# Patient Record
Sex: Female | Born: 1955 | Race: Black or African American | Hispanic: No | Marital: Single | Smoking: Former smoker
Health system: Southern US, Community
[De-identification: ages and names within clinical notes are randomized; demographics above are authoritative.]

## PROBLEM LIST (undated history)

## (undated) DIAGNOSIS — E119 Type 2 diabetes mellitus without complications: Secondary | ICD-10-CM

## (undated) DIAGNOSIS — C50919 Malignant neoplasm of unspecified site of unspecified female breast: Secondary | ICD-10-CM

## (undated) DIAGNOSIS — I639 Cerebral infarction, unspecified: Secondary | ICD-10-CM

## (undated) DIAGNOSIS — I10 Essential (primary) hypertension: Secondary | ICD-10-CM

## (undated) DIAGNOSIS — E785 Hyperlipidemia, unspecified: Secondary | ICD-10-CM

## (undated) HISTORY — PX: BREAST LUMPECTOMY: SHX2

## (undated) HISTORY — DX: Cerebral infarction, unspecified: I63.9

## (undated) HISTORY — DX: Hyperlipidemia, unspecified: E78.5

---

## 2009-07-07 ENCOUNTER — Emergency Department (HOSPITAL_BASED_OUTPATIENT_CLINIC_OR_DEPARTMENT_OTHER): Admission: EM | Admit: 2009-07-07 | Discharge: 2009-07-07 | Payer: Self-pay | Admitting: Emergency Medicine

## 2011-03-06 LAB — POCT CARDIAC MARKERS
CKMB, poc: 1.8 ng/mL (ref 1.0–8.0)
Troponin i, poc: <0.05 ng/mL (ref 0.00–0.09)

## 2011-03-06 LAB — BASIC METABOLIC PANEL
BUN: 10 mg/dL (ref 6–23)
CO2: 29 mEq/L (ref 19–32)
Chloride: 104 mEq/L (ref 96–112)
Glucose, Bld: 159 mg/dL — ABNORMAL HIGH (ref 70–99)
Potassium: 3.5 mEq/L (ref 3.5–5.1)

## 2014-09-10 ENCOUNTER — Emergency Department (HOSPITAL_BASED_OUTPATIENT_CLINIC_OR_DEPARTMENT_OTHER): Payer: Medicare Other

## 2014-09-10 ENCOUNTER — Emergency Department (HOSPITAL_BASED_OUTPATIENT_CLINIC_OR_DEPARTMENT_OTHER)
Admission: EM | Admit: 2014-09-10 | Discharge: 2014-09-10 | Disposition: A | Discharge status: Home / Self Care | Payer: Medicare Other | Attending: Emergency Medicine | Admitting: Emergency Medicine

## 2014-09-10 ENCOUNTER — Encounter (HOSPITAL_BASED_OUTPATIENT_CLINIC_OR_DEPARTMENT_OTHER): Payer: Self-pay | Admitting: Emergency Medicine

## 2014-09-10 DIAGNOSIS — E119 Type 2 diabetes mellitus without complications: Secondary | ICD-10-CM | POA: Diagnosis not present

## 2014-09-10 DIAGNOSIS — G454 Transient global amnesia: Secondary | ICD-10-CM | POA: Diagnosis not present

## 2014-09-10 DIAGNOSIS — Z853 Personal history of malignant neoplasm of breast: Secondary | ICD-10-CM | POA: Insufficient documentation

## 2014-09-10 DIAGNOSIS — G4700 Insomnia, unspecified: Secondary | ICD-10-CM | POA: Insufficient documentation

## 2014-09-10 DIAGNOSIS — R42 Dizziness and giddiness: Secondary | ICD-10-CM | POA: Diagnosis present

## 2014-09-10 DIAGNOSIS — Z87891 Personal history of nicotine dependence: Secondary | ICD-10-CM | POA: Diagnosis not present

## 2014-09-10 HISTORY — DX: Malignant neoplasm of unspecified site of unspecified female breast: C50.919

## 2014-09-10 HISTORY — DX: Type 2 diabetes mellitus without complications: E11.9

## 2014-09-10 LAB — BASIC METABOLIC PANEL
Anion gap: 19 — ABNORMAL HIGH (ref 5–15)
BUN: 14 mg/dL (ref 6–23)
CALCIUM: 10.7 mg/dL — AB (ref 8.4–10.5)
CO2: 24 meq/L (ref 19–32)
CREATININE: 0.9 mg/dL (ref 0.50–1.10)
Chloride: 98 mEq/L (ref 96–112)
GFR calc Af Amer: 80 mL/min — ABNORMAL LOW (ref >90)
GFR calc non Af Amer: 69 mL/min — ABNORMAL LOW (ref >90)
GLUCOSE: 121 mg/dL — AB (ref 70–99)
Potassium: 3.3 mEq/L — ABNORMAL LOW (ref 3.7–5.3)
Sodium: 141 mEq/L (ref 137–147)

## 2014-09-10 LAB — CBC WITH DIFFERENTIAL/PLATELET
BASOS ABS: 0 10*3/uL (ref 0.0–0.1)
Basophils Relative: 0 % (ref 0–1)
EOS PCT: 0 % (ref 0–5)
Eosinophils Absolute: 0 10*3/uL (ref 0.0–0.7)
HEMATOCRIT: 36.1 % (ref 36.0–46.0)
Hemoglobin: 12.5 g/dL (ref 12.0–15.0)
LYMPHS PCT: 24 % (ref 12–46)
Lymphs Abs: 2.8 10*3/uL (ref 0.7–4.0)
MCH: 29.1 pg (ref 26.0–34.0)
MCHC: 34.6 g/dL (ref 30.0–36.0)
MCV: 84.1 fL (ref 78.0–100.0)
MONO ABS: 0.7 10*3/uL (ref 0.1–1.0)
Monocytes Relative: 6 % (ref 3–12)
Neutro Abs: 8 10*3/uL — ABNORMAL HIGH (ref 1.7–7.7)
Neutrophils Relative %: 70 % (ref 43–77)
Platelets: 274 10*3/uL (ref 150–400)
RBC: 4.29 MIL/uL (ref 3.87–5.11)
RDW: 13.9 % (ref 11.5–15.5)
WBC: 11.6 10*3/uL — AB (ref 4.0–10.5)

## 2014-09-10 LAB — URINALYSIS, ROUTINE W REFLEX MICROSCOPIC
BILIRUBIN URINE: NEGATIVE
Glucose, UA: NEGATIVE mg/dL
HGB URINE DIPSTICK: NEGATIVE
Ketones, ur: 15 mg/dL — AB
Leukocytes, UA: NEGATIVE
Nitrite: NEGATIVE
Protein, ur: 30 mg/dL — AB
SPECIFIC GRAVITY, URINE: 1.013 (ref 1.005–1.030)
UROBILINOGEN UA: 0.2 mg/dL (ref 0.0–1.0)
pH: 6.5 (ref 5.0–8.0)

## 2014-09-10 LAB — CBG MONITORING, ED: Glucose-Capillary: 134 mg/dL — ABNORMAL HIGH (ref 70–99)

## 2014-09-10 LAB — URINE MICROSCOPIC-ADD ON

## 2014-09-10 LAB — TROPONIN I: Troponin I: <0.3 ng/mL (ref <0.30)

## 2014-09-10 IMAGING — CT CT HEAD W/O CM
1 series · 16 of 30 positions shown, 20 images · non-contrast
Comparison: None.

CLINICAL DATA: Weakness

EXAM:
CT HEAD WITHOUT CONTRAST
TECHNIQUE: Contiguous axial images were obtained from the base of the skull
through the vertex without intravenous contrast.

[Series 3: head 4.8 h37s · axial · 0.45mm/px · z∈[-141,+11]mm · 16 of 36 slices shown, 20 images]
[im 2/36  brain]
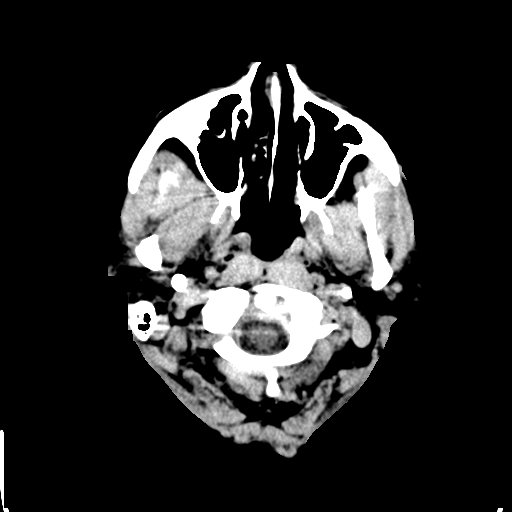
[im 2/36  bone]
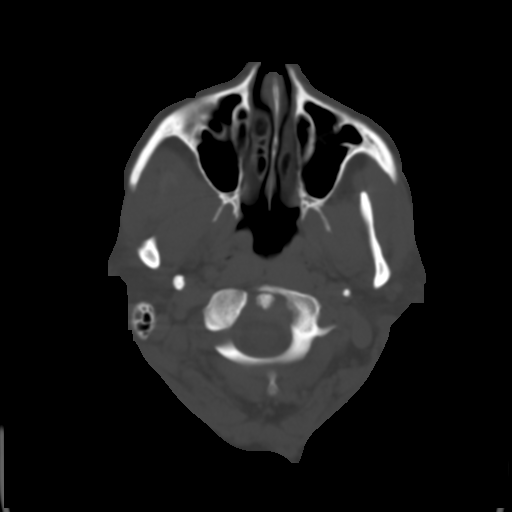
[im 4/36  brain]
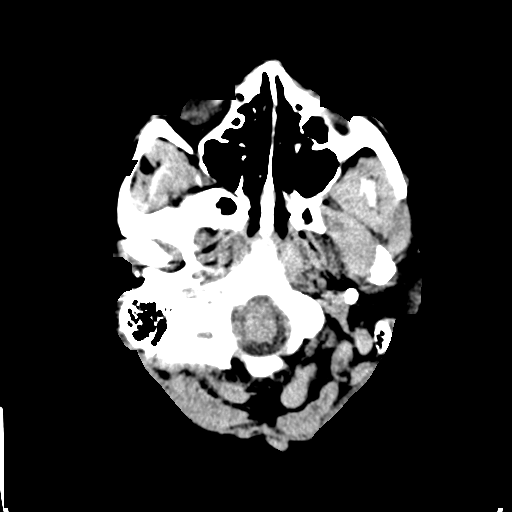
[im 7/36  brain]
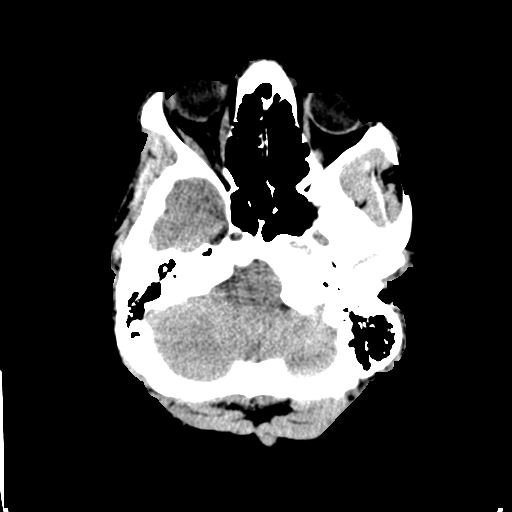
[im 9/36  brain]
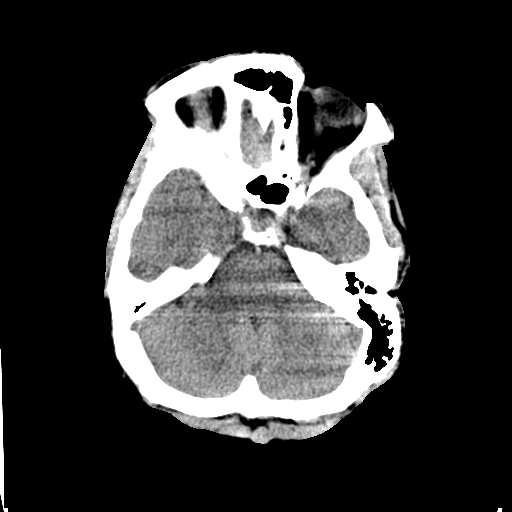
[im 10/36  brain]
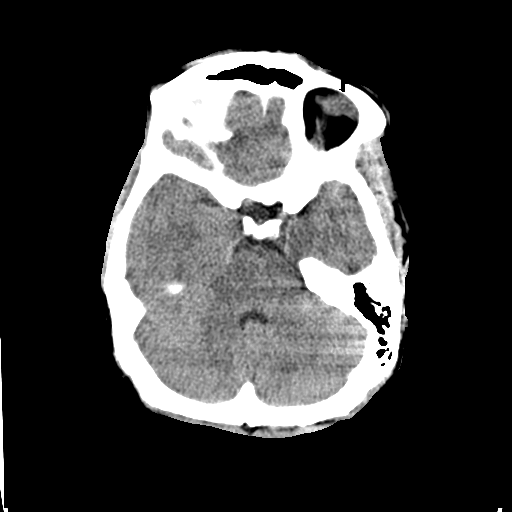
[im 10/36  bone]
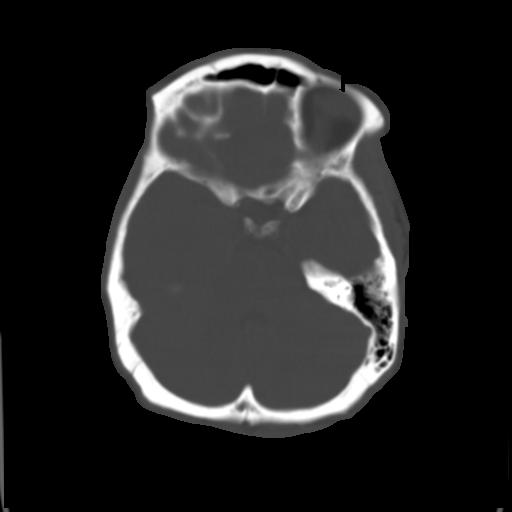
[im 13/36  brain]
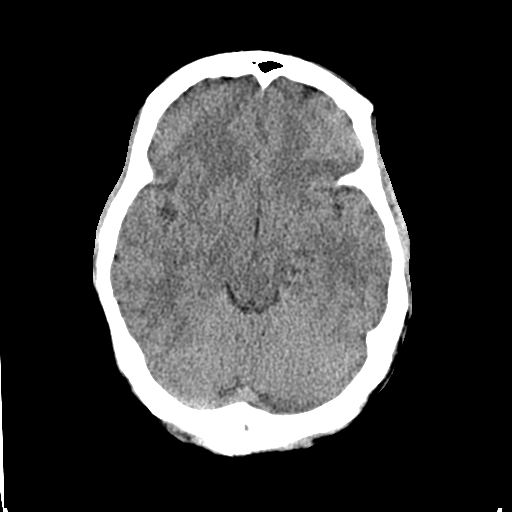
[im 15/36  brain]
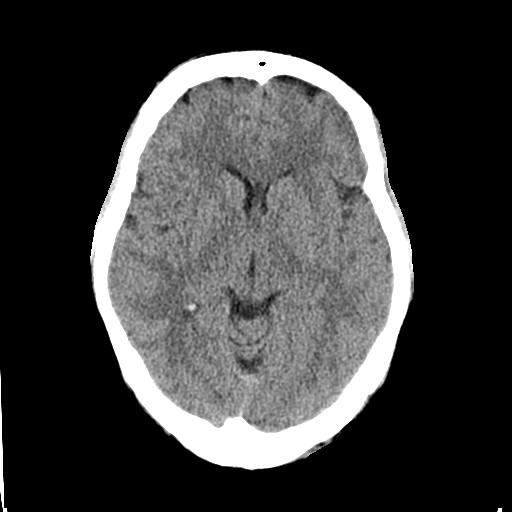
[im 17/36  brain]
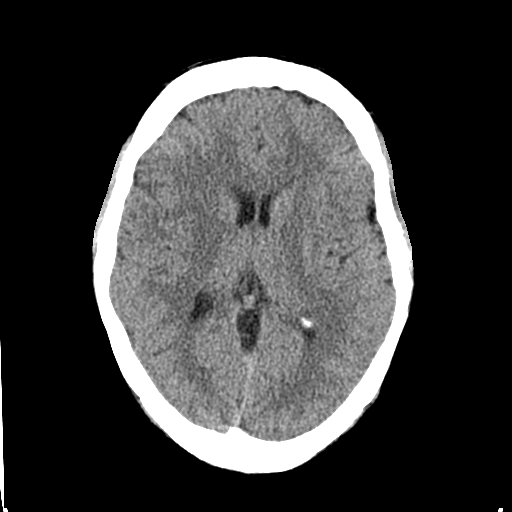
[im 19/36  brain]
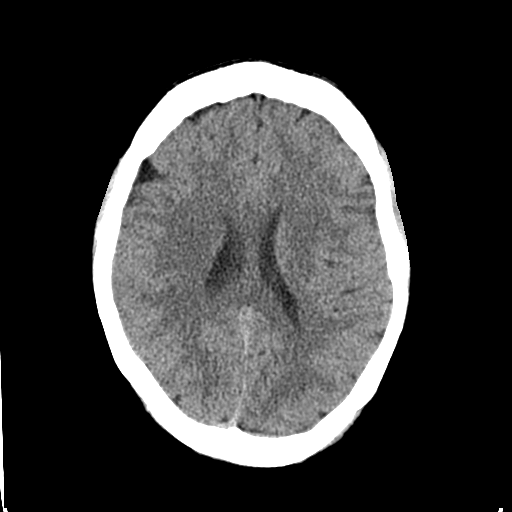
[im 19/36  bone]
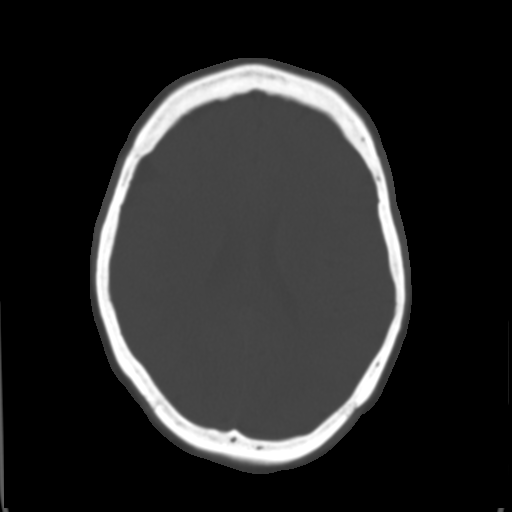
[im 21/36  brain]
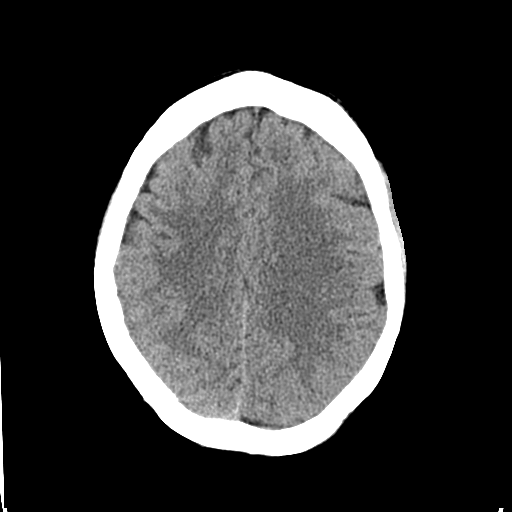
[im 23/36  brain]
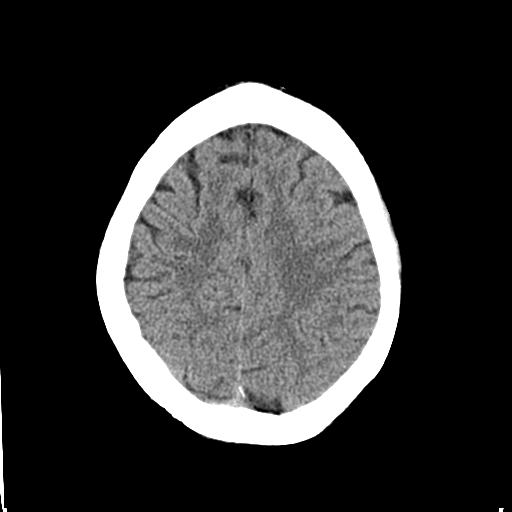
[im 26/36  brain]
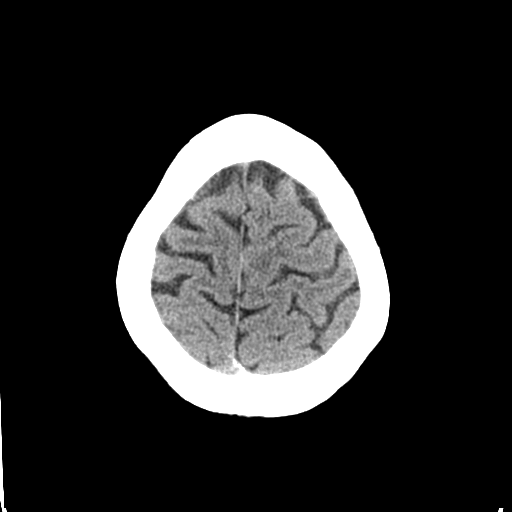
[im 27/36  brain]
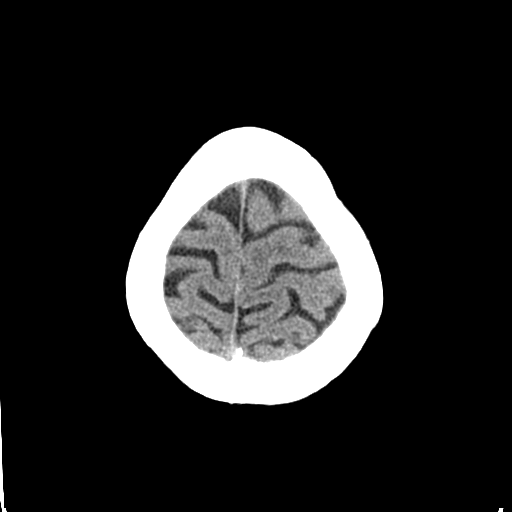
[im 27/36  bone]
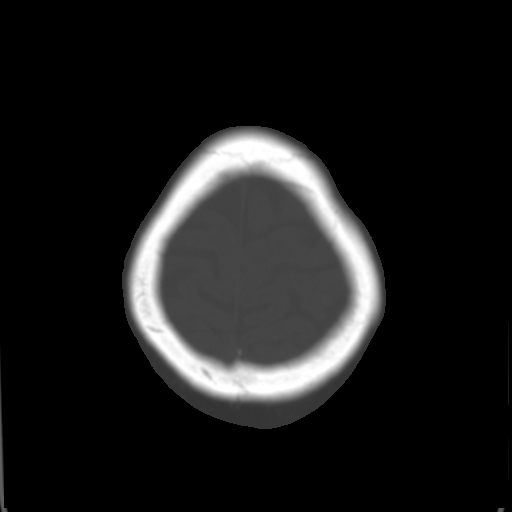
[im 29/36  brain]
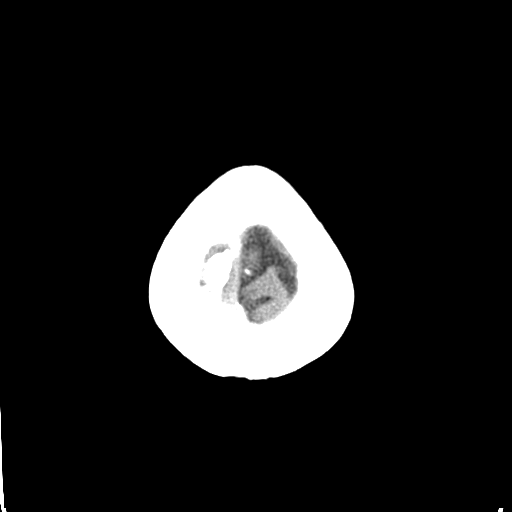
[im 32/36  brain]
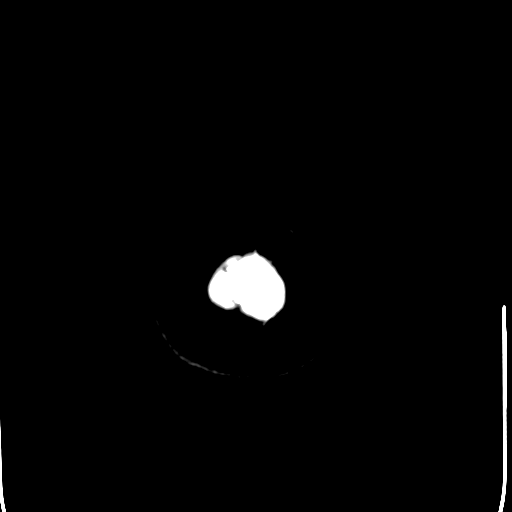
[im 34/36  brain]
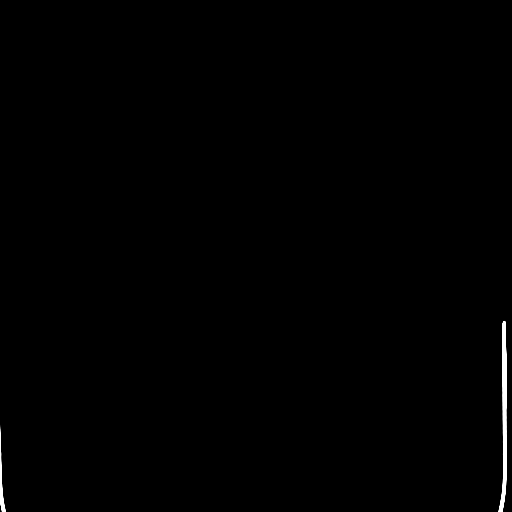

[16 of 30 positions shown; findings below may reference images not displayed]

FINDINGS: No acute cortical infarct, hemorrhage, or mass lesion is present.
The ventricles are of normal size. No significant extra-axial fluid
collection is evident. The paranasal sinuses and mastoid air cells
are clear. The osseous skull is intact.
IMPRESSION: Negative CT of the head.

## 2014-09-10 MED ORDER — ESZOPICLONE 1 MG PO TABS: 1.0000 mg | ORAL_TABLET | Freq: Every evening | ORAL | Status: DC | PRN

## 2014-09-10 NOTE — ED Notes (Addendum)
C/o "feeling woozy" x 4 days-steady gait into triage-denies pain-pt with steady gait into triage and answering all ?s

## 2014-09-10 NOTE — ED Notes (Signed)
Patient transported to CT 

## 2014-09-10 NOTE — Discharge Instructions (Signed)
°Insomnia °Insomnia is frequent trouble falling and/or staying asleep. Insomnia can be a long term problem or a short term problem. Both are common. Insomnia can be a short term problem when the wakefulness is related to a certain stress or worry. Long term insomnia is often related to ongoing stress during waking hours and/or poor sleeping habits. Overtime, sleep deprivation itself can make the problem worse. Every little thing feels more severe because you are overtired and your ability to cope is decreased. °CAUSES  °· Stress, anxiety, and depression. °· Poor sleeping habits. °· Distractions such as TV in the bedroom. °· Naps close to bedtime. °· Engaging in emotionally charged conversations before bed. °· Technical reading before sleep. °· Alcohol and other sedatives. They may make the problem worse. They can hurt normal sleep patterns and normal dream activity. °· Stimulants such as caffeine for several hours prior to bedtime. °· Pain syndromes and shortness of breath can cause insomnia. °· Exercise late at night. °· Changing time zones may cause sleeping problems (jet lag). °It is sometimes helpful to have someone observe your sleeping patterns. They should look for periods of not breathing during the night (sleep apnea). They should also look to see how long those periods last. If you live alone or observers are uncertain, you can also be observed at a sleep clinic where your sleep patterns will be professionally monitored. Sleep apnea requires a checkup and treatment. Give your caregivers your medical history. Give your caregivers observations your family has made about your sleep.  °SYMPTOMS  °· Not feeling rested in the morning. °· Anxiety and restlessness at bedtime. °· Difficulty falling and staying asleep. °TREATMENT  °· Your caregiver may prescribe treatment for an underlying medical disorders. Your caregiver can give advice or help if you are using alcohol or other drugs for self-medication.  Treatment of underlying problems will usually eliminate insomnia problems. °· Medications can be prescribed for short time use. They are generally not recommended for lengthy use. °· Over-the-counter sleep medicines are not recommended for lengthy use. They can be habit forming. °· You can promote easier sleeping by making lifestyle changes such as: °¨ Using relaxation techniques that help with breathing and reduce muscle tension. °¨ Exercising earlier in the day. °¨ Changing your diet and the time of your last meal. No night time snacks. °¨ Establish a regular time to go to bed. °· Counseling can help with stressful problems and worry. °· Soothing music and white noise may be helpful if there are background noises you cannot remove. °· Stop tedious detailed work at least one hour before bedtime. °HOME CARE INSTRUCTIONS  °· Keep a diary. Inform your caregiver about your progress. This includes any medication side effects. See your caregiver regularly. Take note of: °¨ Times when you are asleep. °¨ Times when you are awake during the night. °¨ The quality of your sleep. °¨ How you feel the next day. °This information will help your caregiver care for you. °· Get out of bed if you are still awake after 15 minutes. Read or do some quiet activity. Keep the lights down. Wait until you feel sleepy and go back to bed. °· Keep regular sleeping and waking hours. Avoid naps. °· Exercise regularly. °· Avoid distractions at bedtime. Distractions include watching television or engaging in any intense or detailed activity like attempting to balance the household checkbook. °· Develop a bedtime ritual. Keep a familiar routine of bathing, brushing your teeth, climbing into bed at the same   time each night, listening to soothing music. Routines increase the success of falling to sleep faster.  Use relaxation techniques. This can be using breathing and muscle tension release routines. It can also include visualizing peaceful scenes. You can  also help control troubling or intruding thoughts by keeping your mind occupied with boring or repetitive thoughts like the old concept of counting sheep. You can make it more creative like imagining planting one beautiful flower after another in your backyard garden.  During your day, work to eliminate stress. When this is not possible use some of the previous suggestions to help reduce the anxiety that accompanies stressful situations. MAKE SURE YOU:   Understand these instructions.  Will watch your condition.  Will get help right away if you are not doing well or get worse. Document Released: 11/11/2000 Document Revised: 02/06/2012 Document Reviewed: 12/12/2007 [facility] Patient Information 2015 [place], [place]. This information is not intended to replace advice given to you by your health care provider. Make sure you discuss any questions you have with your health care provider.  Transient Global Amnesia Your exam shows you may have a rare problem that causes temporary amnesia, an inability to remember what has happened in the past several hours or day. Transient global amnesia (TGA) means you cannot remember recent events, even though you may look and act normally. There are no physical problems in TGA; your vision, strength, coordination, and sensations are all normal. TGA occurs most often in older patients, and in patients with high blood pressure. The exact cause of TGA is not known, although it is thought to be due to vascular disease in your brain. There is usually a complete return to normal memory capacity after an episode is over. About 20-30% of patients with TGA will have more than one episode, and some studies show a slight increased risk for stroke. Although no special treatment is needed, taking up to one adult aspirin daily reduces the risk of having a stroke. You should consider taking aspirin daily if you are not allergic to it. Medical evaluation may require specialized scans to  check for stroke or other brain problems, an EEG (brain wave test), or blood tests. Avoid alcohol or any sedating medicines until you are completely recovered. Call your doctor right away if your memory is not fully recovered after 24 hours, or if you have any other serious problems including:  Severe headache, nausea, vomiting, fever, or other symptoms of an infection.  Weakness, numbness, difficulty with movement, or incoordination.  Blurred or double vision, unusual sleepiness, seizures, or fainting. Document Released: 12/22/2004 Document Revised: 02/06/2012 Document Reviewed: 11/14/2005 [facility] Patient Information 2015 [place], [place]. This information is not intended to replace advice given to you by your health care provider. Make sure you discuss any questions you have with your health care provider.

## 2014-09-10 NOTE — ED Notes (Signed)
MD at bedside, discussing POC with patient and family

## 2014-09-11 NOTE — ED Provider Notes (Signed)
CSN: [id number]     Arrival date & time 09/10/14  1417 History   First MD Initiated Contact with Patient 09/10/14 1502     Chief Complaint  Patient presents with  . Dizziness      HPI  Presents with stating that "she is acting funny". Since her stent trouble with memory since yesterday and today. Cannot remember yesterday morning to call her son to go to the bank with her. With the back of her mother but did not recall this several hours later. Last night could not remember much of what she had done done during the day. Symptoms continued today with difficulty with short-term memory. No Facial droop or difficulty with speech or swallowing. No disuse of extremities. She is ambulatory.  Family states she had an episode about 6 months ago where she had 4 or 5 minutes and insomnia and was admitted and treated at [facility] regional similar symptoms and resolved. She had a normal MRI at that time.  Past Medical History  Diagnosis Date  . Diabetes mellitus without complication   . Breast cancer    Past Surgical History  Procedure Laterality Date  . Breast lumpectomy     No family history on file. History  Substance Use Topics  . Smoking status: Former Research scientist (life sciences)  . Smokeless tobacco: Not on file  . Alcohol Use: No   OB History   Grav Para Term Preterm Abortions TAB SAB Ect Mult Living                 Review of Systems  Constitutional: Negative for fever, chills, diaphoresis, appetite change and fatigue.  HENT: Negative for mouth sores, sore throat and trouble swallowing.   Eyes: Negative for visual disturbance.  Respiratory: Negative for cough, chest tightness, shortness of breath and wheezing.   Cardiovascular: Negative for chest pain.  Gastrointestinal: Negative for nausea, vomiting, abdominal pain, diarrhea and abdominal distention.  Endocrine: Negative for polydipsia, polyphagia and polyuria.  Genitourinary: Negative for dysuria, frequency and hematuria.  Musculoskeletal:  Negative for gait problem.  Skin: Negative for color change, pallor and rash.  Neurological: Negative for dizziness, syncope, light-headedness and headaches.       Difficult memory  Hematological: Does not bruise/bleed easily.  Psychiatric/Behavioral: Negative for behavioral problems and confusion.      Allergies  Other  Home Medications   Prior to Admission medications   Medication Sig Start Date End Date Taking? Authorizing Provider  eszopiclone (LUNESTA) 1 MG TABS tablet Take 1 tablet (1 mg total) by mouth at bedtime as needed for sleep. Take immediately before bedtime 09/10/14   Tanna Furry, MD  GLIPIZIDE PO Take by mouth.   Yes Historical Provider, MD  HYDRALAZINE-HCTZ PO Take by mouth.   Yes Historical Provider, MD  LOSARTAN POTASSIUM PO Take by mouth.   Yes Historical Provider, MD  UNKNOWN TO PATIENT Cancer med   Yes Historical Provider, MD   BP 136/72  Pulse 86  Temp(Src) 98 F (36.7 C) (Oral)  Resp 20  Ht 5\' 2"  (1.575 m)  Wt 215 lb (97.523 kg)  BMI 39.31 kg/m2  SpO2 100% Physical Exam  Constitutional: She is oriented to person, place, and time. She appears well-developed and well-nourished. No distress.  HENT:  Head: Normocephalic.  Eyes: Conjunctivae are normal. Pupils are equal, round, and reactive to light. No scleral icterus.  Neck: Normal range of motion. Neck supple. No thyromegaly present.  Cardiovascular: Normal rate and regular rhythm.  Exam reveals no gallop  and no friction rub.   No murmur heard. Pulmonary/Chest: Effort normal and breath sounds normal. No respiratory distress. She has no wheezes. She has no rales.  Abdominal: Soft. Bowel sounds are normal. She exhibits no distension. There is no tenderness. There is no rebound.  Musculoskeletal: Normal range of motion.  Neurological: She is alert and oriented to person, place, and time.  Cranial nerves intact. Normal strength and sensation fortunately normal gait. She has almost no immediate recall her  short-term memory. Initially could not tell me where she was at, the date, the president, or the year. She recall her address, her name, and her relatives.  Skin: Skin is warm and dry. No rash noted.  Psychiatric: She has a normal mood and affect. Her behavior is normal.    ED Course  Procedures (including critical care time) Labs Review Labs Reviewed  BASIC METABOLIC PANEL - Abnormal; Notable for the following:    Potassium 3.3 (*)    Glucose, Bld 121 (*)    Calcium 10.7 (*)    GFR calc non Af Amer 69 (*)    GFR calc Af Amer 80 (*)    Anion gap 19 (*)    All other components within normal limits  CBC WITH DIFFERENTIAL - Abnormal; Notable for the following:    WBC 11.6 (*)    Neutro Abs 8.0 (*)    All other components within normal limits  URINALYSIS, ROUTINE W REFLEX MICROSCOPIC - Abnormal; Notable for the following:    Ketones, ur 15 (*)    Protein, ur 30 (*)    All other components within normal limits  CBG MONITORING, ED - Abnormal; Notable for the following:    Glucose-Capillary 134 (*)    All other components within normal limits  TROPONIN I  URINE MICROSCOPIC-ADD ON    Imaging Review Ct Head Wo Contrast  09/10/2014   CLINICAL DATA:  Weakness  EXAM: CT HEAD WITHOUT CONTRAST  TECHNIQUE: Contiguous axial images were obtained from the base of the skull through the vertex without intravenous contrast.  COMPARISON:  None.  FINDINGS: No acute cortical infarct, hemorrhage, or mass lesion is present. The ventricles are of normal size. No significant extra-axial fluid collection is evident. The paranasal sinuses and mastoid air cells are clear. The osseous skull is intact.  IMPRESSION: Negative CT of the head.   Electronically Signed   By: Lawrence Santiago M.D.   On: 09/10/2014 16:14     EKG Interpretation None      MDM   Final diagnoses:  Transient global amnesia  Insomnia      Patient was regaining her memory almost completely on recheck.  On recheck is able to tell  me her name the date the president the year and her location. She  slept intermittently here. I reviewed her records from her visit at [facility] regional which showed a very similar presentation in April of this year. I think her diagnosis at this time is transient global amnesia and insomnia. I did discuss the case with our neurologist on call. With my description and a previous episode it was agreed that no additional interventions are imaging was necessary tonight with her symptoms resolved. Given her a prescription for Lunesta use at night. Given her a referral to neurology as well.    Tanna Furry, MD 09/11/14 0010

## 2014-11-21 ENCOUNTER — Encounter (HOSPITAL_BASED_OUTPATIENT_CLINIC_OR_DEPARTMENT_OTHER): Payer: Self-pay | Admitting: *Deleted

## 2014-11-21 ENCOUNTER — Emergency Department (HOSPITAL_BASED_OUTPATIENT_CLINIC_OR_DEPARTMENT_OTHER)
Admission: EM | Admit: 2014-11-21 | Discharge: 2014-11-21 | Disposition: A | Discharge status: Home / Self Care | Payer: Medicare Other | Attending: Emergency Medicine | Admitting: Emergency Medicine

## 2014-11-21 ENCOUNTER — Emergency Department (HOSPITAL_BASED_OUTPATIENT_CLINIC_OR_DEPARTMENT_OTHER): Payer: Medicare Other

## 2014-11-21 DIAGNOSIS — I10 Essential (primary) hypertension: Secondary | ICD-10-CM | POA: Diagnosis not present

## 2014-11-21 DIAGNOSIS — E119 Type 2 diabetes mellitus without complications: Secondary | ICD-10-CM | POA: Insufficient documentation

## 2014-11-21 DIAGNOSIS — Z853 Personal history of malignant neoplasm of breast: Secondary | ICD-10-CM | POA: Insufficient documentation

## 2014-11-21 DIAGNOSIS — Z87891 Personal history of nicotine dependence: Secondary | ICD-10-CM | POA: Insufficient documentation

## 2014-11-21 DIAGNOSIS — F419 Anxiety disorder, unspecified: Secondary | ICD-10-CM | POA: Diagnosis not present

## 2014-11-21 DIAGNOSIS — R4182 Altered mental status, unspecified: Secondary | ICD-10-CM | POA: Diagnosis present

## 2014-11-21 DIAGNOSIS — R410 Disorientation, unspecified: Secondary | ICD-10-CM

## 2014-11-21 HISTORY — DX: Essential (primary) hypertension: I10

## 2014-11-21 LAB — URINALYSIS, ROUTINE W REFLEX MICROSCOPIC
BILIRUBIN URINE: NEGATIVE
Glucose, UA: NEGATIVE mg/dL
Hgb urine dipstick: NEGATIVE
KETONES UR: 15 mg/dL — AB
Leukocytes, UA: NEGATIVE
NITRITE: NEGATIVE
PH: 5.5 (ref 5.0–8.0)
Protein, ur: 100 mg/dL — AB
Specific Gravity, Urine: 1.028 (ref 1.005–1.030)
UROBILINOGEN UA: 0.2 mg/dL (ref 0.0–1.0)

## 2014-11-21 LAB — URINE MICROSCOPIC-ADD ON

## 2014-11-21 LAB — COMPREHENSIVE METABOLIC PANEL
ALK PHOS: 60 U/L (ref 39–117)
ALT: 24 U/L (ref 0–35)
ANION GAP: 8 (ref 5–15)
AST: 29 U/L (ref 0–37)
Albumin: 4.1 g/dL (ref 3.5–5.2)
BILIRUBIN TOTAL: 0.9 mg/dL (ref 0.3–1.2)
BUN: 24 mg/dL — AB (ref 6–23)
CHLORIDE: 112 meq/L (ref 96–112)
CO2: 22 mmol/L (ref 19–32)
Calcium: 9.5 mg/dL (ref 8.4–10.5)
Creatinine, Ser: 1.1 mg/dL (ref 0.50–1.10)
GFR calc non Af Amer: 54 mL/min — ABNORMAL LOW (ref >90)
GFR, EST AFRICAN AMERICAN: 63 mL/min — AB (ref >90)
GLUCOSE: 174 mg/dL — AB (ref 70–99)
POTASSIUM: 3 mmol/L — AB (ref 3.5–5.1)
Sodium: 142 mmol/L (ref 135–145)
Total Protein: 7.4 g/dL (ref 6.0–8.3)

## 2014-11-21 LAB — CBC WITH DIFFERENTIAL/PLATELET
Basophils Absolute: 0 10*3/uL (ref 0.0–0.1)
Basophils Relative: 0 % (ref 0–1)
Eosinophils Absolute: 0 10*3/uL (ref 0.0–0.7)
Eosinophils Relative: 0 % (ref 0–5)
HCT: 34.8 % — ABNORMAL LOW (ref 36.0–46.0)
HEMOGLOBIN: 11.9 g/dL — AB (ref 12.0–15.0)
LYMPHS ABS: 2.9 10*3/uL (ref 0.7–4.0)
Lymphocytes Relative: 25 % (ref 12–46)
MCH: 29.3 pg (ref 26.0–34.0)
MCHC: 34.2 g/dL (ref 30.0–36.0)
MCV: 85.7 fL (ref 78.0–100.0)
MONOS PCT: 8 % (ref 3–12)
Monocytes Absolute: 0.9 10*3/uL (ref 0.1–1.0)
NEUTROS ABS: 7.7 10*3/uL (ref 1.7–7.7)
NEUTROS PCT: 67 % (ref 43–77)
Platelets: 257 10*3/uL (ref 150–400)
RBC: 4.06 MIL/uL (ref 3.87–5.11)
RDW: 14.3 % (ref 11.5–15.5)
WBC: 11.5 10*3/uL — ABNORMAL HIGH (ref 4.0–10.5)

## 2014-11-21 LAB — SALICYLATE LEVEL: Salicylate Lvl: <4 mg/dL (ref 2.8–20.0)

## 2014-11-21 LAB — RAPID URINE DRUG SCREEN, HOSP PERFORMED
Amphetamines: NOT DETECTED
BARBITURATES: NOT DETECTED
BENZODIAZEPINES: NOT DETECTED
Cocaine: NOT DETECTED
Opiates: NOT DETECTED
TETRAHYDROCANNABINOL: NOT DETECTED

## 2014-11-21 LAB — CBG MONITORING, ED: GLUCOSE-CAPILLARY: 147 mg/dL — AB (ref 70–99)

## 2014-11-21 LAB — ETHANOL

## 2014-11-21 LAB — ACETAMINOPHEN LEVEL: Acetaminophen (Tylenol), Serum: <10 ug/mL — ABNORMAL LOW (ref 10–30)

## 2014-11-21 IMAGING — CT CT HEAD W/O CM
1 of 2 series · 16 of 30 positions shown, 20 images · non-contrast
Comparison: [DATE]

CLINICAL DATA: Awoke today with confusion, has not been sleeping
well, history hypertension, diabetes mellitus, breast cancer

EXAM:
CT HEAD WITHOUT CONTRAST
TECHNIQUE: Contiguous axial images were obtained from the base of the skull
through the vertex without intravenous contrast.

[Series 2: head 4.8 h37s · axial · 0.46mm/px · z∈[-242,-102]mm · 16 of 32 slices shown, 20 images]
[im 2/32  brain]
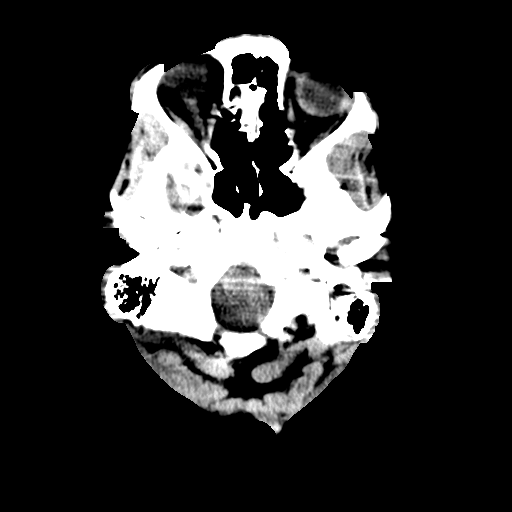
[im 2/32  bone]
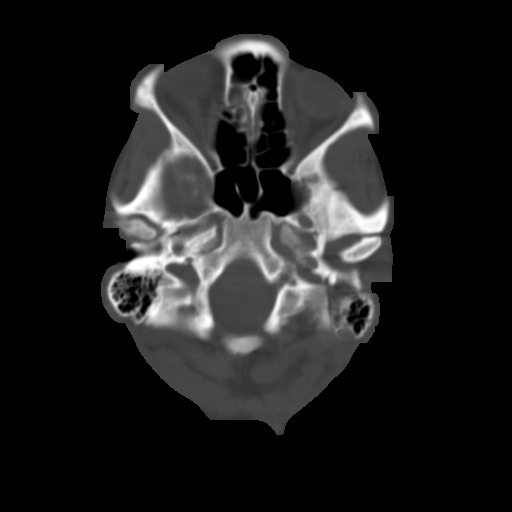
[im 3/32  brain]
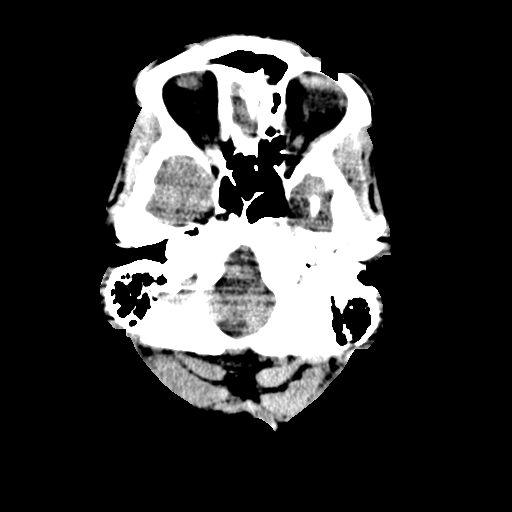
[im 6/32  brain]
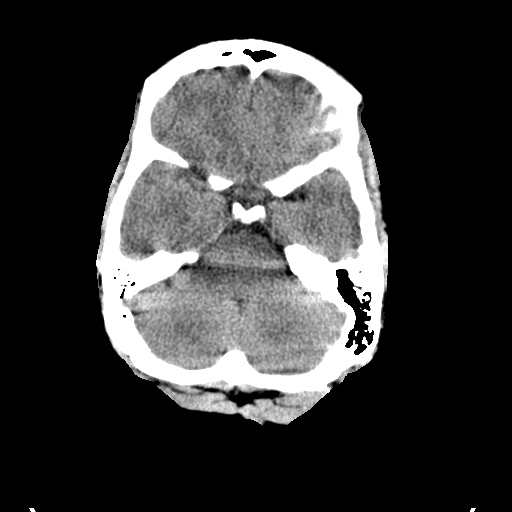
[im 8/32  brain]
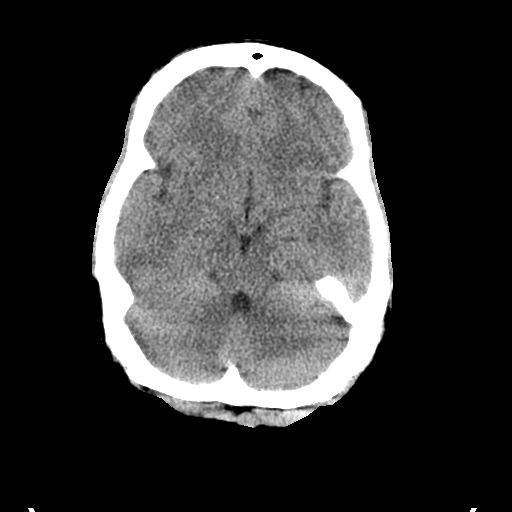
[im 9/32  brain]
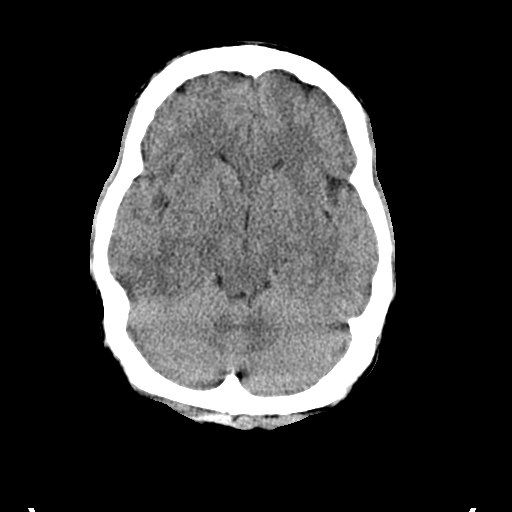
[im 9/32  bone]
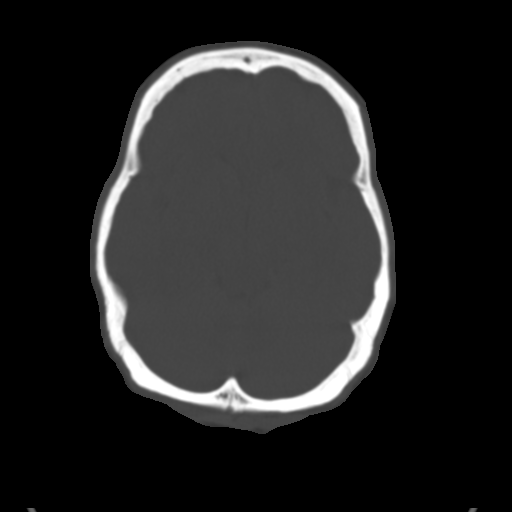
[im 11/32  brain]
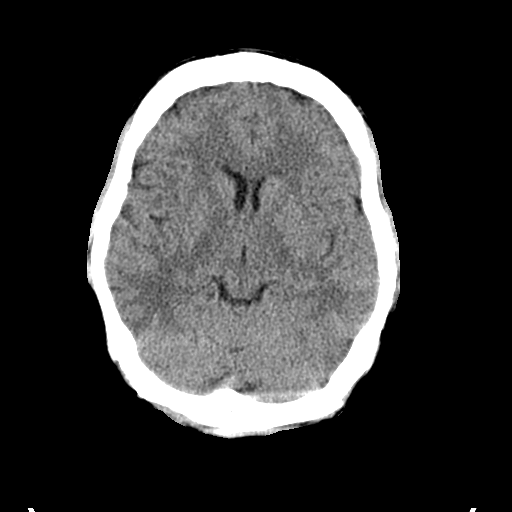
[im 14/32  brain]
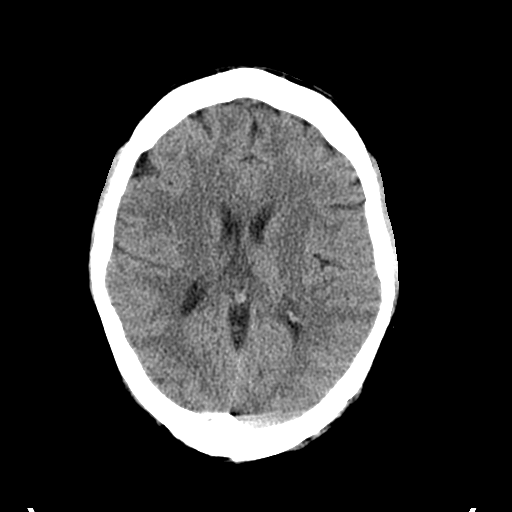
[im 15/32  brain]
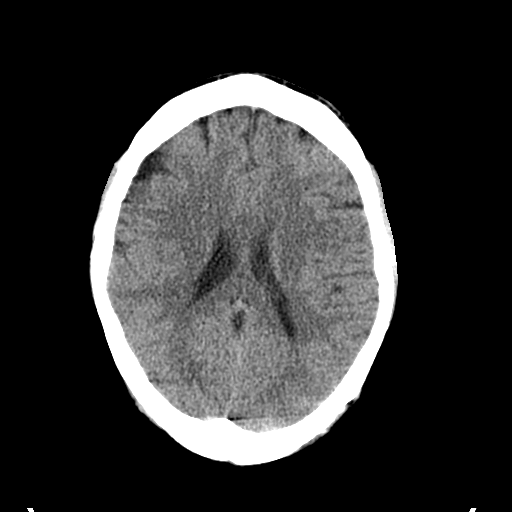
[im 17/32  brain]
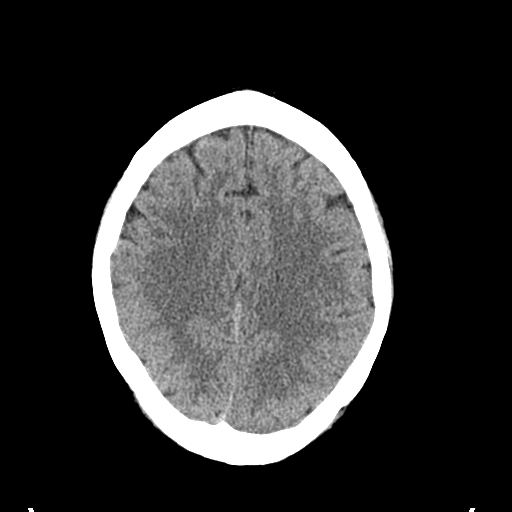
[im 17/32  bone]
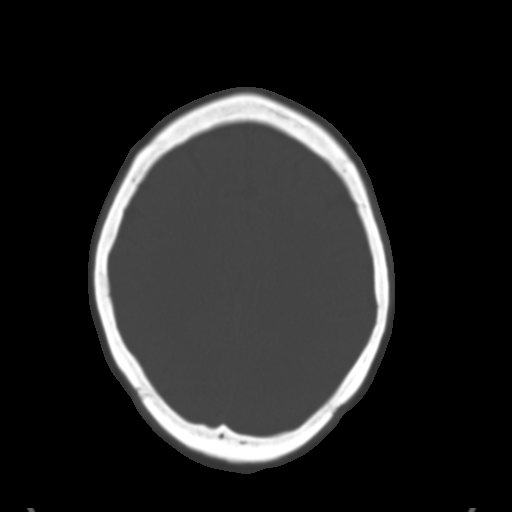
[im 18/32  brain]
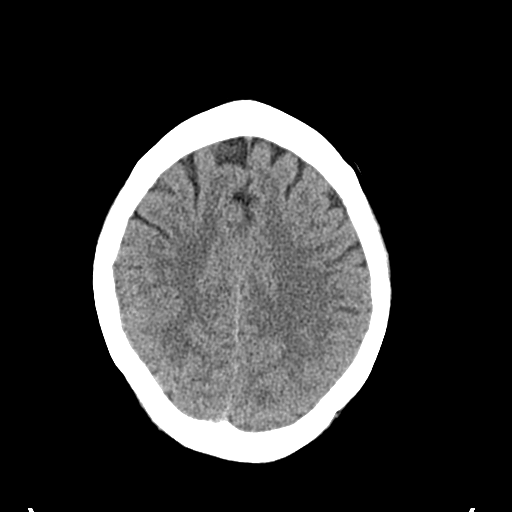
[im 21/32  brain]
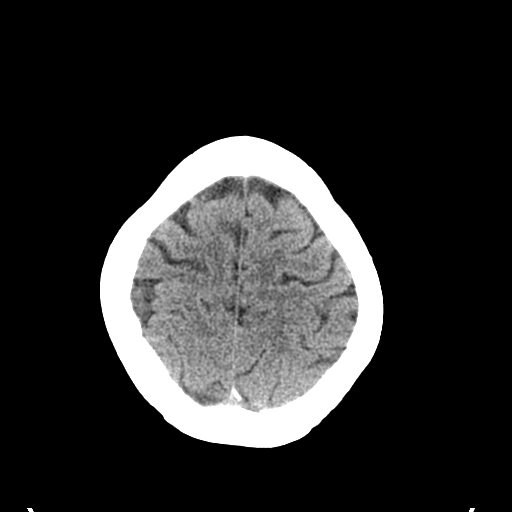
[im 23/32  brain]
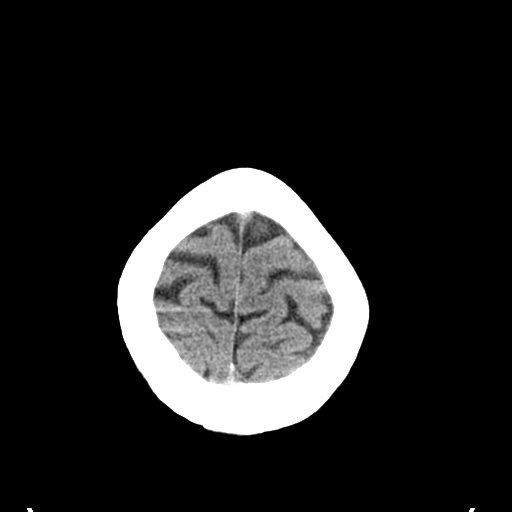
[im 24/32  brain]
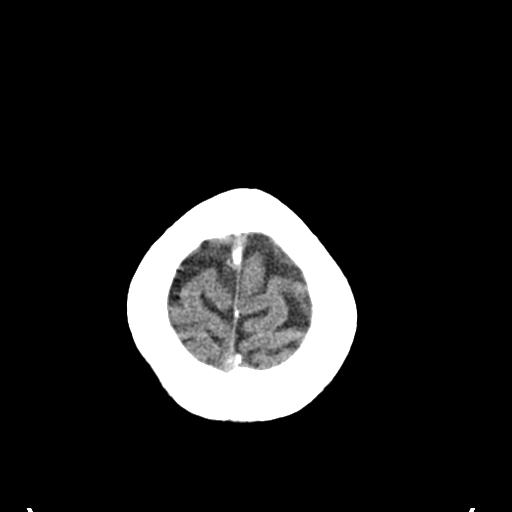
[im 24/32  bone]
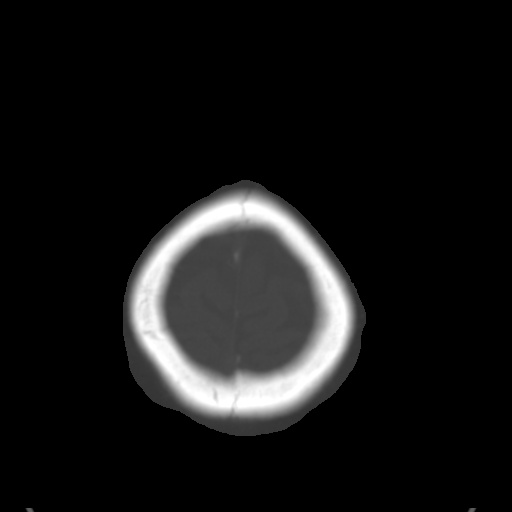
[im 26/32  brain]
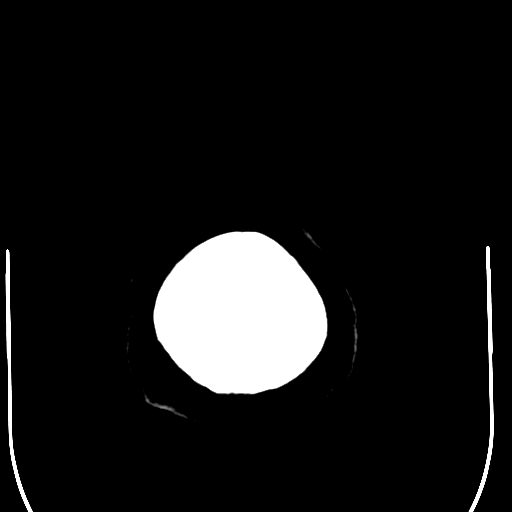
[im 29/32  brain]
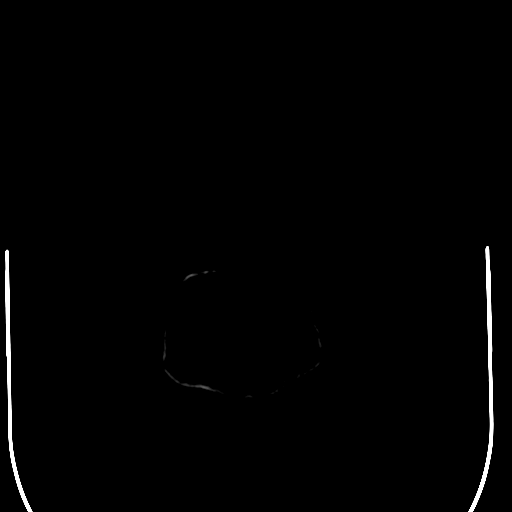
[im 30/32  brain]
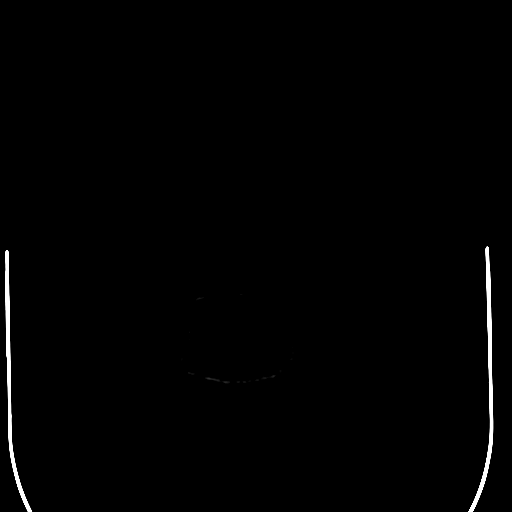

[16 of 30 positions shown; findings below may reference images not displayed]

FINDINGS: Motion artifacts despite repeating imaging.

Normal ventricular morphology.

No midline shift or mass effect.

Within limitations of motion, no gross intracranial hemorrhage, mass
lesion or evidence acute infarction.

No extra-axial fluid collections.

Bones and sinuses unremarkable.
IMPRESSION: No definite acute intracranial abnormalities identified on exam
mildly limited by patient motion.

## 2014-11-21 MED ORDER — POTASSIUM CHLORIDE CRYS ER 20 MEQ PO TBCR
40.0000 meq | EXTENDED_RELEASE_TABLET | Freq: Once | ORAL | Status: AC
  Administered 2014-11-21: 40 meq via ORAL
  Filled 2014-11-21: qty 2

## 2014-11-21 MED ORDER — LORAZEPAM 2 MG/ML IJ SOLN
1.0000 mg | Freq: Once | INTRAMUSCULAR | Status: AC
  Administered 2014-11-21: 1 mg via INTRAVENOUS
  Filled 2014-11-21: qty 1

## 2014-11-21 NOTE — ED Notes (Addendum)
Pt walked to BR with staff and pt comes back she is hyperventilating. Pt instructed to slow breathing down when talked to she does, but goes right back to hyperventilating. Will make MD aware.

## 2014-11-21 NOTE — ED Provider Notes (Signed)
CSN: [id number]     Arrival date & time 11/21/14  1208 History   First MD Initiated Contact with Patient 11/21/14 1239     Chief Complaint  Patient presents with  . Altered Mental Status     (Consider location/radiation/quality/duration/timing/severity/associated sxs/prior Treatment) The history is provided by the patient and a relative.  Monica Bauer is a 58 y.o. female hx of DM, HTN here with confusion and lack of sleep. She has been staying with her mother for the last several days. She has been not sleeping very well. As per her son, she woke up this morning more confused. She denies any fall or head injury. Denies any thoughts of harming herself or hallucinations or racing thoughts. She had similar episode several months ago and was diagnosed with transient global amnesia. She has a neurologist that she follows up with. She also had a normal CT head at that time an MRI earlier this year for similar symptoms. As per the son she has these episodes when she is not sleeping well.   Past Medical History  Diagnosis Date  . Diabetes mellitus without complication   . Breast cancer   . Hypertension    Past Surgical History  Procedure Laterality Date  . Breast lumpectomy     No family history on file. History  Substance Use Topics  . Smoking status: Former Research scientist (life sciences)  . Smokeless tobacco: Not on file  . Alcohol Use: No   OB History    No data available     Review of Systems  Neurological:       Confusion   All other systems reviewed and are negative.     Allergies  Other  Home Medications   Prior to Admission medications   Medication Sig Start Date End Date Taking? Authorizing Provider  GLIPIZIDE PO Take by mouth.   Yes Historical Provider, MD  HYDRALAZINE-HCTZ PO Take by mouth.   Yes Historical Provider, MD  LOSARTAN POTASSIUM PO Take by mouth.   Yes Historical Provider, MD  eszopiclone (LUNESTA) 1 MG TABS tablet Take 1 tablet (1 mg total) by mouth at bedtime as  needed for sleep. Take immediately before bedtime 09/10/14   Tanna Furry, MD  UNKNOWN TO PATIENT Cancer med    Historical Provider, MD   BP 153/95 mmHg  Pulse 117  Temp(Src) 98.9 F (37.2 C) (Oral)  Resp 40  Ht 5\' 2"  (1.575 m)  SpO2 100% Physical Exam  Constitutional: She is oriented to person, place, and time. She appears well-developed and well-nourished.  HENT:  Head: Normocephalic.  Mouth/Throat: Oropharynx is clear and moist.  Eyes: Conjunctivae and EOM are normal. Pupils are equal, round, and reactive to light.  Neck: Normal range of motion. Neck supple.  Cardiovascular: Normal rate, regular rhythm and normal heart sounds.   Pulmonary/Chest: Effort normal and breath sounds normal. No respiratory distress. She has no wheezes. She has no rales.  Abdominal: Soft. Bowel sounds are normal. She exhibits no distension. There is no tenderness. There is no rebound and no guarding.  Musculoskeletal: Normal range of motion. She exhibits no edema or tenderness.  Neurological: She is alert and oriented to person, place, and time. No cranial nerve deficit. Coordination normal.  Alert and oriented. Nl strength throughout, CN 2-12 intact. No pronator drift. Nl finger to nose   Skin: Skin is warm and dry.  Psychiatric: She has a normal mood and affect. Her behavior is normal. Judgment and thought content normal.  Nursing note and vitals  reviewed.   ED Course  Procedures (including critical care time) Labs Review Labs Reviewed  CBC WITH DIFFERENTIAL - Abnormal; Notable for the following:    WBC 11.5 (*)    Hemoglobin 11.9 (*)    HCT 34.8 (*)    All other components within normal limits  COMPREHENSIVE METABOLIC PANEL - Abnormal; Notable for the following:    Potassium 3.0 (*)    Glucose, Bld 174 (*)    BUN 24 (*)    GFR calc non Af Amer 54 (*)    GFR calc Af Amer 63 (*)    All other components within normal limits  CBG MONITORING, ED - Abnormal; Notable for the following:     Glucose-Capillary 147 (*)    All other components within normal limits  URINALYSIS, ROUTINE W REFLEX MICROSCOPIC  URINE RAPID DRUG SCREEN (HOSP PERFORMED)  ETHANOL  SALICYLATE LEVEL  ACETAMINOPHEN LEVEL    Imaging Review No results found.   EKG Interpretation None      MDM   Final diagnoses:  Confusion    Monica Bauer is a 58 y.o. female here with confusion that resolved. Likely related to sleep deprivation. No signs of psychiatric diseases and patient back to baseline with nonfocal neuro exam. Likely transient global amnesia from sleep deprivation. Will check labs and UA and will re prescribe lunesta.   3 pm I was called into the room because patient became disoriented again. She was hyperventilating and was yelling at family. A&O x 0 currently. Will do CT head, add UDS. Will monitor mental status. If still altered may need admission for AMS.   3:33 PM Still altered. Signed out to Dr. Canary Brim to f/u CT, labs. If altered will need admission.     Wandra Arthurs, MD 11/21/14 [phone]

## 2014-11-21 NOTE — ED Notes (Signed)
Pt yelling in room. When nurse enters room pt states she is okay and that is all she will say. Son at bedside.

## 2014-11-21 NOTE — ED Notes (Signed)
Pt still unable to void at this time 

## 2014-11-21 NOTE — ED Notes (Signed)
Pt given food per OK from MD Linker.

## 2014-11-21 NOTE — ED Provider Notes (Signed)
5:29 PM pt rechecked, head CT negative, urinalysis reassuring.  On recheck patient is calm and cooperative, she is awake and alert, oriented x 3,  Family member in room states she is back to her normal baseline.  Per plan communicated by Dr. Darl Householder, as patient is back to her baseline she is clear for discharge to home.  She was advised to followup with her primary care doctors.    Threasa Beards, MD 11/21/14 [phone]

## 2014-11-21 NOTE — ED Notes (Addendum)
Pt was yelling at family member, HR increased to 152.  Dr. Darl Householder notified.  MD to bedside.  Pt states her bed is vile, repetitively.  Pt also stating that she is Charlies Silvers [phone] repetitively.  Pt breathing rapidly and is unable to state where she is or what day it is.  Pt walked to the bathroom, unable to answer questions when asked.  Answering yes to everything.  Orders received.

## 2014-11-21 NOTE — ED Notes (Signed)
Patient states she woke up this morning and felt a little confused.  States her family bought her to the ed to be examined.  The son of the patient states that the patient has not been home for the last two days and has not used her cpap for sleep, and told him she is not sleeping good.  The son states he brought her here for more sleeping pills because this was the same problem she had two or three months ago and was given sleeping pills.

## 2014-11-21 NOTE — ED Notes (Signed)
Son stated pt woke up disoriented today. Denies weakness in extremeties. Oriented x 2 self and place. Denies pain.  Pt spent night with her mother and her mother states she woke up at 0600 disoriented and has had recent hx of same 3-4 months ago.

## 2014-11-21 NOTE — ED Notes (Signed)
Pt alert and oriented at present and cooperative.

## 2014-11-21 NOTE — Discharge Instructions (Signed)
Return to the ED with any concerns including headache, thoughts or feelings of suicide or homicide, changes of mental status, fever/chills, changes in vision or speech, decreased level of alertness/lethargy, or any other alarming symptoms

## 2017-11-13 ENCOUNTER — Other Ambulatory Visit (HOSPITAL_BASED_OUTPATIENT_CLINIC_OR_DEPARTMENT_OTHER): Payer: Self-pay | Admitting: Hematology and Oncology

## 2017-11-13 DIAGNOSIS — Z1231 Encounter for screening mammogram for malignant neoplasm of breast: Secondary | ICD-10-CM

## 2019-10-17 ENCOUNTER — Inpatient Hospital Stay (HOSPITAL_BASED_OUTPATIENT_CLINIC_OR_DEPARTMENT_OTHER)
Admission: EM | Admit: 2019-10-17 | Discharge: 2019-10-28 | DRG: 092 | Disposition: A | Discharge status: Home / Self Care | Payer: Medicare Other | Attending: Internal Medicine | Admitting: Internal Medicine

## 2019-10-17 ENCOUNTER — Other Ambulatory Visit: Payer: Self-pay

## 2019-10-17 ENCOUNTER — Emergency Department (HOSPITAL_COMMUNITY): Payer: Medicare Other

## 2019-10-17 ENCOUNTER — Emergency Department (HOSPITAL_BASED_OUTPATIENT_CLINIC_OR_DEPARTMENT_OTHER): Payer: Medicare Other

## 2019-10-17 ENCOUNTER — Encounter (HOSPITAL_BASED_OUTPATIENT_CLINIC_OR_DEPARTMENT_OTHER): Payer: Self-pay | Admitting: *Deleted

## 2019-10-17 DIAGNOSIS — R32 Unspecified urinary incontinence: Secondary | ICD-10-CM | POA: Diagnosis not present

## 2019-10-17 DIAGNOSIS — T426X5A Adverse effect of other antiepileptic and sedative-hypnotic drugs, initial encounter: Secondary | ICD-10-CM | POA: Diagnosis not present

## 2019-10-17 DIAGNOSIS — T375X5A Adverse effect of antiviral drugs, initial encounter: Secondary | ICD-10-CM | POA: Diagnosis present

## 2019-10-17 DIAGNOSIS — E669 Obesity, unspecified: Secondary | ICD-10-CM | POA: Diagnosis not present

## 2019-10-17 DIAGNOSIS — N179 Acute kidney failure, unspecified: Secondary | ICD-10-CM | POA: Diagnosis not present

## 2019-10-17 DIAGNOSIS — Z170 Estrogen receptor positive status [ER+]: Secondary | ICD-10-CM

## 2019-10-17 DIAGNOSIS — R569 Unspecified convulsions: Secondary | ICD-10-CM | POA: Diagnosis present

## 2019-10-17 DIAGNOSIS — Z538 Procedure and treatment not carried out for other reasons: Secondary | ICD-10-CM | POA: Diagnosis present

## 2019-10-17 DIAGNOSIS — R748 Abnormal levels of other serum enzymes: Secondary | ICD-10-CM | POA: Diagnosis present

## 2019-10-17 DIAGNOSIS — G9340 Encephalopathy, unspecified: Secondary | ICD-10-CM

## 2019-10-17 DIAGNOSIS — Z79811 Long term (current) use of aromatase inhibitors: Secondary | ICD-10-CM

## 2019-10-17 DIAGNOSIS — N289 Disorder of kidney and ureter, unspecified: Secondary | ICD-10-CM | POA: Diagnosis not present

## 2019-10-17 DIAGNOSIS — E860 Dehydration: Secondary | ICD-10-CM | POA: Diagnosis present

## 2019-10-17 DIAGNOSIS — Z853 Personal history of malignant neoplasm of breast: Secondary | ICD-10-CM | POA: Diagnosis not present

## 2019-10-17 DIAGNOSIS — I10 Essential (primary) hypertension: Secondary | ICD-10-CM | POA: Diagnosis not present

## 2019-10-17 DIAGNOSIS — R809 Proteinuria, unspecified: Secondary | ICD-10-CM | POA: Diagnosis not present

## 2019-10-17 DIAGNOSIS — R410 Disorientation, unspecified: Secondary | ICD-10-CM

## 2019-10-17 DIAGNOSIS — R404 Transient alteration of awareness: Secondary | ICD-10-CM

## 2019-10-17 DIAGNOSIS — R7989 Other specified abnormal findings of blood chemistry: Secondary | ICD-10-CM

## 2019-10-17 DIAGNOSIS — Z20828 Contact with and (suspected) exposure to other viral communicable diseases: Secondary | ICD-10-CM | POA: Diagnosis present

## 2019-10-17 DIAGNOSIS — R4182 Altered mental status, unspecified: Secondary | ICD-10-CM

## 2019-10-17 DIAGNOSIS — R824 Acetonuria: Secondary | ICD-10-CM | POA: Diagnosis not present

## 2019-10-17 DIAGNOSIS — G92 Toxic encephalopathy: Principal | ICD-10-CM | POA: Diagnosis present

## 2019-10-17 DIAGNOSIS — G4733 Obstructive sleep apnea (adult) (pediatric): Secondary | ICD-10-CM | POA: Diagnosis present

## 2019-10-17 DIAGNOSIS — E119 Type 2 diabetes mellitus without complications: Secondary | ICD-10-CM | POA: Diagnosis present

## 2019-10-17 DIAGNOSIS — Z87891 Personal history of nicotine dependence: Secondary | ICD-10-CM

## 2019-10-17 DIAGNOSIS — C50919 Malignant neoplasm of unspecified site of unspecified female breast: Secondary | ICD-10-CM | POA: Diagnosis present

## 2019-10-17 DIAGNOSIS — Z6832 Body mass index (BMI) 32.0-32.9, adult: Secondary | ICD-10-CM

## 2019-10-17 DIAGNOSIS — E876 Hypokalemia: Secondary | ICD-10-CM | POA: Diagnosis not present

## 2019-10-17 LAB — URINALYSIS, ROUTINE W REFLEX MICROSCOPIC
Bacteria, UA: NONE SEEN
Bilirubin Urine: NEGATIVE
Glucose, UA: NEGATIVE mg/dL
Hgb urine dipstick: NEGATIVE
Ketones, ur: 80 mg/dL — AB
Leukocytes,Ua: NEGATIVE
Nitrite: NEGATIVE
Protein, ur: 100 mg/dL — AB
Specific Gravity, Urine: 1.028 (ref 1.005–1.030)
pH: 5 (ref 5.0–8.0)

## 2019-10-17 LAB — COMPREHENSIVE METABOLIC PANEL
ALT: 31 U/L (ref 0–44)
AST: 40 U/L (ref 15–41)
Albumin: 4.5 g/dL (ref 3.5–5.0)
Alkaline Phosphatase: 56 U/L (ref 38–126)
Anion gap: 11 (ref 5–15)
BUN: 25 mg/dL — ABNORMAL HIGH (ref 8–23)
CO2: 21 mmol/L — ABNORMAL LOW (ref 22–32)
Calcium: 9.8 mg/dL (ref 8.9–10.3)
Chloride: 107 mmol/L (ref 98–111)
Creatinine, Ser: 1.14 mg/dL — ABNORMAL HIGH (ref 0.44–1.00)
GFR calc Af Amer: 59 mL/min — ABNORMAL LOW (ref >60)
GFR calc non Af Amer: 51 mL/min — ABNORMAL LOW (ref >60)
Glucose, Bld: 136 mg/dL — ABNORMAL HIGH (ref 70–99)
Potassium: 3.7 mmol/L (ref 3.5–5.1)
Sodium: 139 mmol/L (ref 135–145)
Total Bilirubin: 1.2 mg/dL (ref 0.3–1.2)
Total Protein: 8.7 g/dL — ABNORMAL HIGH (ref 6.5–8.1)

## 2019-10-17 LAB — CBC WITH DIFFERENTIAL/PLATELET
Abs Immature Granulocytes: 0.06 10*3/uL (ref 0.00–0.07)
Basophils Absolute: 0.1 10*3/uL (ref 0.0–0.1)
Basophils Relative: 0 %
Eosinophils Absolute: 0 10*3/uL (ref 0.0–0.5)
Eosinophils Relative: 0 %
HCT: 38.6 % (ref 36.0–46.0)
Hemoglobin: 12.7 g/dL (ref 12.0–15.0)
Immature Granulocytes: 0 %
Lymphocytes Relative: 23 %
Lymphs Abs: 4 10*3/uL (ref 0.7–4.0)
MCH: 28.2 pg (ref 26.0–34.0)
MCHC: 32.9 g/dL (ref 30.0–36.0)
MCV: 85.8 fL (ref 80.0–100.0)
Monocytes Absolute: 1 10*3/uL (ref 0.1–1.0)
Monocytes Relative: 6 %
Neutro Abs: 12.5 10*3/uL — ABNORMAL HIGH (ref 1.7–7.7)
Neutrophils Relative %: 71 %
Platelets: 299 10*3/uL (ref 150–400)
RBC: 4.5 MIL/uL (ref 3.87–5.11)
RDW: 14.5 % (ref 11.5–15.5)
WBC: 17.6 10*3/uL — ABNORMAL HIGH (ref 4.0–10.5)
nRBC: 0 % (ref 0.0–0.2)

## 2019-10-17 LAB — CK: Total CK: 808 U/L — ABNORMAL HIGH (ref 38–234)

## 2019-10-17 IMAGING — CT CT HEAD W/O CM
3 of 4 series · 15 of 47 positions shown, 18 images · non-contrast
Comparison: [DATE]

CLINICAL DATA: Mental status changes.

EXAM:
CT HEAD WITHOUT CONTRAST
TECHNIQUE: Contiguous axial images were obtained from the base of the skull
through the vertex without intravenous contrast.

[Series 2: head wo · axial · 0.42mm/px · z∈[-334,-218]mm · 9 of 29 slices shown, 12 images]
[im 3/29  brain]
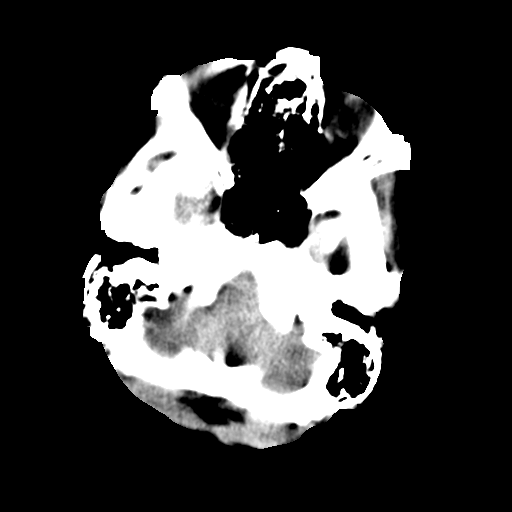
[im 3/29  bone]
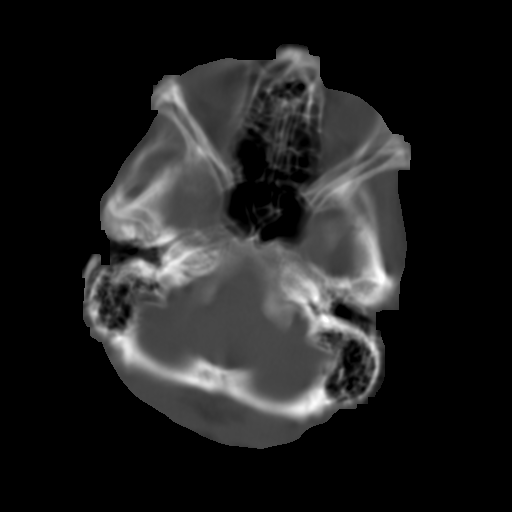
[im 6/29  brain]
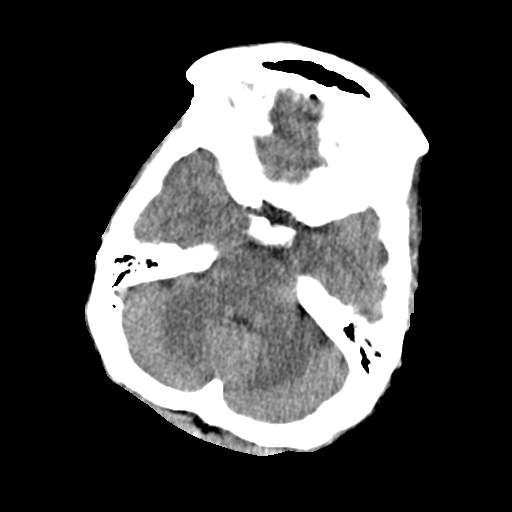
[im 9/29  brain]
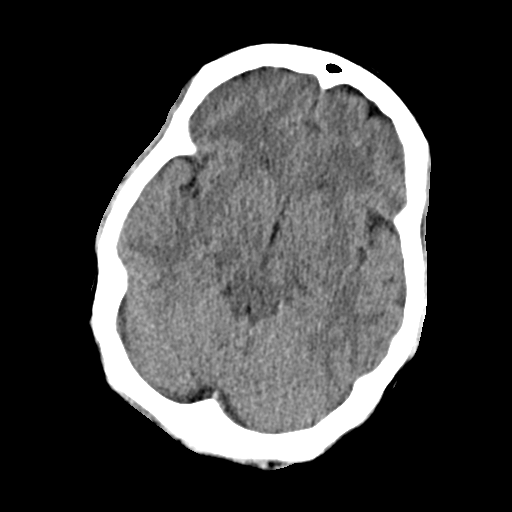
[im 12/29  brain]
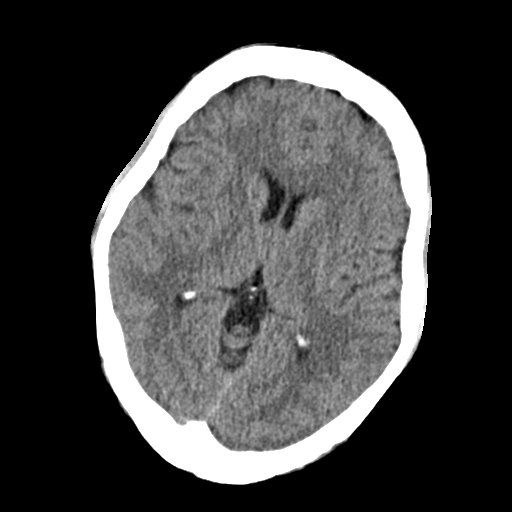
[im 15/29  brain]
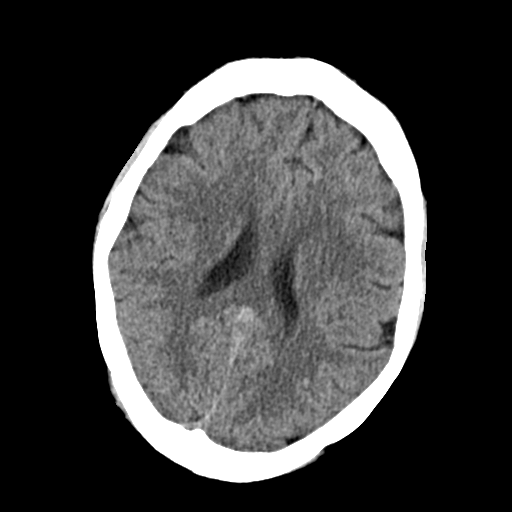
[im 15/29  bone]
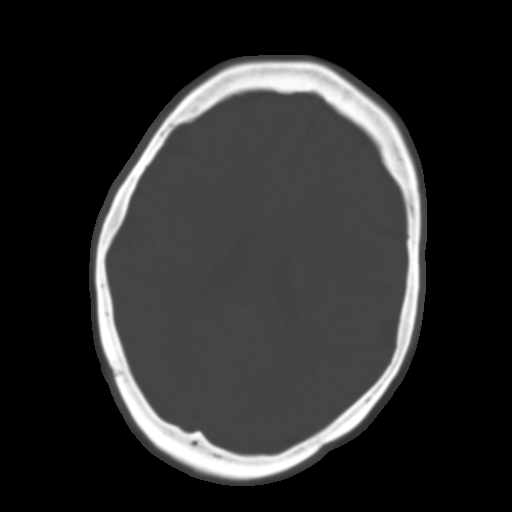
[im 17/29  brain]
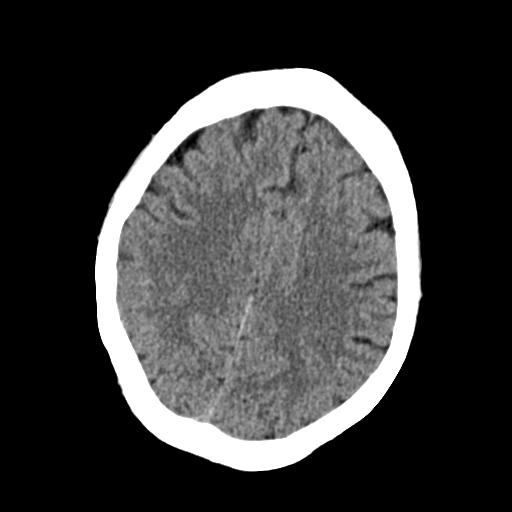
[im 20/29  brain]
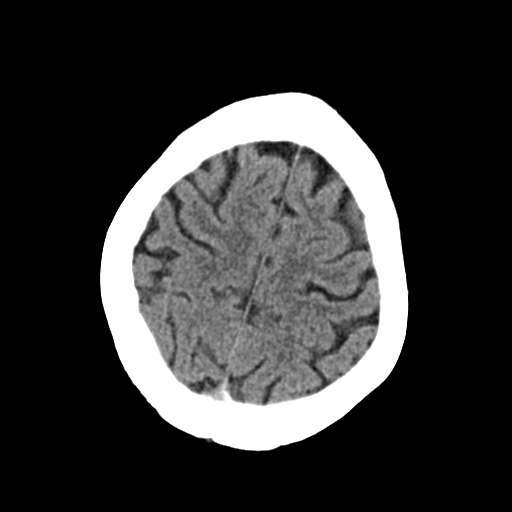
[im 23/29  brain]
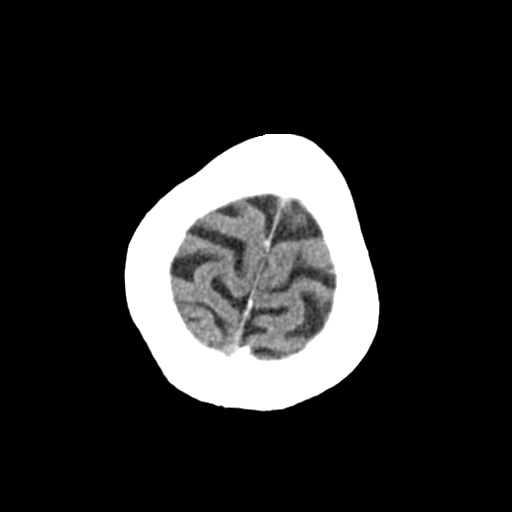
[im 26/29  brain]
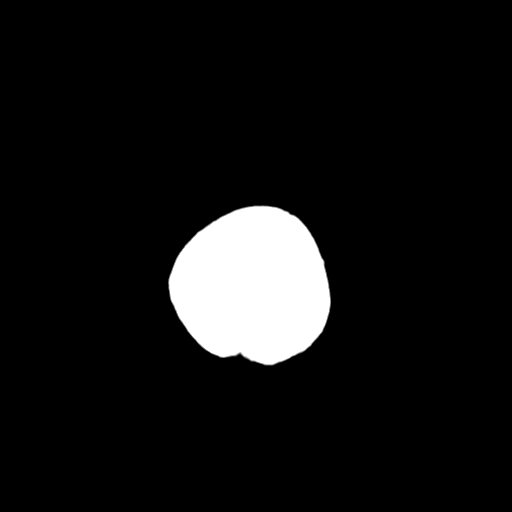
[im 26/29  bone]
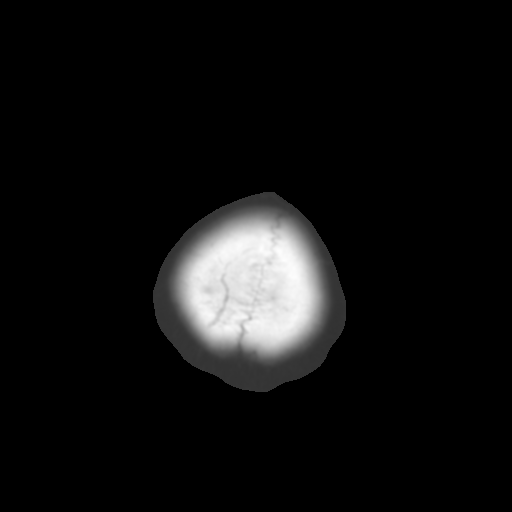

[Series 4: coronal soft · coronal · 0.29mm/px · 3 of 67 slices shown]
[im 23/67  brain]
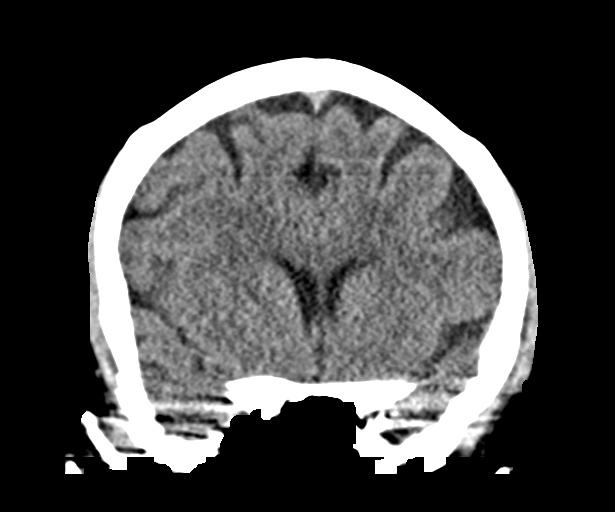
[im 30/67  brain]
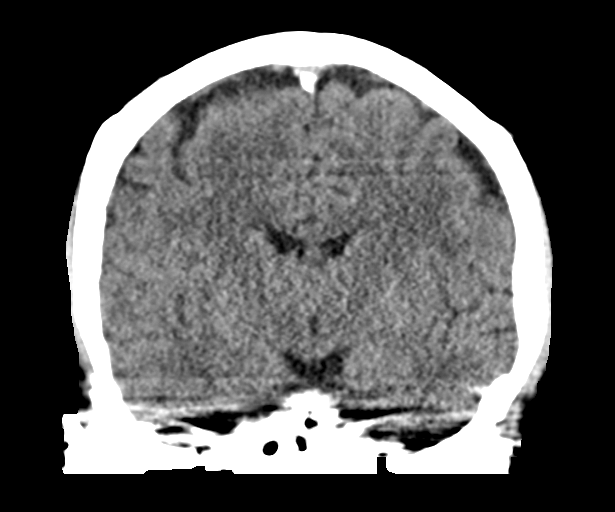
[im 37/67  brain]
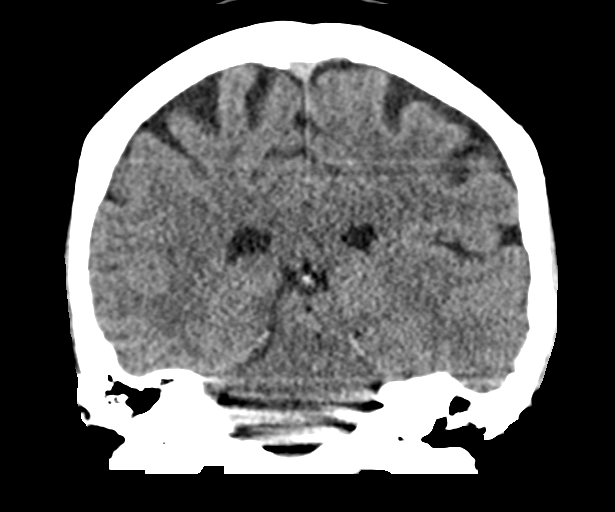

[Series 5: sag soft · sagittal · 0.28mm/px · 3 of 62 slices shown]
[im 21/62  brain]
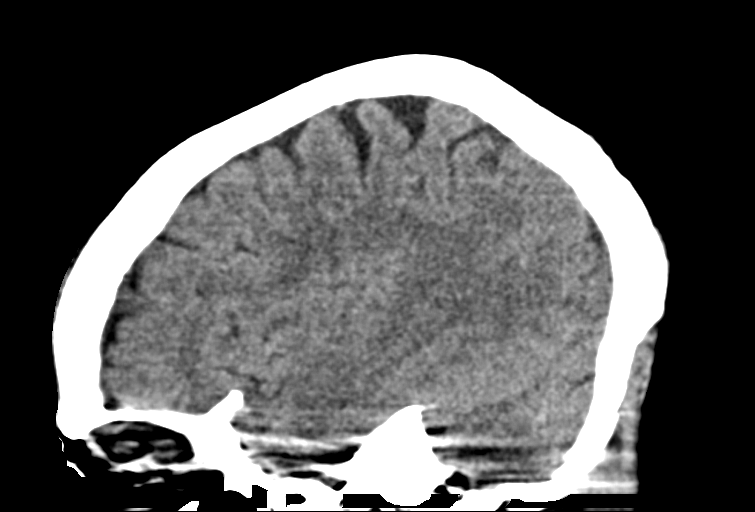
[im 31/62  brain]
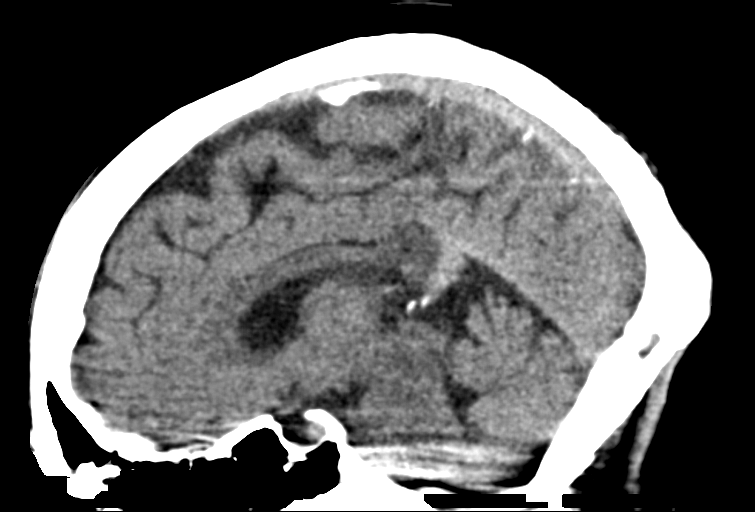
[im 41/62  brain]
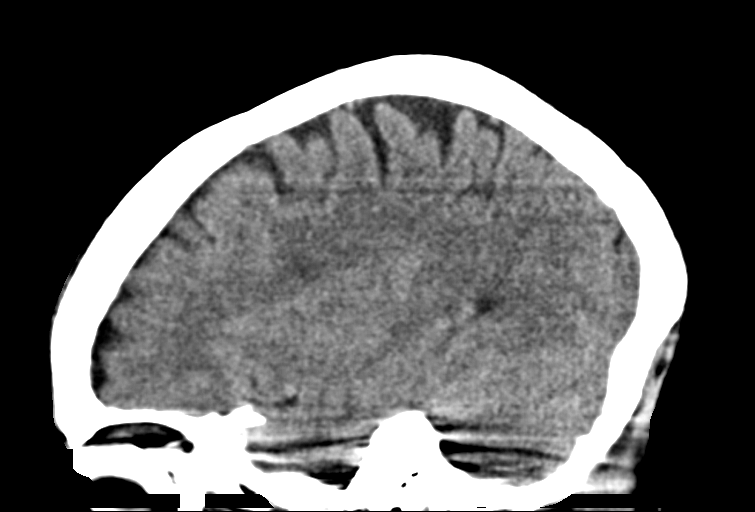

[15 of 47 positions shown; findings below may reference images not displayed]

FINDINGS: Brain: The ventricles are normal in size and configuration. No
extra-axial fluid collections are identified. The gray-white
differentiation is maintained. No CT findings for acute hemispheric
infarction or intracranial hemorrhage. No mass lesions. The
brainstem and cerebellum are normal.

Vascular: No hyperdense vessels or obvious aneurysm.

Skull: No acute skull fracture.  No bone lesion.

Sinuses/Orbits: The paranasal sinuses and mastoid air cells are
clear. The globes are intact.

Other: No scalp lesions, laceration or hematoma.
IMPRESSION: No acute intracranial findings.

## 2019-10-17 IMAGING — DX DG CHEST 2V
2 series · 2 of 2 positions shown · non-contrast
Comparison: None

CLINICAL DATA: Leukocytosis, disorientation, altered mental status,
history hypertension, diabetes mellitus, breast cancer

EXAM:
CHEST - 2 VIEW

[chest lat]
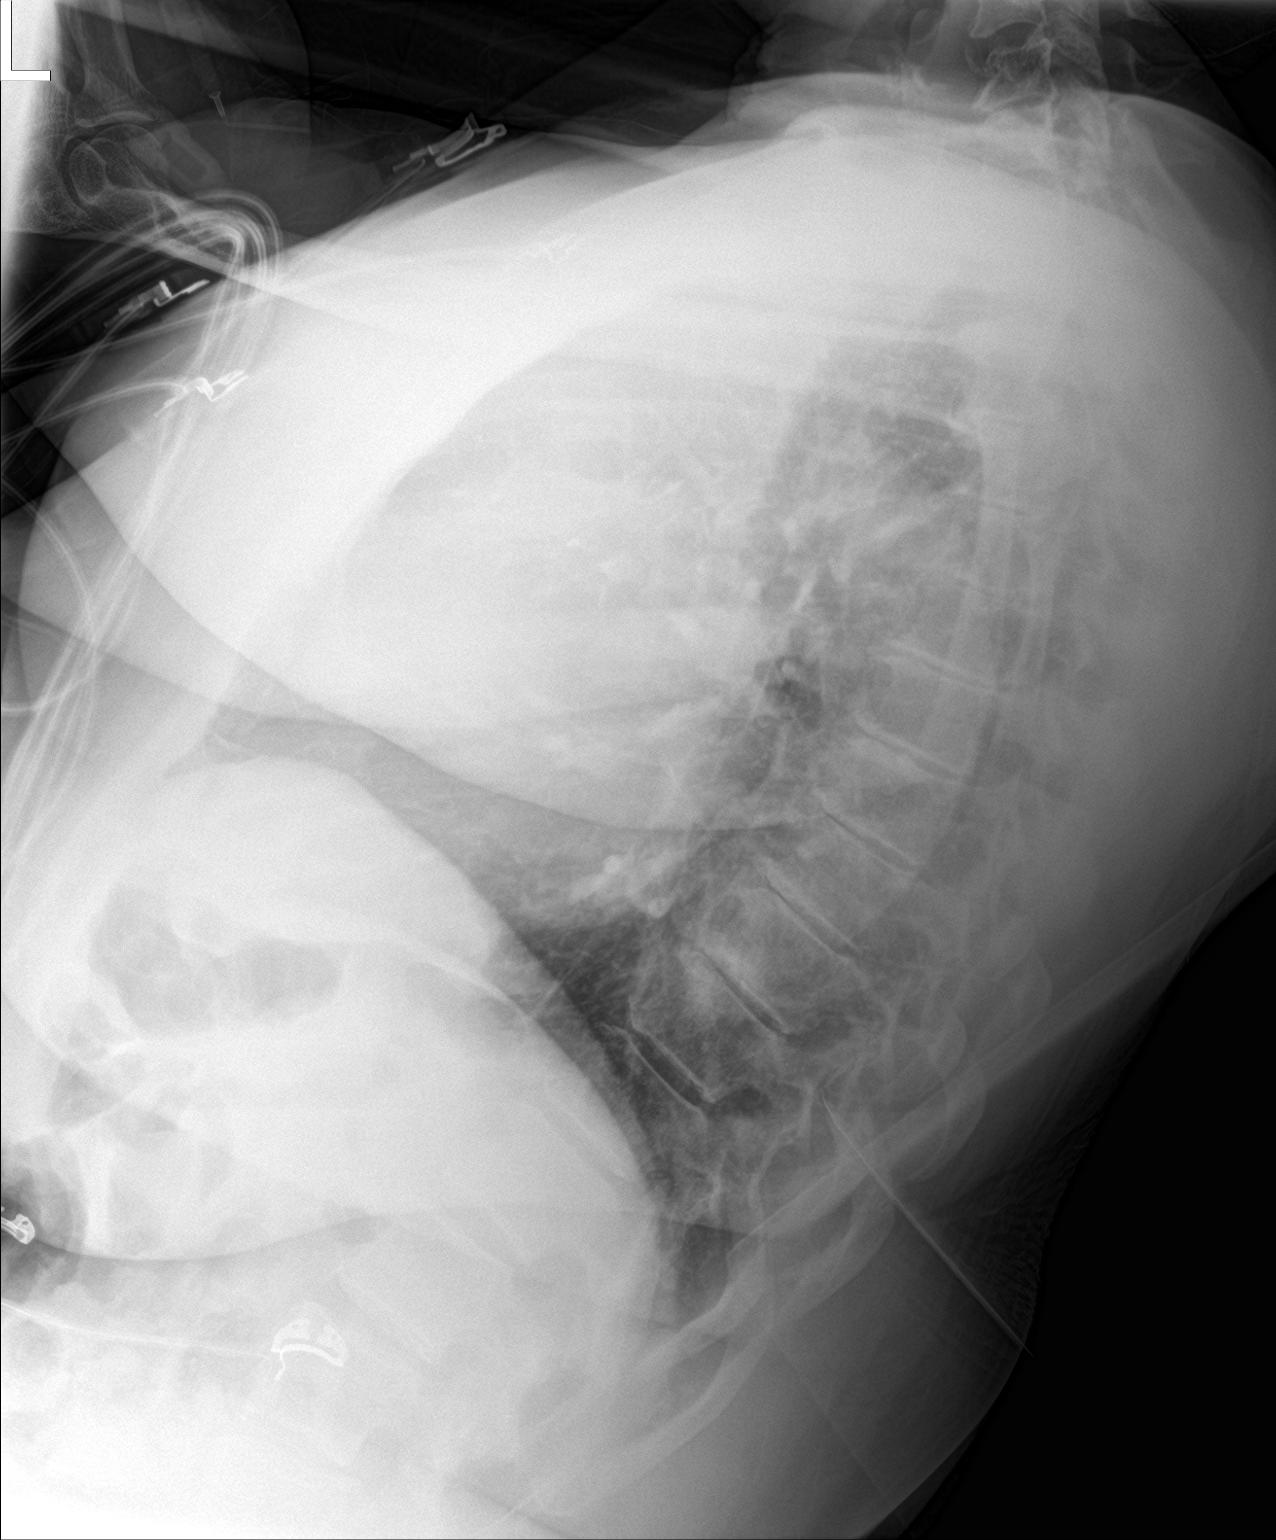

[chest ap strecther]
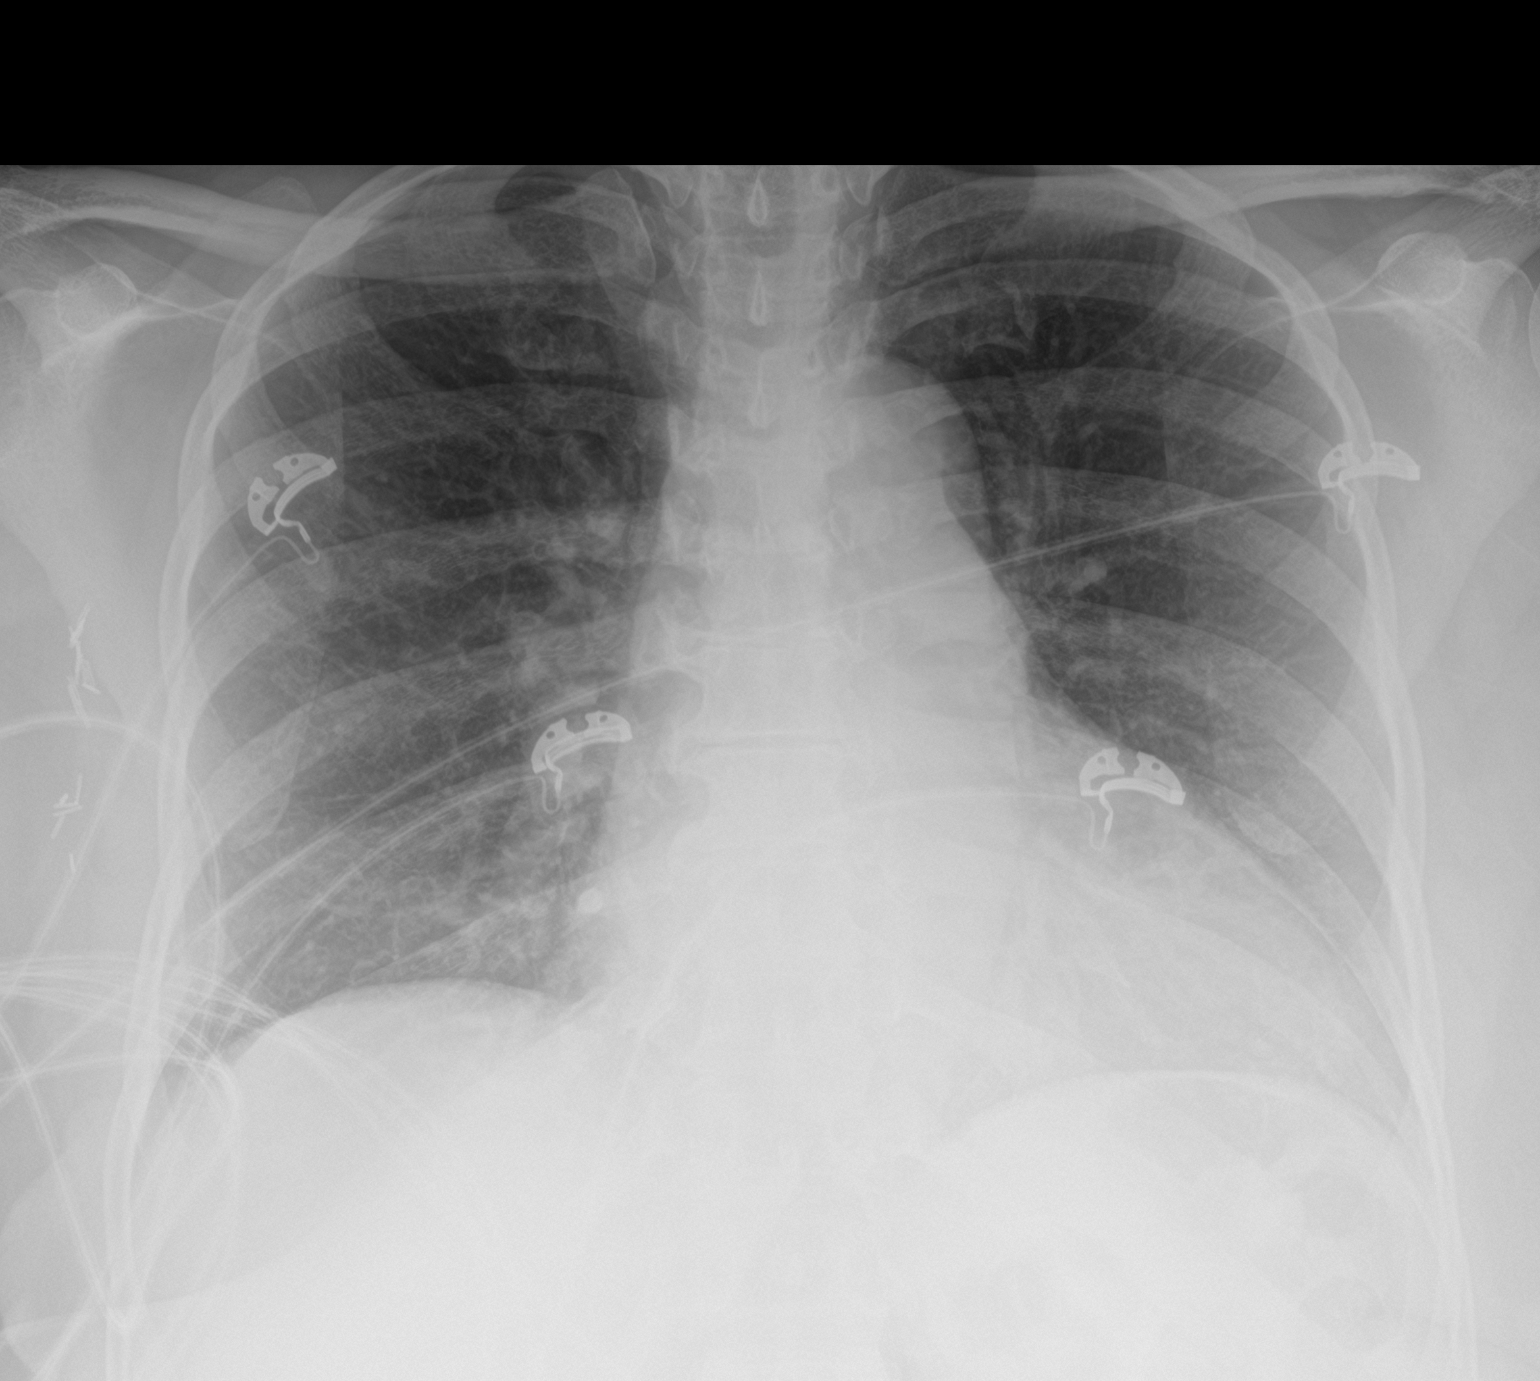

[2 of 2 positions shown; findings below may reference images not displayed]

FINDINGS: Enlargement of cardiac silhouette.

Mediastinal contours and pulmonary vascularity normal.

Central peribronchial thickening.

No pulmonary infiltrate, pleural effusion, or pneumothorax.

Scattered degenerative disc disease changes thoracic spine.
IMPRESSION: Enlargement of cardiac silhouette.

Mild bronchitic changes without infiltrate.

## 2019-10-17 IMAGING — MR MR HEAD W/O CM
10 of 12 series · 33 of 48 positions shown · non-contrast
Comparison: Head CT earlier tonight.

CLINICAL DATA: 63-year-old female with altered mental status.

EXAM:
MRI HEAD WITHOUT CONTRAST
TECHNIQUE: Multiplanar, multiecho pulse sequences of the brain and surrounding
structures were obtained without intravenous contrast.

[Series 2: DWI · axial · 3.0mm · 0.94mm/px · z∈[+7,+154]mm · 7 of 100 slices shown (1 of 2)]
[im 1/100]
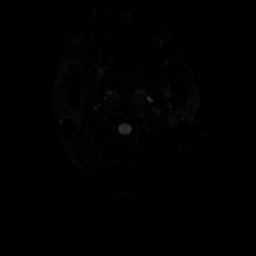
[im 17/100]
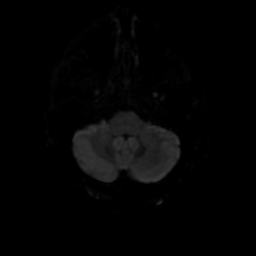
[im 34/100]
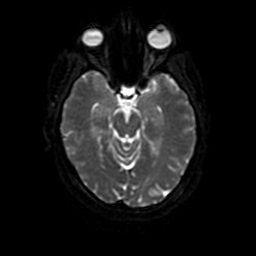
[im 50/100]
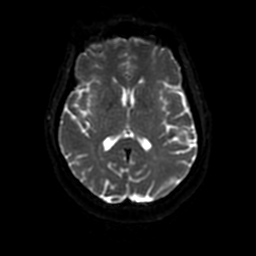
[im 67/100]
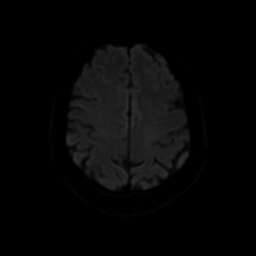
[im 83/100]
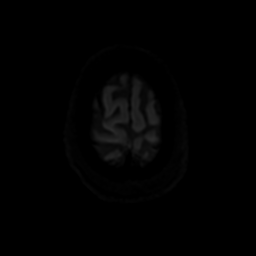
[im 100/100]
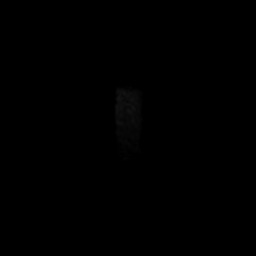

[Series 3: DWI · coronal · 4.0mm · 0.94mm/px · 6 of 76 slices shown (2 of 2)]
[im 1/76]
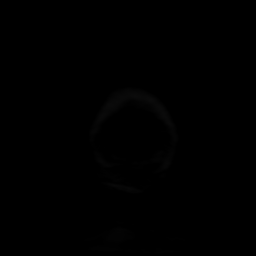
[im 16/76]
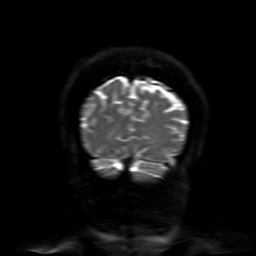
[im 31/76]
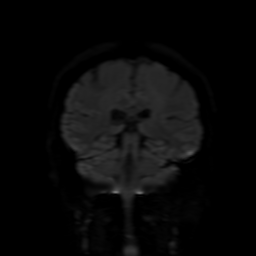
[im 46/76]
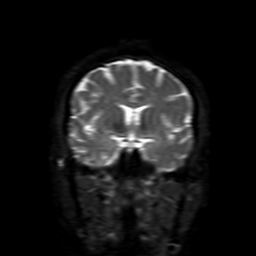
[im 61/76]
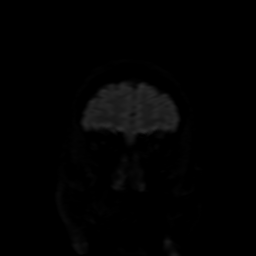
[im 76/76]
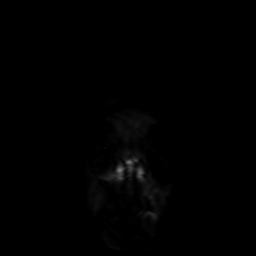

[Series 4: FLAIR · sagittal · 5.0mm · 0.47mm/px · 2 of 24 slices shown (1 of 3)]
[im 1/24]
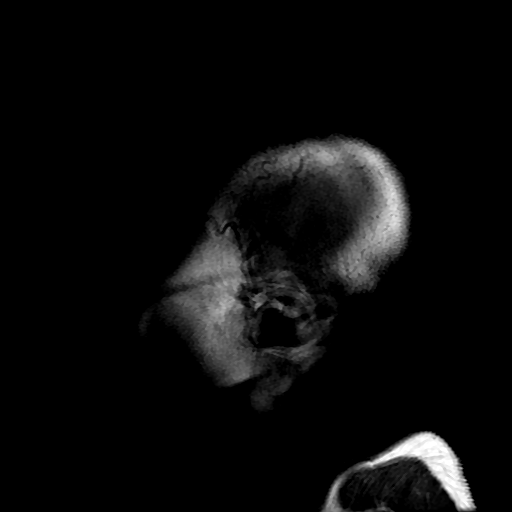
[im 24/24]
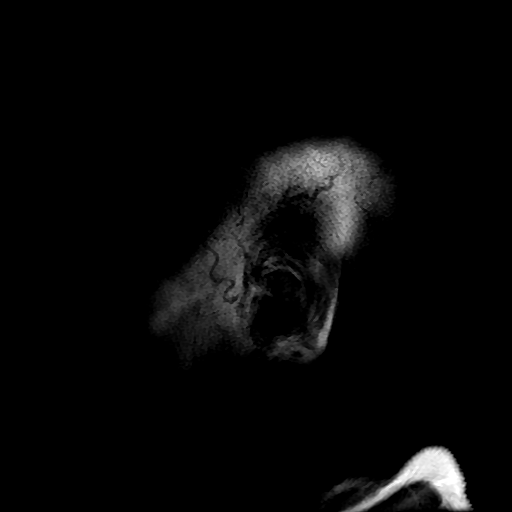

[Series 5: T2 · axial · 5.0mm · 0.47mm/px · z∈[-1,+165]mm · 2 of 29 slices shown (1 of 2)]
[im 1/29]
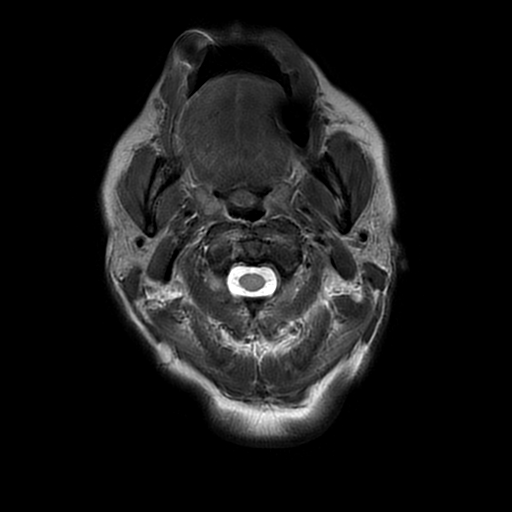
[im 29/29]
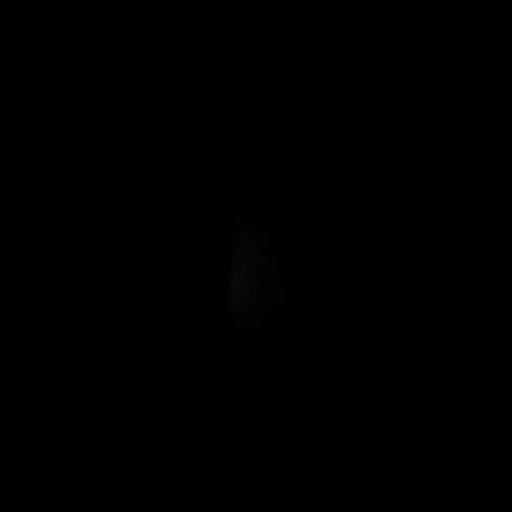

[Series 6: FLAIR · axial · 3.0mm · 0.45mm/px · z∈[+16,+159]mm · 2 of 25 slices shown (2 of 3)]
[im 1/25]
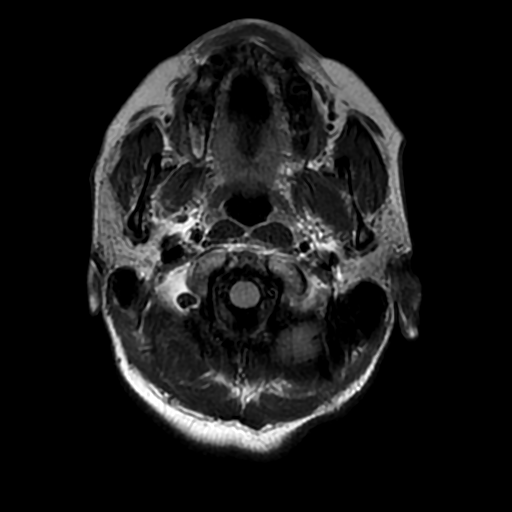
[im 25/25]
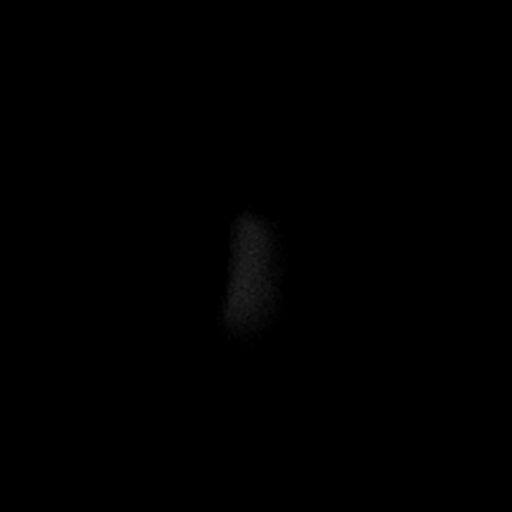

[Series 7: SWI · axial · 3.0mm · 0.47mm/px · z∈[+9,+59]mm · 3 of 100 slices shown]
[im 1/100]
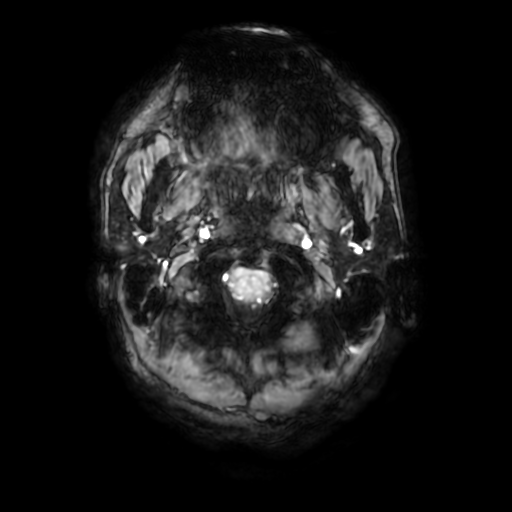
[im 17/100]
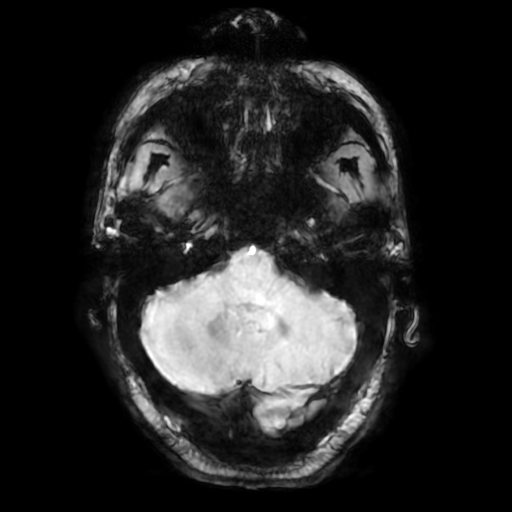
[im 34/100]
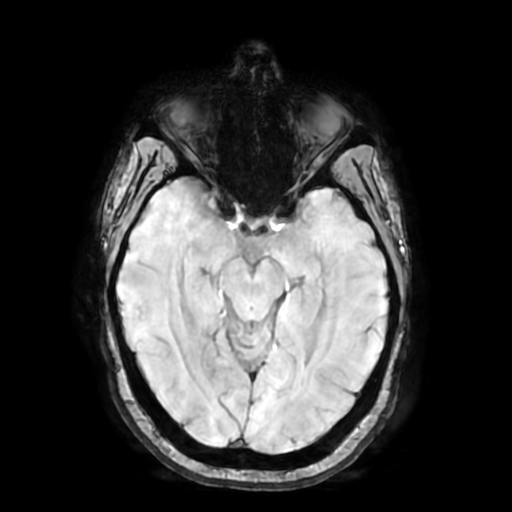

[Series 9: T2 · coronal · 5.0mm · 0.39mm/px · 2 of 31 slices shown (2 of 2)]
[im 1/31]
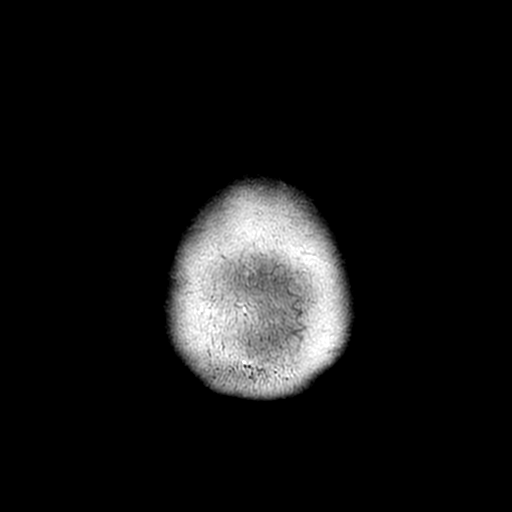
[im 31/31]
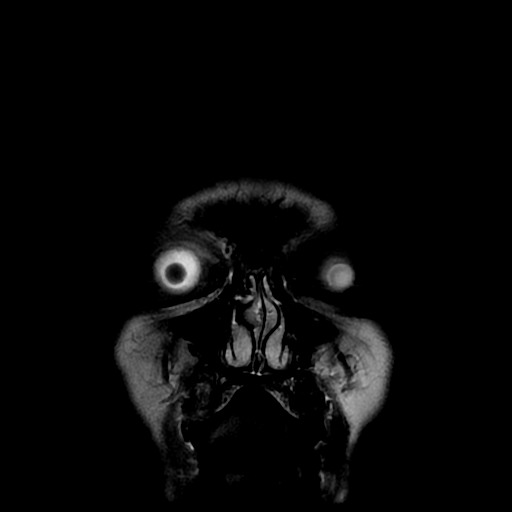

[Series 10: FLAIR · sagittal · 5.0mm · 0.47mm/px · 2 of 24 slices shown (3 of 3)]
[im 1/24]
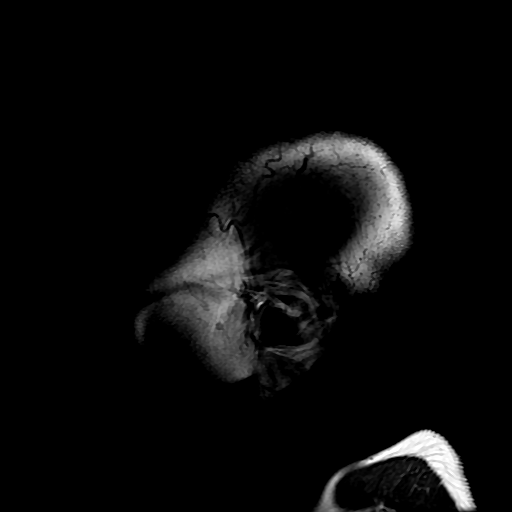
[im 24/24]
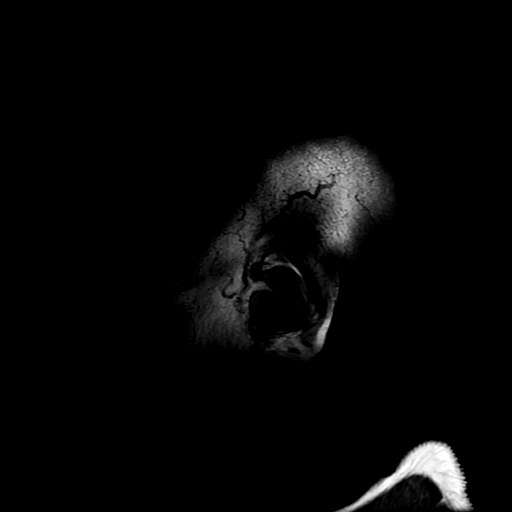

[Series 250: ADC · axial · 3.0mm · 0.94mm/px · z∈[+7,+154]mm · 4 of 50 slices shown (1 of 2)]
[im 1/50]
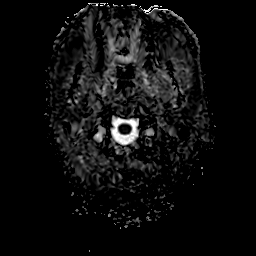
[im 17/50]
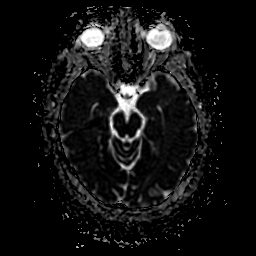
[im 33/50]
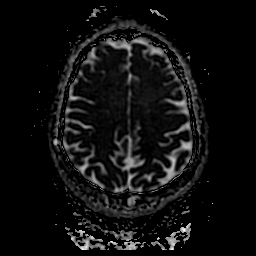
[im 50/50]
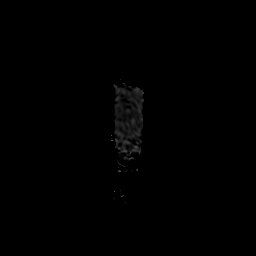

[Series 350: ADC · coronal · 4.0mm · 0.94mm/px · 3 of 38 slices shown (2 of 2)]
[im 1/38]
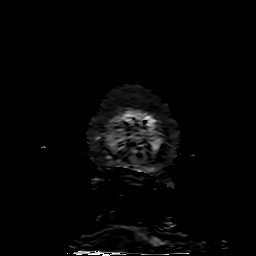
[im 19/38]
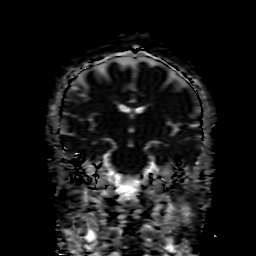
[im 38/38]
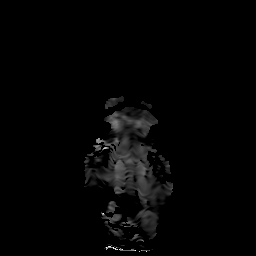

[33 of 48 positions shown; findings below may reference images not displayed]

Report of [REDACTED] [HOSPITAL] [HOSPITAL] brain MRI [DATE] (no images
available).
FINDINGS: Brain: Cerebral volume is within normal limits for age. No
restricted diffusion to suggest acute infarction. No midline shift,
mass effect, evidence of mass lesion, ventriculomegaly, extra-axial
collection or acute intracranial hemorrhage. Cervicomedullary
junction and pituitary are within normal limits.

Scattered mild - or at most moderate for age - cerebral white matter
T2 and FLAIR hyperintensity in a nonspecific configuration, most
pronounced in the left corona radiata on series 6, image 15. No
superimposed cortical encephalomalacia or chronic cerebral blood
products. Negative deep gray nuclei, brainstem and cerebellum.

Vascular: Major intracranial vascular flow voids are preserved.

Skull and upper cervical spine: Upper cervical spine obscured by
motion on sagittal T1 imaging. Visualized bone marrow signal is
within normal limits.

Sinuses/Orbits: Negative orbits. Paranasal sinuses are clear.

Other: Visible internal auditory structures appear normal. Mastoids
are clear. Scalp and face soft tissues appear negative.
IMPRESSION: 1. No acute intracranial abnormality.
2. Mild for age nonspecific cerebral white matter signal changes,
most commonly due to chronic small vessel disease.

## 2019-10-17 MED ORDER — DEXTROSE 5 % IV SOLN
750.0000 mg | Freq: Once | INTRAVENOUS | Status: AC
  Administered 2019-10-18: 750 mg via INTRAVENOUS
  Filled 2019-10-17: qty 15

## 2019-10-17 MED ORDER — DEXTROSE 5 % IV SOLN
750.0000 mg | Freq: Three times a day (TID) | INTRAVENOUS | Status: DC
  Filled 2019-10-17 (×2): qty 15

## 2019-10-17 MED ORDER — MIDAZOLAM HCL 2 MG/2ML IJ SOLN
2.0000 mg | Freq: Once | INTRAMUSCULAR | Status: AC
  Administered 2019-10-17: 2 mg via INTRAVENOUS
  Filled 2019-10-17: qty 2

## 2019-10-17 NOTE — ED Notes (Signed)
[facility] transport to [facility] ED.

## 2019-10-17 NOTE — ED Notes (Signed)
Report called to [facility], eta 1900

## 2019-10-17 NOTE — ED Notes (Signed)
Pt for transfer to [facility] ED for MRI, accepted by Dr Maryan Rued. To be transported by ambulance.  IV in place.

## 2019-10-17 NOTE — ED Notes (Signed)
Patient transported to CT 

## 2019-10-17 NOTE — ED Provider Notes (Signed)
[facility] EMERGENCY DEPARTMENT Provider Note   CSN: [id number] Arrival date & time: 10/17/19  1646     History   Chief Complaint Chief Complaint  Patient presents with  . Altered Mental Status    HPI Monica Bauer is a 63 y.o. female.  Patient son reports that his mother over the past 3 days has seemed to be more disoriented.  Seems to be more confused about what is going on around her.  Denies noting any facial droop, speech change, unilateral weakness.  No recent falls.  Went to the primary doctor this afternoon, they did urine sample and recommended patient go to ER for further evaluation.  Son reports this is similar to episodes that she had many years ago.  Level 5 history caveat limited due to altered mental status.  Reviewed chart, reviewed recent PCP note through care everywhere, reviewed notes from ER visit in 2015, patient has prior diagnosis of transient global amnesia.        HPI  Past Medical History:  Diagnosis Date  . Breast cancer ([place])   . Diabetes mellitus without complication ([place])   . Hypertension     There are no active problems to display for this patient.   Past Surgical History:  Procedure Laterality Date  . BREAST LUMPECTOMY       OB History   No obstetric history on file.      Home Medications    Prior to Admission medications   Medication Sig Start Date End Date Taking? Authorizing Provider  eszopiclone (LUNESTA) 1 MG TABS tablet Take 1 tablet (1 mg total) by mouth at bedtime as needed for sleep. Take immediately before bedtime 09/10/14   Tanna Furry, MD  GLIPIZIDE PO Take by mouth.    [provider]  HYDRALAZINE-HCTZ PO Take by mouth.    [provider]  LOSARTAN POTASSIUM PO Take by mouth.    [provider]  UNKNOWN TO PATIENT Cancer med    [provider]    Family History History reviewed. No pertinent family history.  Social History Social History   Tobacco Use  .  Smoking status: Former Smoker  Substance Use Topics  . Alcohol use: No  . Drug use: Not on file     Allergies   Other   Review of Systems Review of Systems  Unable to perform ROS: Mental status change     Physical Exam Updated Vital Signs BP 124/66   Pulse 89   Temp 98.6 F (37 C) (Oral)   Resp 18   Ht 5\' 2"  (1.575 m)   Wt 90.3 kg   SpO2 99%   BMI 36.40 kg/m   Physical Exam Vitals signs and nursing note reviewed.  Constitutional:      General: She is not in acute distress.    Appearance: She is well-developed.  HENT:     Head: Normocephalic and atraumatic.  Eyes:     Conjunctiva/sclera: Conjunctivae normal.  Neck:     Musculoskeletal: Neck supple.  Cardiovascular:     Rate and Rhythm: Normal rate and regular rhythm.     Heart sounds: No murmur.  Pulmonary:     Effort: Pulmonary effort is normal. No respiratory distress.     Breath sounds: Normal breath sounds.  Abdominal:     Palpations: Abdomen is soft.     Tenderness: There is no abdominal tenderness.  Skin:    General: Skin is warm and dry.  Neurological:  Mental Status: She is alert.     Comments: Alert, speaks clear but random nonsensical phrases, follows commands, moves all four extremities, opens eyes sponataneously; CN 2-12 intact, normal FNF, 5/5 strength in b/l UE LE, sensation to light touch intact in b/l UE, LE     ED Treatments / Results  Labs (all labs ordered are listed, but only abnormal results are displayed) Labs Reviewed  CBC WITH DIFFERENTIAL/PLATELET - Abnormal; Notable for the following components:      Result Value   WBC 17.6 (*)    Neutro Abs 12.5 (*)    All other components within normal limits  COMPREHENSIVE METABOLIC PANEL    EKG None  Radiology Ct Head Wo Contrast  Result Date: 10/17/2019 CLINICAL DATA:  Mental status changes. EXAM: CT HEAD WITHOUT CONTRAST TECHNIQUE: Contiguous axial images were obtained from the base of the skull through the vertex without  intravenous contrast. COMPARISON:  11/21/2014 FINDINGS: Brain: The ventricles are normal in size and configuration. No extra-axial fluid collections are identified. The gray-white differentiation is maintained. No CT findings for acute hemispheric infarction or intracranial hemorrhage. No mass lesions. The brainstem and cerebellum are normal. Vascular: No hyperdense vessels or obvious aneurysm. Skull: No acute skull fracture.  No bone lesion. Sinuses/Orbits: The paranasal sinuses and mastoid air cells are clear. The globes are intact. Other: No scalp lesions, laceration or hematoma. IMPRESSION: No acute intracranial findings. Electronically Signed   By: Marijo Sanes M.D.   On: 10/17/2019 17:13    Procedures Procedures (including critical care time)  Medications Ordered in ED Medications - No data to display   Initial Impression / Assessment and Plan / ED Course  I have reviewed the triage vital signs and the nursing notes.  Pertinent labs & imaging results that were available during my care of the patient were reviewed by me and considered in my medical decision making (see chart for details).     63 year old lady presents to ER with concern for altered mental status.  History obtained from son, patient having episodes of confusion.  On my initial assessment, patient seem to be markedly confused, rambling nonsensical phrases.  No focal neuro deficits on exam.  She is otherwise well-appearing with normal vital signs.  CT head negative.  Discussed case with neurology, Dr. Cheral Marker at [facility].  He recommends MRI for further evaluation.  When I reevaluated patient, she had complete resolution of her symptoms but did not remember our initial interaction.  States that she is going to get MRI but does not want to be admitted to the hospital if possible.  Discussed case with Dr.Plunkett at [facility], ER, will transfer ER to ER with plan for MRI and further observation.    Final Clinical Impressions(s)  / ED Diagnoses   Final diagnoses:  Transient alteration of awareness  Confusion    ED Discharge Orders    None       Lucrezia Starch, MD 10/17/19 2335

## 2019-10-17 NOTE — Progress Notes (Signed)
Pharmacy Antibiotic Note  Monica Bauer is a 63 y.o. female admitted on 10/17/2019 with AMS, poss viral encephalitis.  Pharmacy has been consulted for acyclovir dosing.  Plan: Acyclovir 750 mg IV q8h  Height: 5\' 2"  (157.5 cm) Weight: 199 lb (90.3 kg) IBW/kg (Calculated) : 50.1  Temp (24hrs), Avg:98.3 F (36.8 C), Min:98 F (36.7 C), Max:98.6 F (37 C)  Recent Labs  Lab 10/17/19 1657  WBC 17.6*  CREATININE 1.14*    Estimated Creatinine Clearance: 52.8 mL/min (A) (by C-G formula based on SCr of 1.14 mg/dL (H)).    Allergies  Allergen Reactions  . Other     Pt does not know med allergy    Caryl Pina 10/17/2019 11:47 PM

## 2019-10-17 NOTE — ED Notes (Signed)
Patient transported to MRI 

## 2019-10-17 NOTE — Consult Note (Addendum)
Neurology Consultation  Reason for Consult: Altered mental status Referring Physician: Dr. Octaviano Glow  CC: Altered mental status  History is obtained from: Patient, chart, patient's son at bedside  HPI: Monica Bauer is a 63 y.o. female past medical history of diabetes, hypertension, breast cancer, and episode of altered mental status that was labeled as transient global amnesia without details of the episode, carpal tunnel syndrome, presented to the emergency room for concerns of altered mental status that started somewhere around Monday, 10/13/2019.  According to the son, he spoke with his mother over the last 3 days and she sounded a little bit confused.  He did not make much of it but was called today by his uncle who said that the patient had not changed her clothes in a couple of days and appeared confused and making no sense.  According to the son, her mentation has been off and on, where she has had times where she is completely lucid and aware of the situations and other times appears confused not knowing what is going on with her as well as has garbled speech.  She was seen for this reason at [facility], case was discussed with the daytime neurology team over the phone and an MRI of the brain was recommended.  MRI brain was completed and showed no acute stroke. I was called for further recommendations.  I obtained history from the son and the chart. Patient is aware that something is not right with her but does not remember the events of altered mentation. At baseline, she is completely dependent, takes care of her 42 year old mother, who lives with her. The son could not tell me if the patient has any memory issues but says that there is a strong family history of dementia. Patient denies any recent sickness or illness.  Denies any fevers chills.  Denies any chest pain shortness of breath cough nausea vomiting abdominal pain.  Denies neck pain visual symptoms or  headaches.  No bowel bladder incontinence.  No history of documented or witnessed seizures.  ROS: Performed and documented in the HPI, negative except for the pertinent positives in the HPI.  Past Medical History:  Diagnosis Date  . Breast cancer ([place])   . Diabetes mellitus without complication ([place])   . Hypertension    History reviewed. No pertinent family history. Significant family history of dementia-mother  Social History:   reports that she has quit smoking. She does not have any smokeless tobacco history on file. She reports that she does not drink alcohol. No history on file for drug.  Medications  Current Facility-Administered Medications:  .  midazolam (VERSED) injection 2 mg, 2 mg, Intravenous, Once, Trifan, Carola Rhine, MD  Current Outpatient Medications:  .  eszopiclone (LUNESTA) 1 MG TABS tablet, Take 1 tablet (1 mg total) by mouth at bedtime as needed for sleep. Take immediately before bedtime, Disp: 14 tablet, Rfl: 0 .  GLIPIZIDE PO, Take by mouth., Disp: , Rfl:  .  HYDRALAZINE-HCTZ PO, Take by mouth., Disp: , Rfl:  .  LOSARTAN POTASSIUM PO, Take by mouth., Disp: , Rfl:  .  UNKNOWN TO PATIENT, Cancer med, Disp: , Rfl:   Exam: Current vital signs: BP (!) 146/79   Pulse 75   Temp 98 F (36.7 C) (Oral)   Resp 19   Ht 5\' 2"  (1.575 m)   Wt 90.3 kg   SpO2 95%   BMI 36.40 kg/m  Vital signs in last 24 hours: Temp:  [  98 F (36.7 C)-98.6 F (37 C)] 98 F (36.7 C) (11/19 2025) Pulse Rate:  [72-89] 75 (11/19 2300) Resp:  [18-22] 19 (11/19 2025) BP: (119-173)/(66-153) 146/79 (11/19 2300) SpO2:  [95 %-100 %] 95 % (11/19 2300) Weight:  [90.3 kg] 90.3 kg (11/19 1655)  GENERAL: Awake, alert in NAD HEENT: - Normocephalic and atraumatic, dry mm, no LN++, no Thyromegally.  Supple neck LUNGS - Clear to auscultation bilaterally with no wheezes CV - S1S2 RRR, no m/r/g, equal pulses bilaterally. ABDOMEN - Soft, nontender, nondistended with normoactive BS Ext: warm, well  perfused, intact peripheral pulses, no edema  NEURO:  Mental Status: AA&Ox3, although slightly diminished attention concentration Language: speech is nondysarthric.  Naming, repetition, fluency, and comprehension intact. Cranial Nerves: PERRL EOMI, visual fields full, no facial asymmetry, facial sensation intact, hearing intact, tongue/uvula/soft palate midline, normal sternocleidomastoid and trapezius muscle strength. No evidence of tongue atrophy or fibrillations Motor: 5/5 antigravity in all fours Tone: is normal and bulk is normal Sensation- Intact to light touch bilaterally Coordination: FTN intact bilaterally Gait- deferred  NIHSS-0   Labs I have reviewed labs in epic and the results pertinent to this consultation are:  CBC    Component Value Date/Time   WBC 17.6 (H) 10/17/2019 1657   RBC 4.50 10/17/2019 1657   HGB 12.7 10/17/2019 1657   HCT 38.6 10/17/2019 1657   PLT 299 10/17/2019 1657   MCV 85.8 10/17/2019 1657   MCH 28.2 10/17/2019 1657   MCHC 32.9 10/17/2019 1657   RDW 14.5 10/17/2019 1657   LYMPHSABS 4.0 10/17/2019 1657   MONOABS 1.0 10/17/2019 1657   EOSABS 0.0 10/17/2019 1657   BASOSABS 0.1 10/17/2019 1657    CMP     Component Value Date/Time   NA 139 10/17/2019 1657   K 3.7 10/17/2019 1657   CL 107 10/17/2019 1657   CO2 21 (L) 10/17/2019 1657   GLUCOSE 136 (H) 10/17/2019 1657   BUN 25 (H) 10/17/2019 1657   CREATININE 1.14 (H) 10/17/2019 1657   CALCIUM 9.8 10/17/2019 1657   PROT 8.7 (H) 10/17/2019 1657   ALBUMIN 4.5 10/17/2019 1657   AST 40 10/17/2019 1657   ALT 31 10/17/2019 1657   ALKPHOS 56 10/17/2019 1657   BILITOT 1.2 10/17/2019 1657   GFRNONAA 51 (L) 10/17/2019 1657   GFRAA 59 (L) 10/17/2019 1657   Imaging I have reviewed the images obtained:  CT-scan of the brain-no acute changes.  No bleed.  No evolving stroke  MRI examination of the brain-no acute stroke.  Chronic white matter changes.  Assessment: 63 year old with history of  diabetes hypertension and prior episode of altered mental status which was presumed to be transient global amnesia in 2015, presents for episodes of altered mentation where she appears confused and has speech difficulty-predominantly garbled and slurred speech.  In between the spells she appears to be normal.  No history of seizures or bowel bladder incontinence to suggest any seizure activity. MRI negative for stroke. Labs reveal elevated CK as well as leukocytosis. UA and chest x-ray unremarkable for infection. Given the leukocytosis, confusion/altered mental status-it is prudent to rule out a CNS infection.  Not concerned so much about a bacterial infection since his symptoms have been going on for 3 to 4 days-would have expected patient to be much sicker but more concerned for any kind of viral encephalitis.  Less likely to be stroke/TIA as symptoms are waxing and waning-seizures could be also in the differentials.  All of the above said,  there is no clear history of whether the patient has any underlying memory deficits and the symptoms might be secondary to fluctuating memory.  The son says there is a significant family history but he could not tell me much great detail about the patient's baseline mentation.  Impression: Evaluate for altered mental status-etiology under investigation Broad differential at this time-work-up as below to include investigations for possible underlying seizures.  Recommendations: Discussed with the ED provider-recommend spinal tap.  Check HSV PCR in addition to cell count, protein, glucose, Gram stain and culture. Admit for observation Check B12 TSH, RPR, A1c, B1 (thiamine) levels Check ammonia levels Check urine toxicology Routine EEG in the morning COVID Test pending  Neurology will continue to follow with you.  -- Amie Portland, MD [facility] Pager: [phone] If 7pm to 7am, please call on call as listed on AMION.  ADDENDUM EDP  attempted bedside LP unsuccessful. Will need fluoro guided LP. 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 We will continue to follow.  -- Amie Portland, MD [facility] Pager: [phone] If 7pm to 7am, please call on call as listed on AMION.

## 2019-10-17 NOTE — ED Triage Notes (Signed)
Son states mother disoriented x 3 days sent here from [place] office for eval

## 2019-10-17 NOTE — ED Notes (Signed)
Pt unable to provide urine specimen at this time.  Dr Roslynn Amble made aware.  Calling for neuro consult

## 2019-10-17 NOTE — ED Notes (Signed)
Pt transferred from [place] via [place]. NAD. Dr. Langston Masker aware.

## 2019-10-17 NOTE — ED Provider Notes (Signed)
[facility]ERG[facility]MENT Provider Note   CSN: [id number] Arrival date & time: 10/17/19  1646     History   Chief Complaint Chief Complaint  Patient presents with   Altered Mental Status    HPI Monica Bauer is a 63 y.o. female presenting to emergency department with confusion.  Patient presents as a transfer from [facility] ER.  She presented there with her family concerns that the patient was not behaving like herself.  Her son tells me that the patient appeared very confused today. That the patient was rambling nonsensically and appeared to be more confused than her baseline.  Dr Roslynn Amble the Ed attending at [place] told me, "She was rambling nonsensically, saying eisenhower was the president," but denies that there were any other neurological deficits.  At [facility] the patient had labs and a CT scan of her brain, which did not show any acute infarct.  There were unable to obtain a urine sample prior to her transfer.  Her CBC showed a white count of 17.6.  She did have a CK level of 808.  Per Dr Reubin Milan at [facility] ED, the patient was transferred for MRI brain after a discussion with Dr Cheral Marker of neurology to evaluate for TIA.  Here in the Ed the patient appears have returned to her mental baseline.  Her son is present at the bedside and says "she is not confused anymore".  The patient denies any symptoms.  She looks at me and says "when am I getting the scan done?"    HPI  Past Medical History:  Diagnosis Date   Breast cancer ([place])    Diabetes mellitus without complication ([place])    Hypertension     There are no active problems to display for this patient.   Past Surgical History:  Procedure Laterality Date   BREAST LUMPECTOMY       OB History   No obstetric history on file.      Home Medications    Prior to Admission medications   Medication Sig Start Date End Date Taking? Authorizing Provider    eszopiclone (LUNESTA) 1 MG TABS tablet Take 1 tablet (1 mg total) by mouth at bedtime as needed for sleep. Take immediately before bedtime 09/10/14   Tanna Furry, MD  GLIPIZIDE PO Take by mouth.    [provider]  HYDRALAZINE-HCTZ PO Take by mouth.    [provider]  LOSARTAN POTASSIUM PO Take by mouth.    [provider]  UNKNOWN TO PATIENT Cancer med    [provider]    Family History History reviewed. No pertinent family history.  Social History Social History   Tobacco Use   Smoking status: Former Smoker  Substance Use Topics   Alcohol use: No   Drug use: Not on file     Allergies   Other   Review of Systems Review of Systems  Constitutional: Negative for chills and fever.  Eyes: Negative for photophobia and visual disturbance.  Respiratory: Negative for cough and shortness of breath.   Cardiovascular: Negative for chest pain and palpitations.  Gastrointestinal: Negative for nausea and vomiting.  Genitourinary: Negative for hematuria.  Neurological: Negative for dizziness, seizures, syncope, light-headedness and headaches.  Psychiatric/Behavioral: Positive for confusion.  All other systems reviewed and are negative.    Physical Exam Updated Vital Signs BP (!) 146/79    Pulse 65    Temp 98 F (36.7 C) (Oral)  Resp 19    Ht 5\' 2"  (1.575 m)    Wt 90.3 kg    SpO2 98%    BMI 36.40 kg/m   Physical Exam Vitals signs and nursing note reviewed.  Constitutional:      General: She is not in acute distress.    Appearance: She is well-developed.  HENT:     Head: Normocephalic and atraumatic.  Eyes:     Conjunctiva/sclera: Conjunctivae normal.  Neck:     Musculoskeletal: Neck supple.  Cardiovascular:     Rate and Rhythm: Normal rate and regular rhythm.     Pulses: Normal pulses.  Pulmonary:     Effort: Pulmonary effort is normal. No respiratory distress.     Breath sounds: Normal breath sounds.  Abdominal:      General: There is no distension.     Palpations: Abdomen is soft.  Skin:    General: Skin is warm and dry.  Neurological:     General: No focal deficit present.     Mental Status: She is alert and oriented to person, place, and time.      ED Treatments / Results  Labs (all labs ordered are listed, but only abnormal results are displayed) Labs Reviewed  CBC WITH DIFFERENTIAL/PLATELET - Abnormal; Notable for the following components:      Result Value   WBC 17.6 (*)    Neutro Abs 12.5 (*)    All other components within normal limits  COMPREHENSIVE METABOLIC PANEL - Abnormal; Notable for the following components:   CO2 21 (*)    Glucose, Bld 136 (*)    BUN 25 (*)    Creatinine, Ser 1.14 (*)    Total Protein 8.7 (*)    GFR calc non Af Amer 51 (*)    GFR calc Af Amer 59 (*)    All other components within normal limits  CK - Abnormal; Notable for the following components:   Total CK 808 (*)    All other components within normal limits  URINALYSIS, ROUTINE W REFLEX MICROSCOPIC - Abnormal; Notable for the following components:   Ketones, ur 80 (*)    Protein, ur 100 (*)    All other components within normal limits  SARS CORONAVIRUS 2 (TAT 6-24 HRS)  CSF CULTURE  MYOGLOBIN, URINE  CSF CELL COUNT WITH DIFFERENTIAL  PROTEIN AND GLUCOSE, CSF  HERPES SIMPLEX VIRUS(HSV) DNA BY PCR  VITAMIN B12  TSH  VITAMIN B1  HEMOGLOBIN A1C  RPR  AMMONIA    EKG EKG Interpretation  Date/Time:  Thursday October 17 2019 17:12:45 EST Ventricular Rate:  101 PR Interval:    QRS Duration: 94 QT Interval:  361 QTC Calculation: 471 R Axis:   -27 Text Interpretation: Sinus tachycardia no STEMI no change when compared to ecg on 09/10/2014 Confirmed by Madalyn Rob [phone]) on 10/17/2019 5:37:16 PM   Radiology Dg Chest 2 View  Result Date: 10/17/2019 CLINICAL DATA:  Leukocytosis, disorientation, altered mental status, history hypertension, diabetes mellitus, breast cancer EXAM: CHEST -  2 VIEW COMPARISON:  None FINDINGS: Enlargement of cardiac silhouette. Mediastinal contours and pulmonary vascularity normal. Central peribronchial thickening. No pulmonary infiltrate, pleural effusion, or pneumothorax. Scattered degenerative disc disease changes thoracic spine. IMPRESSION: Enlargement of cardiac silhouette. Mild bronchitic changes without infiltrate. Electronically Signed   By: Lavonia Dana M.D.   On: 10/17/2019 17:44   Ct Head Wo Contrast  Result Date: 10/17/2019 CLINICAL DATA:  Mental status changes. EXAM: CT HEAD WITHOUT CONTRAST TECHNIQUE: Contiguous axial images  were obtained from the base of the skull through the vertex without intravenous contrast. COMPARISON:  11/21/2014 FINDINGS: Brain: The ventricles are normal in size and configuration. No extra-axial fluid collections are identified. The gray-white differentiation is maintained. No CT findings for acute hemispheric infarction or intracranial hemorrhage. No mass lesions. The brainstem and cerebellum are normal. Vascular: No hyperdense vessels or obvious aneurysm. Skull: No acute skull fracture.  No bone lesion. Sinuses/Orbits: The paranasal sinuses and mastoid air cells are clear. The globes are intact. Other: No scalp lesions, laceration or hematoma. IMPRESSION: No acute intracranial findings. Electronically Signed   By: Marijo Sanes M.D.   On: 10/17/2019 17:13   Mr Brain Wo Contrast  Result Date: 10/17/2019 CLINICAL DATA:  63 year old female with altered mental status. EXAM: MRI HEAD WITHOUT CONTRAST TECHNIQUE: Multiplanar, multiecho pulse sequences of the brain and surrounding structures were obtained without intravenous contrast. COMPARISON:  Head CT earlier tonight. Report of [facility] brain MRI 11/18/2011 (no images available). FINDINGS: Brain: Cerebral volume is within normal limits for age. No restricted diffusion to suggest acute infarction. No midline shift, mass effect,  evidence of mass lesion, ventriculomegaly, extra-axial collection or acute intracranial hemorrhage. Cervicomedullary junction and pituitary are within normal limits. Scattered mild - or at most moderate for age - cerebral white matter T2 and FLAIR hyperintensity in a nonspecific configuration, most pronounced in the left corona radiata on series 6, image 15. No superimposed cortical encephalomalacia or chronic cerebral blood products. Negative deep gray nuclei, brainstem and cerebellum. Vascular: Major intracranial vascular flow voids are preserved. Skull and upper cervical spine: Upper cervical spine obscured by motion on sagittal T1 imaging. Visualized bone marrow signal is within normal limits. Sinuses/Orbits: Negative orbits. Paranasal sinuses are clear. Other: Visible internal auditory structures appear normal. Mastoids are clear. Scalp and face soft tissues appear negative. IMPRESSION: 1. No acute intracranial abnormality. 2. Mild for age nonspecific cerebral white matter signal changes, most commonly due to chronic small vessel disease. Electronically Signed   By: Genevie Ann M.D.   On: 10/17/2019 22:23    Procedures .Lumbar Puncture  Date/Time: 10/17/2019 11:42 PM Performed by: Wyvonnia Dusky, MD Authorized by: Wyvonnia Dusky, MD   Consent:    Consent obtained:  Verbal   Consent given by:  Patient (and son Monica Bauer)   Risks discussed:  Headache, bleeding, pain and repeat procedure   Alternatives discussed:  Delayed treatment Pre-procedure details:    Procedure purpose:  Diagnostic   Preparation: Patient was prepped and draped in usual sterile fashion   Sedation:    Sedation type:  Anxiolysis Anesthesia (see MAR for exact dosages):    Anesthesia method:  Local infiltration   Local anesthetic:  Lidocaine 1% w/o epi Procedure details:    Lumbar space:  L4-L5 interspace   Patient position:  R lateral decubitus   Needle gauge:  20   Ultrasound guidance: no     Number of attempts:   1 Post-procedure:    Puncture site:  Direct pressure applied and adhesive bandage applied Comments:     Unsuccessful LP due to patient habitus and inability to lie still .Critical Care Performed by: Wyvonnia Dusky, MD Authorized by: Wyvonnia Dusky, MD   Critical care provider statement:    Critical care time (minutes):  40   Critical care was necessary to treat or prevent imminent or life-threatening deterioration of the following conditions:  CNS failure or compromise   Critical care was  time spent personally by me on the following activities:  Discussions with consultants, evaluation of patient's response to treatment, examination of patient, ordering and performing treatments and interventions, ordering and review of laboratory studies, ordering and review of radiographic studies, pulse oximetry, re-evaluation of patient's condition, obtaining history from patient or surrogate and review of old charts Comments:     Encephalopathy requiring consultation with specialists and attempted bedside procedures, LP, and repeat neurological checks   (including critical care time)  Medications Ordered in ED Medications  midazolam (VERSED) injection 2 mg (2 mg Intravenous Given 10/17/19 2316)     Initial Impression / Assessment and Plan / ED Course  I have reviewed the triage vital signs and the nursing notes.  Pertinent labs & imaging results that were available during my care of the patient were reviewed by me and considered in my medical decision making (see chart for details).   62 year old female presenting with waxing and waning mental status for the past 3 days, not associated with any new medications.  Patient was altered and confused at [facility] on her initial assessment, with speaking nonsensically and repeating words.  She was transferred to [facility] emergency department to undergo MRI after negative CT scan, in order to rule out TIA.    Subsequently, her chest x-ray and UA have  not shown signs of infection.  She does have a leukocytosis but is afebrile.  She underwent MRI, which shows no acute findings consistent with TIA. I will reach out again to our neurology team.  The patient's mental status does appear to change dramatically in short periods of time.  Upon arrival here she was lucid and able to have a conversation, and seem to be fully oriented and understand why she was here.  However after returning from the MRI scan she was dramatically confused and repeating names and was not able to hold a conversation.  Subsequently 15 minutes later she was sitting up in bed and able to have a conversation with me.  The acuity of her symptoms is less suggestive of sundowning or dementia.  This appears to be delirium versus encephalitis (most likely viral if this is the case - she does NOT demonstrate meningismus or evidence of bacterial meningitis).     Clinical Course as of Oct 16 2345  Thu Oct 17, 2019  2242 After returning to the MRI, the patient is now markedly more confused when she arrived.  She is laying on the bed and repeating "no no, it was [employer], it was myla"   [MT]  2340 I attempted an LP at the behest of Dr. Berneice Gandy neurology, who was concerned for possible viral encephalitis.  Reported low concern for bacterial meningitis, which I agree with.  I had an unsuccessful LP at bedside, due to patient habitus and inability to lie still, despite IV versed.  I have ordered a dose of acyclovir per pharmacy to cover for encephalitis.  Medicine inpatient team may need to consult radiology for fluoroscopic LP in the a.m.   [MT]    Clinical Course User Index [MT] Langston Masker Carola Rhine, MD     Final Clinical Impressions(s) / ED Diagnoses   Final diagnoses:  Transient alteration of awareness  Confusion  Altered mental status, unspecified altered mental status type    ED Discharge Orders    None       Wyvonnia Dusky, MD 10/17/19 [phone]

## 2019-10-18 ENCOUNTER — Observation Stay (HOSPITAL_COMMUNITY): Payer: Medicare Other

## 2019-10-18 ENCOUNTER — Encounter (HOSPITAL_COMMUNITY): Payer: Self-pay | Admitting: Family Medicine

## 2019-10-18 DIAGNOSIS — G9340 Encephalopathy, unspecified: Secondary | ICD-10-CM | POA: Diagnosis not present

## 2019-10-18 DIAGNOSIS — R569 Unspecified convulsions: Secondary | ICD-10-CM | POA: Diagnosis present

## 2019-10-18 DIAGNOSIS — Z79811 Long term (current) use of aromatase inhibitors: Secondary | ICD-10-CM | POA: Diagnosis not present

## 2019-10-18 DIAGNOSIS — Z87891 Personal history of nicotine dependence: Secondary | ICD-10-CM | POA: Diagnosis not present

## 2019-10-18 DIAGNOSIS — C50919 Malignant neoplasm of unspecified site of unspecified female breast: Secondary | ICD-10-CM | POA: Diagnosis present

## 2019-10-18 DIAGNOSIS — I10 Essential (primary) hypertension: Secondary | ICD-10-CM | POA: Diagnosis present

## 2019-10-18 DIAGNOSIS — Z538 Procedure and treatment not carried out for other reasons: Secondary | ICD-10-CM | POA: Diagnosis present

## 2019-10-18 DIAGNOSIS — Z170 Estrogen receptor positive status [ER+]: Secondary | ICD-10-CM | POA: Diagnosis not present

## 2019-10-18 DIAGNOSIS — G4733 Obstructive sleep apnea (adult) (pediatric): Secondary | ICD-10-CM | POA: Diagnosis present

## 2019-10-18 DIAGNOSIS — E876 Hypokalemia: Secondary | ICD-10-CM | POA: Diagnosis not present

## 2019-10-18 DIAGNOSIS — R748 Abnormal levels of other serum enzymes: Secondary | ICD-10-CM | POA: Diagnosis present

## 2019-10-18 DIAGNOSIS — R4182 Altered mental status, unspecified: Secondary | ICD-10-CM | POA: Diagnosis present

## 2019-10-18 DIAGNOSIS — T426X5A Adverse effect of other antiepileptic and sedative-hypnotic drugs, initial encounter: Secondary | ICD-10-CM | POA: Diagnosis present

## 2019-10-18 DIAGNOSIS — Z853 Personal history of malignant neoplasm of breast: Secondary | ICD-10-CM | POA: Diagnosis not present

## 2019-10-18 DIAGNOSIS — N179 Acute kidney failure, unspecified: Secondary | ICD-10-CM | POA: Diagnosis present

## 2019-10-18 DIAGNOSIS — R32 Unspecified urinary incontinence: Secondary | ICD-10-CM | POA: Diagnosis not present

## 2019-10-18 DIAGNOSIS — E119 Type 2 diabetes mellitus without complications: Secondary | ICD-10-CM | POA: Diagnosis present

## 2019-10-18 DIAGNOSIS — G92 Toxic encephalopathy: Secondary | ICD-10-CM | POA: Diagnosis present

## 2019-10-18 DIAGNOSIS — R809 Proteinuria, unspecified: Secondary | ICD-10-CM | POA: Diagnosis present

## 2019-10-18 DIAGNOSIS — Z20828 Contact with and (suspected) exposure to other viral communicable diseases: Secondary | ICD-10-CM | POA: Diagnosis present

## 2019-10-18 DIAGNOSIS — R7989 Other specified abnormal findings of blood chemistry: Secondary | ICD-10-CM | POA: Diagnosis present

## 2019-10-18 DIAGNOSIS — E669 Obesity, unspecified: Secondary | ICD-10-CM | POA: Diagnosis present

## 2019-10-18 DIAGNOSIS — Z6832 Body mass index (BMI) 32.0-32.9, adult: Secondary | ICD-10-CM | POA: Diagnosis not present

## 2019-10-18 DIAGNOSIS — R824 Acetonuria: Secondary | ICD-10-CM | POA: Diagnosis present

## 2019-10-18 DIAGNOSIS — E860 Dehydration: Secondary | ICD-10-CM | POA: Diagnosis present

## 2019-10-18 DIAGNOSIS — N289 Disorder of kidney and ureter, unspecified: Secondary | ICD-10-CM

## 2019-10-18 DIAGNOSIS — T375X5A Adverse effect of antiviral drugs, initial encounter: Secondary | ICD-10-CM | POA: Diagnosis present

## 2019-10-18 LAB — BASIC METABOLIC PANEL
Anion gap: 17 — ABNORMAL HIGH (ref 5–15)
BUN: 21 mg/dL (ref 8–23)
CO2: 21 mmol/L — ABNORMAL LOW (ref 22–32)
Calcium: 9.5 mg/dL (ref 8.9–10.3)
Chloride: 106 mmol/L (ref 98–111)
Creatinine, Ser: 1.07 mg/dL — ABNORMAL HIGH (ref 0.44–1.00)
GFR calc Af Amer: >60 mL/min (ref >60)
GFR calc non Af Amer: 55 mL/min — ABNORMAL LOW (ref >60)
Glucose, Bld: 94 mg/dL (ref 70–99)
Potassium: 3.4 mmol/L — ABNORMAL LOW (ref 3.5–5.1)
Sodium: 144 mmol/L (ref 135–145)

## 2019-10-18 LAB — CBC WITH DIFFERENTIAL/PLATELET
Abs Immature Granulocytes: 0.03 10*3/uL (ref 0.00–0.07)
Basophils Absolute: 0.1 10*3/uL (ref 0.0–0.1)
Basophils Relative: 1 %
Eosinophils Absolute: 0.1 10*3/uL (ref 0.0–0.5)
Eosinophils Relative: 0 %
HCT: 41.1 % (ref 36.0–46.0)
Hemoglobin: 12.4 g/dL (ref 12.0–15.0)
Immature Granulocytes: 0 %
Lymphocytes Relative: 31 %
Lymphs Abs: 4.1 10*3/uL — ABNORMAL HIGH (ref 0.7–4.0)
MCH: 28.5 pg (ref 26.0–34.0)
MCHC: 30.2 g/dL (ref 30.0–36.0)
MCV: 94.5 fL (ref 80.0–100.0)
Monocytes Absolute: 1.4 10*3/uL — ABNORMAL HIGH (ref 0.1–1.0)
Monocytes Relative: 10 %
Neutro Abs: 7.7 10*3/uL (ref 1.7–7.7)
Neutrophils Relative %: 58 %
Platelets: 245 10*3/uL (ref 150–400)
RBC: 4.35 MIL/uL (ref 3.87–5.11)
RDW: 14.5 % (ref 11.5–15.5)
WBC: 13.2 10*3/uL — ABNORMAL HIGH (ref 4.0–10.5)
nRBC: 0 % (ref 0.0–0.2)

## 2019-10-18 LAB — GLUCOSE, CAPILLARY
Glucose-Capillary: 84 mg/dL (ref 70–99)
Glucose-Capillary: 77 mg/dL (ref 70–99)
Glucose-Capillary: 94 mg/dL (ref 70–99)

## 2019-10-18 LAB — HEMOGLOBIN A1C
Hgb A1c MFr Bld: 6.7 % — ABNORMAL HIGH (ref 4.8–5.6)
Mean Plasma Glucose: 145.59 mg/dL

## 2019-10-18 LAB — RAPID URINE DRUG SCREEN, HOSP PERFORMED
Amphetamines: NOT DETECTED
Barbiturates: NOT DETECTED
Benzodiazepines: NOT DETECTED
Cocaine: NOT DETECTED
Opiates: NOT DETECTED
Tetrahydrocannabinol: NOT DETECTED

## 2019-10-18 LAB — CK: Total CK: 680 U/L — ABNORMAL HIGH (ref 38–234)

## 2019-10-18 LAB — CBG MONITORING, ED
Glucose-Capillary: 82 mg/dL (ref 70–99)
Glucose-Capillary: 92 mg/dL (ref 70–99)
Glucose-Capillary: 96 mg/dL (ref 70–99)

## 2019-10-18 LAB — VITAMIN B12: Vitamin B-12: 5711 pg/mL — ABNORMAL HIGH (ref 180–914)

## 2019-10-18 LAB — RPR: RPR Ser Ql: NONREACTIVE

## 2019-10-18 LAB — SARS CORONAVIRUS 2 (TAT 6-24 HRS): SARS Coronavirus 2: NEGATIVE

## 2019-10-18 LAB — TSH: TSH: 0.964 u[IU]/mL (ref 0.350–4.500)

## 2019-10-18 LAB — AMMONIA: Ammonia: 39 umol/L — ABNORMAL HIGH (ref 9–35)

## 2019-10-18 LAB — HIV ANTIBODY (ROUTINE TESTING W REFLEX): HIV Screen 4th Generation wRfx: NONREACTIVE

## 2019-10-18 IMAGING — CT CT ANGIO HEAD
3 of 7 series · 10 of 35 positions shown · IV contrast (omnipaque)
Comparison: Brain MRI [DATE], head CT [DATE]

CLINICAL DATA: Focal neuro deficit, greater than 6 hours, stroke
suspected. Additional history provided: 63-year-old female with
altered mental status. EEG to evaluate for seizure and acute and
cephalopathy.

EXAM:
CT ANGIOGRAPHY HEAD AND NECK
TECHNIQUE: Multidetector CT imaging of the head and neck was performed using
the standard protocol during bolus administration of intravenous
contrast. Multiplanar CT image reconstructions and MIPs were
obtained to evaluate the vascular anatomy. Carotid stenosis
measurements (when applicable) are obtained utilizing NASCET
criteria, using the distal internal carotid diameter as the
denominator.
CONTRAST:  75mL OMNIPAQUE IOHEXOL 350 MG/ML SOLN

[Series 6: cta neck · axial · 0.46mm/px · z∈[-153,-47]mm · 2 of 161 slices shown]
[im 54/161  soft-tissue]
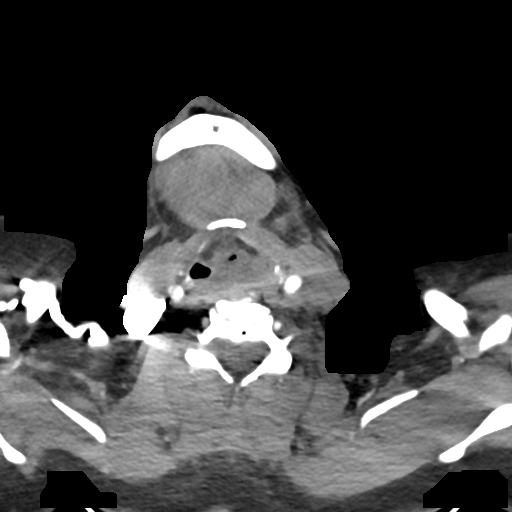
[im 107/161  soft-tissue]
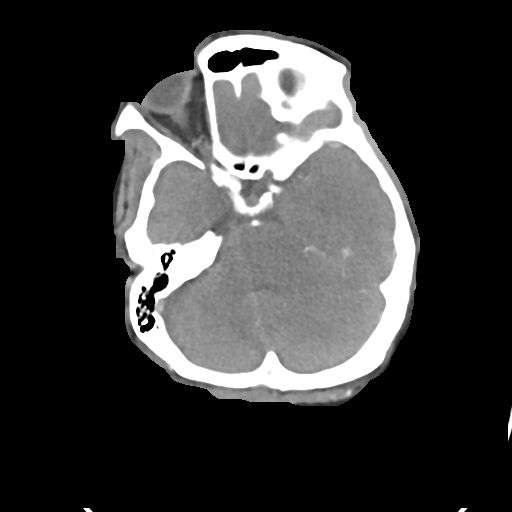

[Series 8: cta neck axial · axial · 0.39mm/px · z∈[-228,-6]mm · 6 of 320 slices shown]
[im 46/320  soft-tissue]
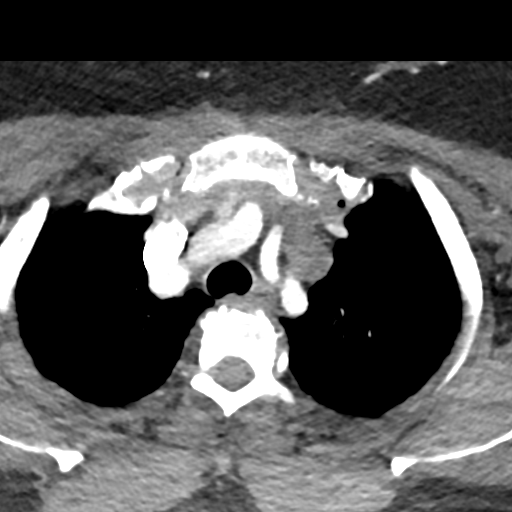
[im 92/320  bone]
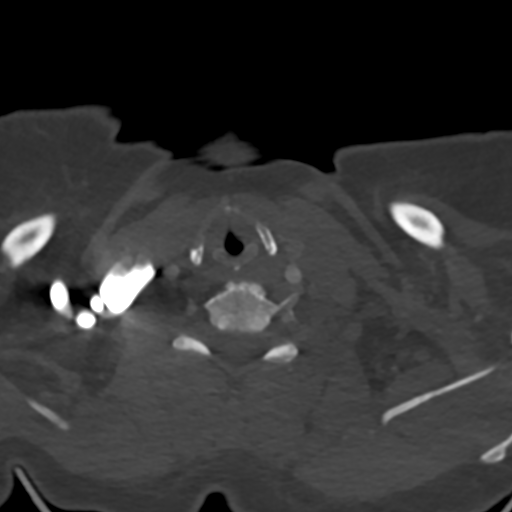
[im 137/320  soft-tissue]
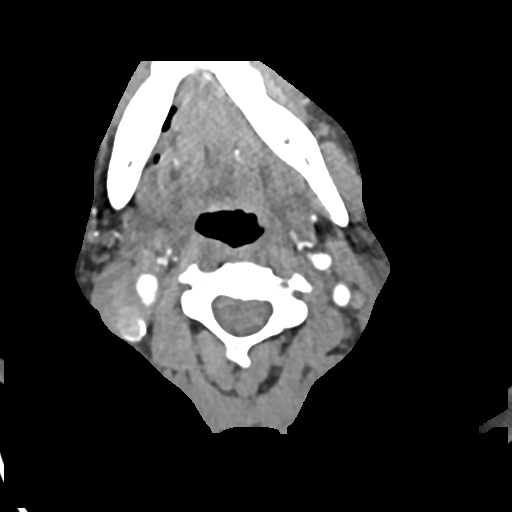
[im 183/320  bone]
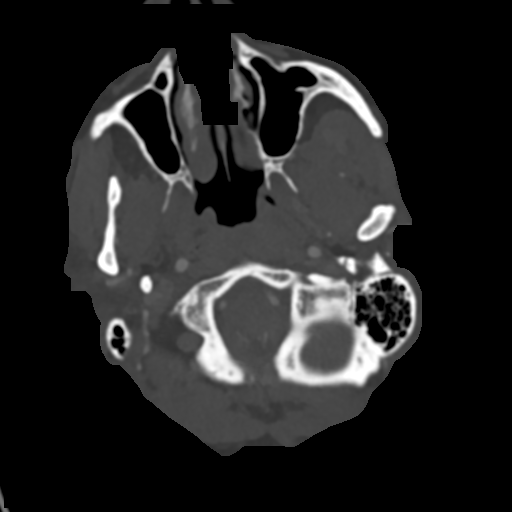
[im 228/320  soft-tissue]
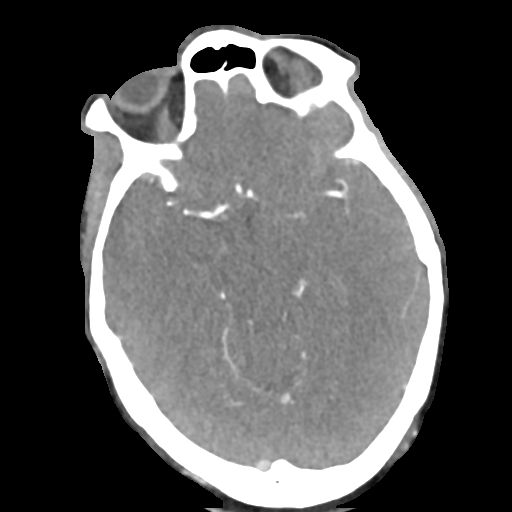
[im 274/320  bone]
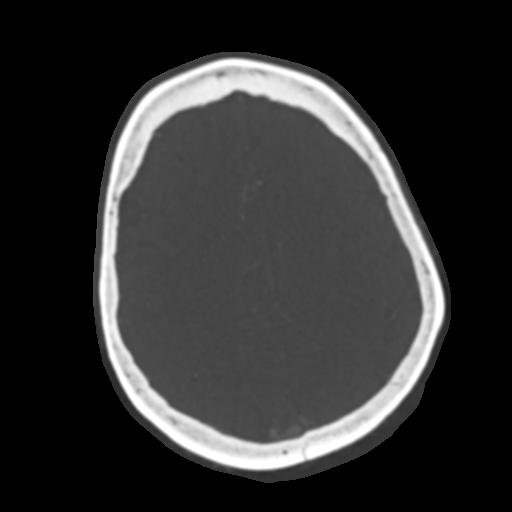

[Series 10: cta neck sagittal · sagittal · 0.51mm/px · 2 of 167 slices shown]
[im 34/167  soft-tissue]
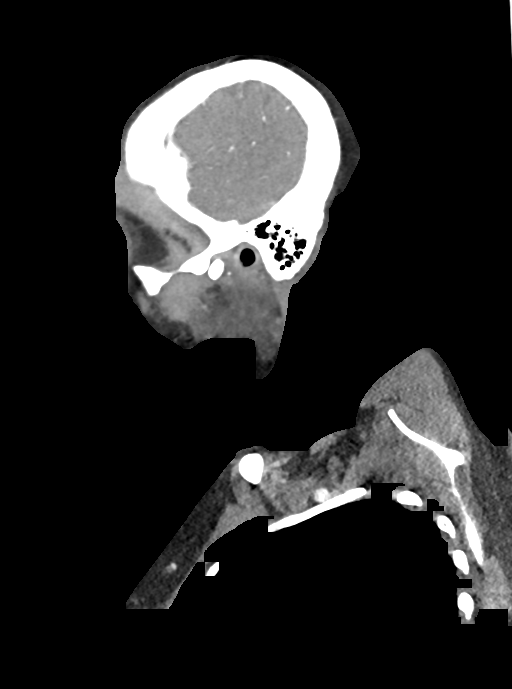
[im 134/167  soft-tissue]
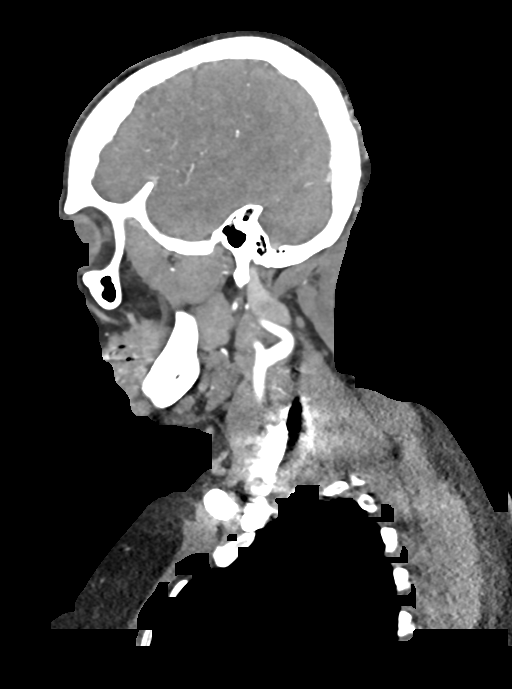

[10 of 35 positions shown; findings below may reference images not displayed]

FINDINGS: CTA NECK FINDINGS

Aortic arch: Standard aortic branching.

Right carotid system: CCA and ICA widely patent within the neck
without stenosis

Left carotid system: CCA and ICA widely patent within the neck
without stenosis. Mild mixed plaque at the carotid bifurcation.

Vertebral arteries: Codominant. Vertebral arteries patent within the
neck without significant stenosis (50% or greater).

Skeleton: No acute bony abnormality. Cervical spondylosis. Most
notably, there is severe disc degeneration at C3-C4 and C5-C6.
Shallow posterior disc osteophytes at these levels. No high-grade
bony spinal canal stenosis. Mild C4-C5 degenerative grade 1
anterolisthesis.

Other neck: No neck mass or pathologically enlarged cervical chain
lymph nodes.

Upper chest: The imaged lung apices are clear. Prominence of the
visualized main pulmonary artery, measuring 3.6 cm in diameter,
suggestive of pulmonary hypertension.

Review of the MIP images confirms the above findings

CTA HEAD FINDINGS

Anterior circulation:

The intracranial internal carotid arteries are patent. Mild
calcified plaque within the paraclinoid right ICA without
significant stenosis. The bilateral middle and anterior cerebral
arteries are patent without significant proximal stenosis. No
intracranial aneurysm is identified.

Posterior circulation:

The intracranial vertebral arteries are patent without significant
stenosis, as is the basilar artery. Predominantly fetal origin of
the bilateral posterior cerebral arteries. The bilateral posterior
cerebral arteries are patent without significant proximal stenosis.

Venous sinuses: Within limitations of contrast timing, no convincing
thrombus. Dominant right transverse and sigmoid dural venous
sinuses.

Anatomic variants: As described.

Review of the MIP images confirms the above findings
IMPRESSION: CTA neck:

1. Common carotid, internal carotid and vertebral arteries patent
within the neck without significant stenosis.
2. Prominence of the visualized main pulmonary artery, suggestive of
pulmonary hypertension.

CTA head:

1. No intracranial large vessel occlusion or proximal high-grade
arterial stenosis.
2. Mild calcified plaque within the paraclinoid right ICA.

## 2019-10-18 IMAGING — RF DG C-ARM 1-60 MIN
1 series · 1 of 1 positions shown · non-contrast
Comparison: none

CLINICAL DATA: Encephalopathy with leukocytosis

[Series 1: cp_standard · 0.25mm/px · 1 of 1 slices shown]
[im 1/1]
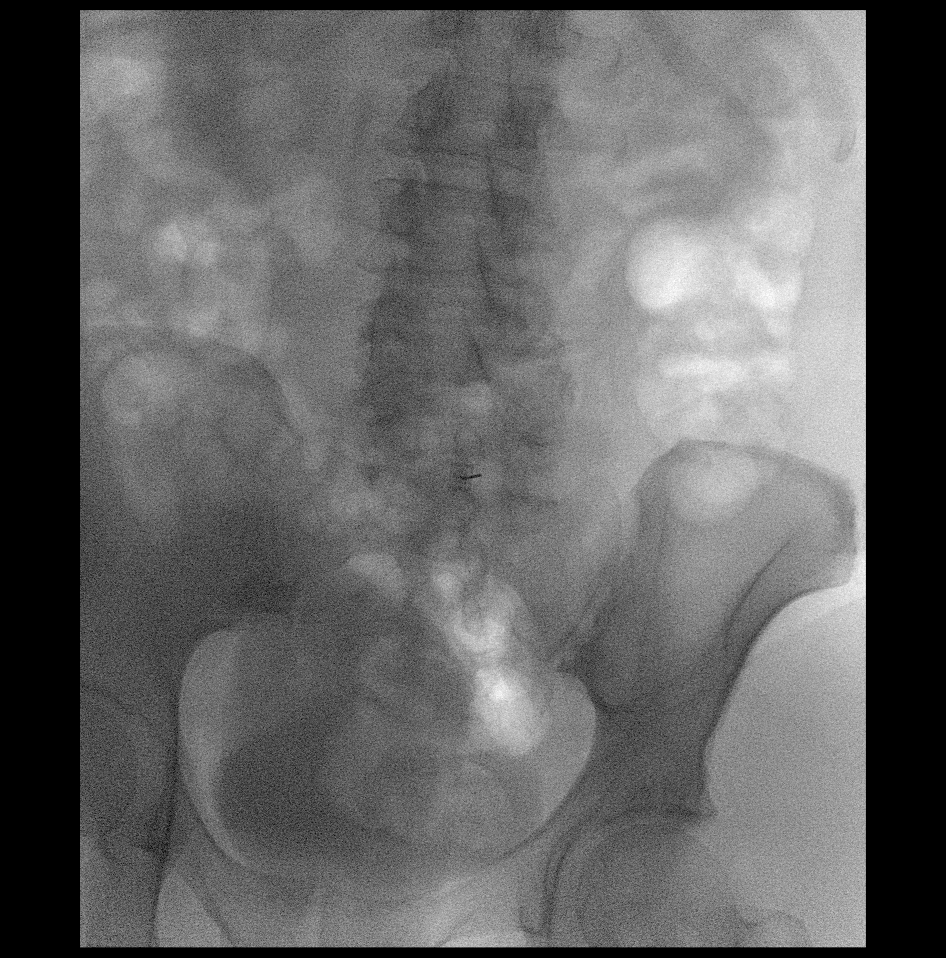

[1 of 1 positions shown; findings below may reference images not displayed]

EXAM:
DIAGNOSTIC LUMBAR PUNCTURE UNDER FLUOROSCOPIC GUIDANCE

FLUOROSCOPY TIME:  Fluoroscopy Time:  1 minutes 12 seconds

Radiation Exposure Index (if provided by the fluoroscopic device):
14.5 mGy

Number of Acquired Spot Images: 1

PROCEDURE:
Informed consent was obtained from the patient prior to the
procedure, including potential complications of headache, allergy,
and pain. With the patient prone, the lower back was prepped with
Betadine. 1% Lidocaine was used for local anesthesia.

Lumbar puncture was attempted at the L4-5 and L5-S1 levels using a
20 gauge needle. Patient complained of significant pain/discomfort,
particularly with subcutaneous lidocaine administration.
Unfortunately, despite multiple attempts at two different levels,
the procedure was unsuccessful.

The patient tolerated the attempted procedure well and there were no
apparent complications.
IMPRESSION: Unsuccessful fluoroscopic guided lumbar puncture attempted at L4-5
and L5-S1.

## 2019-10-18 IMAGING — CT CT ANGIO NECK
3 of 7 series · 10 of 35 positions shown · IV contrast (OMNI 350)
Comparison: Brain MRI [DATE], head CT [DATE]

CLINICAL DATA: Focal neuro deficit, greater than 6 hours, stroke
suspected. Additional history provided: 63-year-old female with
altered mental status. EEG to evaluate for seizure and acute and
cephalopathy.

EXAM:
CT ANGIOGRAPHY HEAD AND NECK
TECHNIQUE: Multidetector CT imaging of the head and neck was performed using
the standard protocol during bolus administration of intravenous
contrast. Multiplanar CT image reconstructions and MIPs were
obtained to evaluate the vascular anatomy. Carotid stenosis
measurements (when applicable) are obtained utilizing NASCET
criteria, using the distal internal carotid diameter as the
denominator.
CONTRAST:  75mL OMNIPAQUE IOHEXOL 350 MG/ML SOLN

[Series 6: cta neck · axial · 0.46mm/px · z∈[-153,-47]mm · 2 of 161 slices shown]
[im 54/161  soft-tissue]
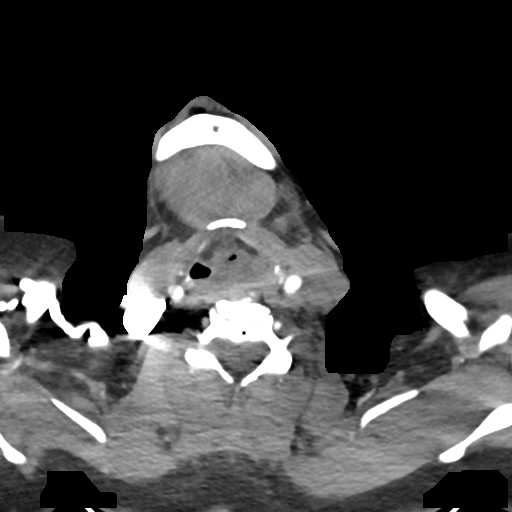
[im 107/161  soft-tissue]
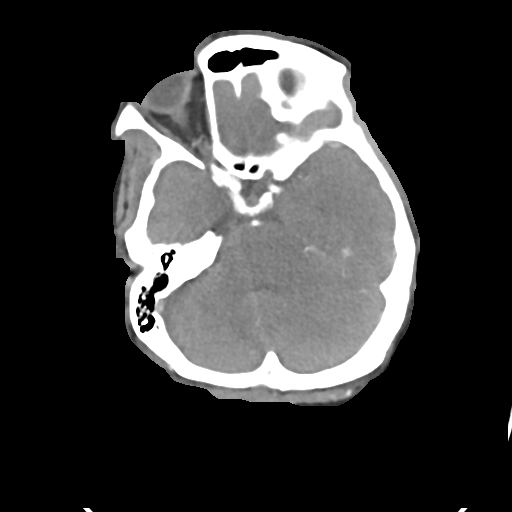

[Series 8: cta neck axial · axial · 0.39mm/px · z∈[-228,-6]mm · 6 of 320 slices shown]
[im 46/320  soft-tissue]
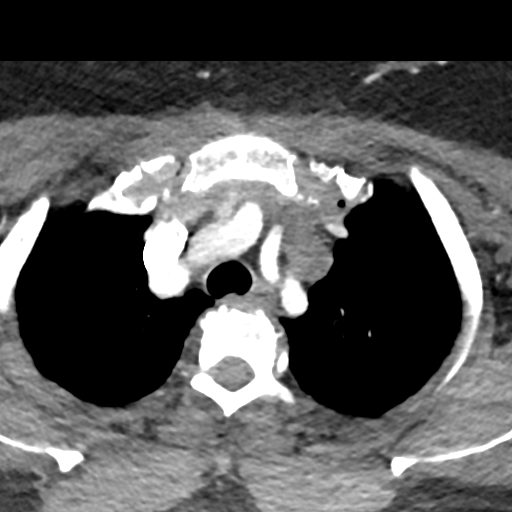
[im 92/320  bone]
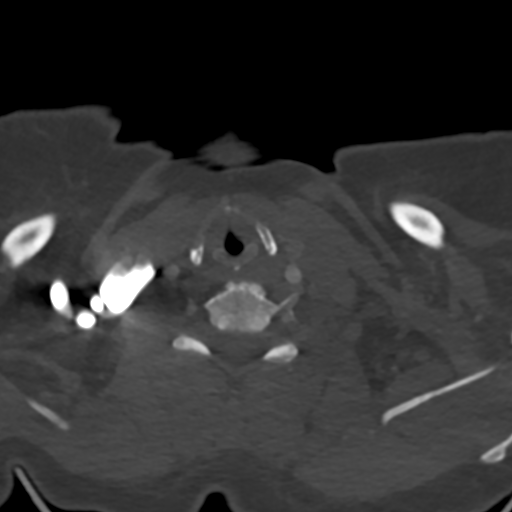
[im 137/320  soft-tissue]
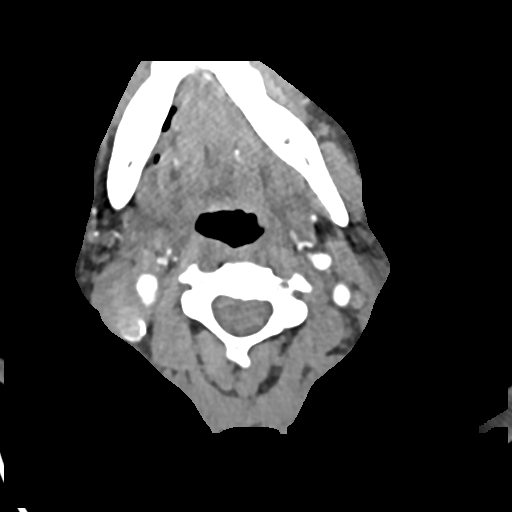
[im 183/320  bone]
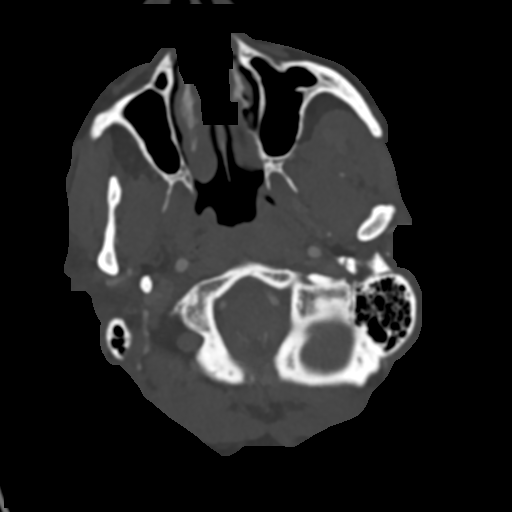
[im 228/320  soft-tissue]
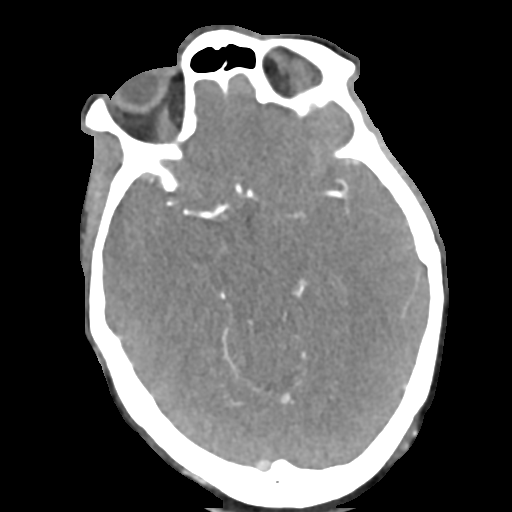
[im 274/320  bone]
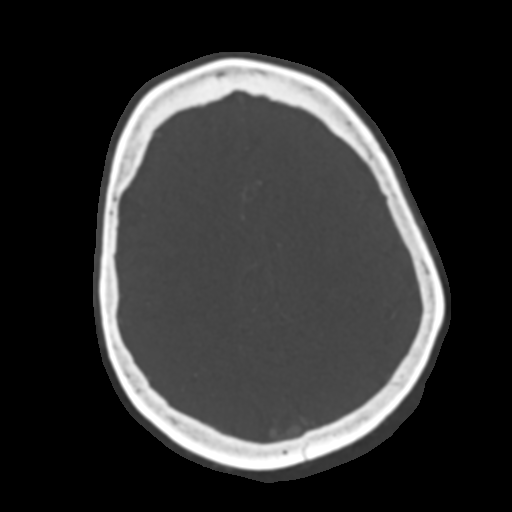

[Series 10: cta neck sagittal · sagittal · 0.51mm/px · 2 of 167 slices shown]
[im 34/167  soft-tissue]
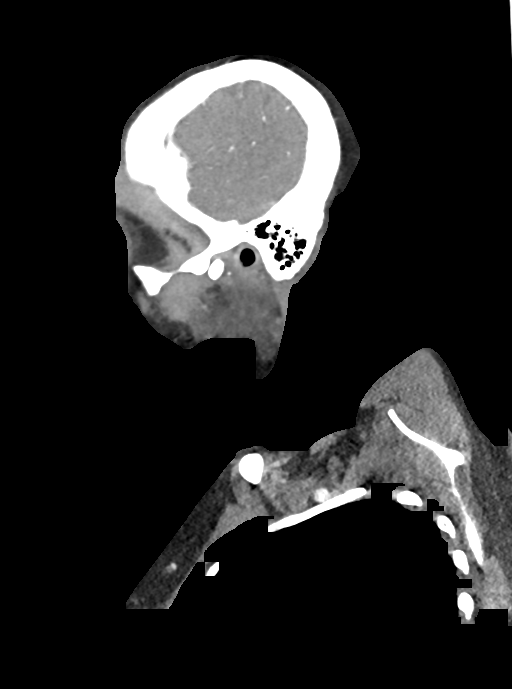
[im 134/167  soft-tissue]
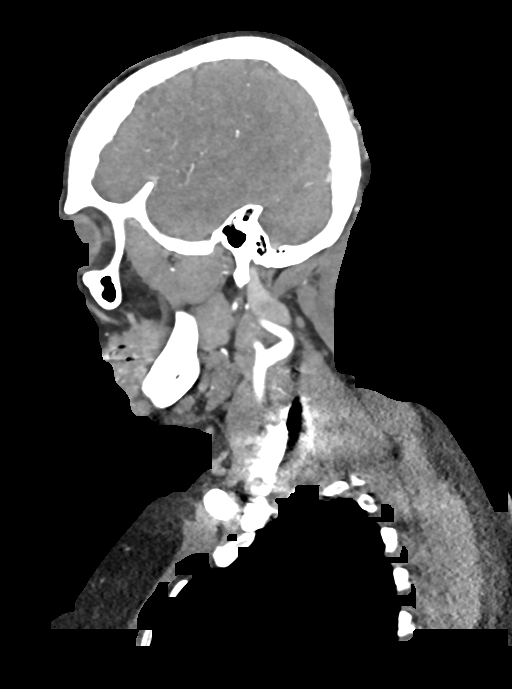

[10 of 35 positions shown; findings below may reference images not displayed]

FINDINGS: CTA NECK FINDINGS

Aortic arch: Standard aortic branching.

Right carotid system: CCA and ICA widely patent within the neck
without stenosis

Left carotid system: CCA and ICA widely patent within the neck
without stenosis. Mild mixed plaque at the carotid bifurcation.

Vertebral arteries: Codominant. Vertebral arteries patent within the
neck without significant stenosis (50% or greater).

Skeleton: No acute bony abnormality. Cervical spondylosis. Most
notably, there is severe disc degeneration at C3-C4 and C5-C6.
Shallow posterior disc osteophytes at these levels. No high-grade
bony spinal canal stenosis. Mild C4-C5 degenerative grade 1
anterolisthesis.

Other neck: No neck mass or pathologically enlarged cervical chain
lymph nodes.

Upper chest: The imaged lung apices are clear. Prominence of the
visualized main pulmonary artery, measuring 3.6 cm in diameter,
suggestive of pulmonary hypertension.

Review of the MIP images confirms the above findings

CTA HEAD FINDINGS

Anterior circulation:

The intracranial internal carotid arteries are patent. Mild
calcified plaque within the paraclinoid right ICA without
significant stenosis. The bilateral middle and anterior cerebral
arteries are patent without significant proximal stenosis. No
intracranial aneurysm is identified.

Posterior circulation:

The intracranial vertebral arteries are patent without significant
stenosis, as is the basilar artery. Predominantly fetal origin of
the bilateral posterior cerebral arteries. The bilateral posterior
cerebral arteries are patent without significant proximal stenosis.

Venous sinuses: Within limitations of contrast timing, no convincing
thrombus. Dominant right transverse and sigmoid dural venous
sinuses.

Anatomic variants: As described.

Review of the MIP images confirms the above findings
IMPRESSION: CTA neck:

1. Common carotid, internal carotid and vertebral arteries patent
within the neck without significant stenosis.
2. Prominence of the visualized main pulmonary artery, suggestive of
pulmonary hypertension.

CTA head:

1. No intracranial large vessel occlusion or proximal high-grade
arterial stenosis.
2. Mild calcified plaque within the paraclinoid right ICA.

## 2019-10-18 MED ORDER — LIDOCAINE HCL (PF) 1 % IJ SOLN
INTRAMUSCULAR | Status: AC
  Filled 2019-10-18: qty 5

## 2019-10-18 MED ORDER — HYDROCODONE-ACETAMINOPHEN 5-325 MG PO TABS
1.0000 | ORAL_TABLET | Freq: Four times a day (QID) | ORAL | Status: DC | PRN
  Administered 2019-10-22: 1 via ORAL
  Filled 2019-10-18: qty 1

## 2019-10-18 MED ORDER — LIP MEDEX EX OINT
TOPICAL_OINTMENT | CUTANEOUS | Status: DC | PRN
  Filled 2019-10-18: qty 7

## 2019-10-18 MED ORDER — SODIUM CHLORIDE 0.9% FLUSH
3.0000 mL | Freq: Two times a day (BID) | INTRAVENOUS | Status: DC
  Administered 2019-10-18 – 2019-10-28 (×14): 3 mL via INTRAVENOUS

## 2019-10-18 MED ORDER — ACETAMINOPHEN 650 MG RE SUPP: 650.0000 mg | Freq: Four times a day (QID) | RECTAL | Status: DC | PRN

## 2019-10-18 MED ORDER — LABETALOL HCL 5 MG/ML IV SOLN: 10.0000 mg | INTRAVENOUS | Status: DC | PRN

## 2019-10-18 MED ORDER — DEXTROSE 5 % IV SOLN
10.0000 mg/kg | Freq: Three times a day (TID) | INTRAVENOUS | Status: DC
  Administered 2019-10-18 – 2019-10-19 (×3): 660 mg via INTRAVENOUS
  Filled 2019-10-18 (×9): qty 13.2

## 2019-10-18 MED ORDER — LIDOCAINE HCL (PF) 1 % IJ SOLN: 5.0000 mL | Freq: Once | INTRAMUSCULAR | Status: AC

## 2019-10-18 MED ORDER — POLYETHYLENE GLYCOL 3350 17 G PO PACK: 17.0000 g | PACK | Freq: Every day | ORAL | Status: DC | PRN

## 2019-10-18 MED ORDER — SODIUM CHLORIDE 0.9 % IV SOLN
INTRAVENOUS | Status: AC
  Administered 2019-10-18: 02:00:00 via INTRAVENOUS

## 2019-10-18 MED ORDER — POTASSIUM CHLORIDE CRYS ER 20 MEQ PO TBCR
40.0000 meq | EXTENDED_RELEASE_TABLET | Freq: Once | ORAL | Status: AC
  Administered 2019-10-18: 40 meq via ORAL
  Filled 2019-10-18: qty 2

## 2019-10-18 MED ORDER — ONDANSETRON HCL 4 MG PO TABS: 4.0000 mg | ORAL_TABLET | Freq: Four times a day (QID) | ORAL | Status: DC | PRN

## 2019-10-18 MED ORDER — IOHEXOL 350 MG/ML SOLN
75.0000 mL | Freq: Once | INTRAVENOUS | Status: AC | PRN
  Administered 2019-10-18: 75 mL via INTRAVENOUS

## 2019-10-18 MED ORDER — INSULIN ASPART 100 UNIT/ML ~~LOC~~ SOLN
0.0000 [IU] | SUBCUTANEOUS | Status: DC
  Administered 2019-10-19: 1 [IU] via SUBCUTANEOUS
  Administered 2019-10-19: 3 [IU] via SUBCUTANEOUS

## 2019-10-18 MED ORDER — ACETAMINOPHEN 325 MG PO TABS: 650.0000 mg | ORAL_TABLET | Freq: Four times a day (QID) | ORAL | Status: DC | PRN

## 2019-10-18 MED ORDER — ONDANSETRON HCL 4 MG/2ML IJ SOLN: 4.0000 mg | Freq: Four times a day (QID) | INTRAMUSCULAR | Status: DC | PRN

## 2019-10-18 NOTE — ED Notes (Signed)
Pt rang out asking for her son. Upon entering the room the pt had the IV almost pulled out of her arm. This RN was able to get the IV back in place and secured with coban. Paused the infusion because the pt would not keep her arm straight. IV pump kept alarming that the IV was occluded. This RN checked the patency of the site and same was able to be flushed and is patent.

## 2019-10-18 NOTE — Progress Notes (Addendum)
NEURO HOSPITALIST PROGRESS NOTE   Subjective: Patient awake, alert, in bed, NAD.   Exam: Vitals:   10/18/19 0730 10/18/19 0800  BP: 125/90 (!) 133/110  Pulse: 91 88  Resp: 16 16  Temp:    SpO2: 97% 95%    Physical Exam  Constitutional: Appears well-developed and well-nourished.  Psych: Affect appropriate to situation Eyes: Normal external eye and conjunctiva. HENT: Normocephalic, no lesions, without obvious abnormality.   Musculoskeletal-no joint tenderness, deformity or swelling Cardiovascular: Normal rate and regular rhythm.  Respiratory: Effort normal, non-labored breathing saturations WNL on RA GI: Soft.  No distension. There is no tenderness.  Skin: WDI   Neuro:  Mental Status: Alert, oriented, thought content appropriate.  Speech fluent without evidence of aphasia.  Able to follow commands without difficulty. Cranial Nerves: [id number] fields grossly normal,  III,IV, VI: ptosis not present, extra-ocular motions intact bilaterally pupils equal, round, reactive to light and accommodation V,VII: smile symmetric, facial light touch sensation normal bilaterally VIII: hearing normal bilaterally IX,X: uvula rises symmetrically XI: bilateral shoulder shrug XII: midline tongue extension Motor: Right : Upper extremity   5/5  Left:     Upper extremity   5/5  Lower extremity   5/5   Lower extremity   5/5 Tone and bulk:normal tone throughout; no atrophy noted Sensory:  light touch intact throughout, bilaterally Deep Tendon Reflexes: 2+ and symmetric biceps, patella Plantars: Right: downgoing   Left: downgoing Cerebellar: normal finger-to-nose,  Gait: deferred    Medications:  Scheduled: . insulin aspart  0-9 Units Subcutaneous Q4H  . sodium chloride flush  3 mL Intravenous Q12H   Continuous: . acyclovir     [id number] **OR** acetaminophen, HYDROcodone-acetaminophen, labetalol, ondansetron **OR** ondansetron (ZOFRAN) IV, polyethylene  glycol  Pertinent Labs/Diagnostics:  rEEG 10/18/19: IMPRESSION: This study is within normal limits. No seizures or epileptiform discharges were seen throughout the recording.  B12 : 5711 TSH:WNL  RPR: pending A1c: 6.7 B1 (thiamine) : pending  ammonia : 39 UDS: pending COVID Test:  Negative  Dg Chest 2 View  Result Date: 10/17/2019 CLINICAL DATA:  Leukocytosis, disorientation, altered mental status, history hypertension, diabetes mellitus, breast cancer EXAM: CHEST - 2 VIEW COMPARISON:  None FINDINGS: Enlargement of cardiac silhouette. Mediastinal contours and pulmonary vascularity normal. Central peribronchial thickening. No pulmonary infiltrate, pleural effusion, or pneumothorax. Scattered degenerative disc disease changes thoracic spine. IMPRESSION: Enlargement of cardiac silhouette. Mild bronchitic changes without infiltrate. Electronically Signed   By: Lavonia Dana M.D.   On: 10/17/2019 17:44   Ct Head Wo Contrast  Result Date: 10/17/2019 CLINICAL DATA:  Mental status changes. EXAM: CT HEAD WITHOUT CONTRAST TECHNIQUE: Contiguous axial images were obtained from the base of the skull through the vertex without intravenous contrast. COMPARISON:  11/21/2014 FINDINGS: Brain: The ventricles are normal in size and configuration. No extra-axial fluid collections are identified. The gray-white differentiation is maintained. No CT findings for acute hemispheric infarction or intracranial hemorrhage. No mass lesions. The brainstem and cerebellum are normal. Vascular: No hyperdense vessels or obvious aneurysm. Skull: No acute skull fracture.  No bone lesion. Sinuses/Orbits: The paranasal sinuses and mastoid air cells are clear. The globes are intact. Other: No scalp lesions, laceration or hematoma. IMPRESSION: No acute intracranial findings. Electronically Signed   By: Marijo Sanes M.D.   On: 10/17/2019 17:13   Mr Brain Wo Contrast  Result Date: 10/17/2019 CLINICAL DATA:  63 year old  female with  altered mental status. EXAM: MRI HEAD WITHOUT CONTRAST TECHNIQUE: Multiplanar, multiecho pulse sequences of the brain and surrounding structures were obtained without intravenous contrast. COMPARISON:  Head CT earlier tonight. Report of [facility] brain MRI 11/18/2011 (no images available). FINDINGS: Brain: Cerebral volume is within normal limits for age. No restricted diffusion to suggest acute infarction. No midline shift, mass effect, evidence of mass lesion, ventriculomegaly, extra-axial collection or acute intracranial hemorrhage. Cervicomedullary junction and pituitary are within normal limits. Scattered mild - or at most moderate for age - cerebral white matter T2 and FLAIR hyperintensity in a nonspecific configuration, most pronounced in the left corona radiata on series 6, image 15. No superimposed cortical encephalomalacia or chronic cerebral blood products. Negative deep gray nuclei, brainstem and cerebellum. Vascular: Major intracranial vascular flow voids are preserved. Skull and upper cervical spine: Upper cervical spine obscured by motion on sagittal T1 imaging. Visualized bone marrow signal is within normal limits. Sinuses/Orbits: Negative orbits. Paranasal sinuses are clear. Other: Visible internal auditory structures appear normal. Mastoids are clear. Scalp and face soft tissues appear negative. IMPRESSION: 1. No acute intracranial abnormality. 2. Mild for age nonspecific cerebral white matter signal changes, most commonly due to chronic small vessel disease. Electronically Signed   By: Genevie Ann M.D.   On: 10/17/2019 22:23   EEG: This study is within normal limits. No seizures or epileptiform discharges were seen throughout the recording.  Assessment: 63 year old with history of diabetes, hypertension and prior episode of altered mental status which was presumed to be transient global amnesia in 2015, presents for episodes of altered mentation where she  appears confused and has speech difficulty-predominantly garbled and slurred speech.  In between the spells she appears to be normal.  No history of seizures or bowel bladder incontinence to suggest any seizure activity. -- MRI negative for acute stroke or other acute/subacute focal lesion.  -- Labs reveal elevated CK as well as leukocytosis. -- UA and chest x-ray unremarkable for infection. -- Given the leukocytosis, confusion/altered mental status-it is prudent to rule out a CNS infection. Not concerned so much about a bacterial infection since her symptoms have been going on for 3 to 4 days-would have expected patient to be much sicker but more concerned for any kind of viral encephalitis. LP- was attempted unsuccessfully in ED. Order placed for fluoro guided LP.   Less likely to be stroke/TIA as symptoms are waxing and waning-seizures could be also in the differentials. -- All of the above said, there is no clear history of whether the patient has any underlying memory deficits and the symptoms might be secondary to fluctuating memory.  The son says there is a significant family history of dementia but he could not give much great detail about the patient's baseline mentation.  Impression: -- Evaluate for altered mental status-etiology under investigation -- EEG was normal.  -- Broad differential at this time-work-up as below.   Recommendations: -- Fluoro guided LP-.  Check HSV PCR in addition to cell count, protein, glucose, Gram stain and culture. -- CTA head and neck to assess for possible critical stenosis in left MCA distribution, with special attention to vessels supplying the perisylvian cortices -- Continue acyclovir for HSV encephalitis, until LP and  results obtained. -- Neurology will continue to follow with you.   Laurey Morale, MSN, NP-C [facility] [phone]  Addendum: -- Radiology was unable to obtain fluoro-guided LP due to patient agitation. Will need  to  continue acyclovir for full course.  -- May need repeat EEG as outpatient to further assess the possibility of partial complex epilepsy as the etiology for her transient spells of AMS -- Will also need outpatient neurology/neuropsychology evaluations for possible incipient dementia. This requires batteries of cognitive tests not available in a hospital setting.     Electronically signed: Dr. Kerney Elbe 10/18/2019, 10:34 AM

## 2019-10-18 NOTE — Progress Notes (Signed)
Pharmacy Antibiotic Note  Monica Bauer is a 63 y.o. female admitted on 10/17/2019 with AMS, poss viral encephalitis.  Pharmacy has been consulted for acyclovir dosing. Pt is afebrile and WBC is elevated. Unable to do an LP thus far.   Plan: Change acyclovir to 10mg /kg IV Q8H based upon adjusted body weight F/u renal fxn, C&S, clinical status  Height: 5\' 2"  (157.5 cm) Weight: 199 lb (90.3 kg) IBW/kg (Calculated) : 50.1  Temp (24hrs), Avg:98.3 F (36.8 C), Min:98 F (36.7 C), Max:98.6 F (37 C)  Recent Labs  Lab 10/17/19 1657 10/18/19 0404  WBC 17.6* 13.2*  CREATININE 1.14* 1.07*    Estimated Creatinine Clearance: 56.2 mL/min (A) (by C-G formula based on SCr of 1.07 mg/dL (H)).    Allergies  Allergen Reactions  . Other     Pt does not know med allergy    Monica Bauer, Monica Bauer 10/18/2019 8:35 AM

## 2019-10-18 NOTE — ED Notes (Signed)
ED TO INPATIENT HANDOFF REPORT  ED Nurse Name and Phone #: Jori Moll [phone]  S Name/Age/Gender Monica Bauer 63 y.o. female Room/Bed: 045C/045C  Code Status   Code Status: Full Code  Home/SNF/Other Home Patient oriented to: self self Is this baseline? No   Triage Complete: Triage complete  Chief Complaint NEED BLOOD WORK DONE, URINE DARK  Triage Note Son states mother disoriented x 3 days sent here from PMD office for eval   Allergies Allergies  Allergen Reactions  . Other     Pt does not know med allergy    Level of Care/Admitting Diagnosis ED Disposition    ED Disposition Condition [place]: [place] [[id number]]  Level of Care: Telemetry Medical [104]  I expect the patient will be discharged within 24 hours: Yes  LOW acuity---Tx typically complete <24 hrs---ACUTE conditions typically can be evaluated <24 hours---LABS likely to return to acceptable levels <24 hours---IS near functional baseline---EXPECTED to return to current living arrangement---NOT newly hypoxic: Does not meet criteria for 5C-Observation unit  Covid Evaluation: Asymptomatic Screening Protocol (No Symptoms)  Diagnosis: Acute encephalopathy [id number]  Admitting Physician: Vianne Bulls [id number]  Attending Physician: Vianne Bulls [id number]  PT Class (Do Not Modify): Observation [104]  PT Acc Code (Do Not Modify): Observation [10022]       B Medical/Surgery History Past Medical History:  Diagnosis Date  . Breast cancer ([place])   . Diabetes mellitus without complication ([place])   . Hypertension    Past Surgical History:  Procedure Laterality Date  . BREAST LUMPECTOMY       A IV Location/Drains/Wounds Patient Lines/Drains/Airways Status   Active Line/Drains/Airways    Name:   Placement date:   Placement time:   Site:   Days:   Peripheral IV 10/18/19 Right Antecubital   10/18/19    0849    Antecubital   less than 1          Intake/Output  Last 24 hours No intake or output data in the 24 hours ending 10/18/19 1116  Labs/Imaging Results for orders placed or performed during the hospital encounter of 10/17/19 (from the past 48 hour(s))  CBC with Differential     Status: Abnormal   Collection Time: 10/17/19  4:57 PM  Result Value Ref Range   WBC 17.6 (H) 4.0 - 10.5 K/uL   RBC 4.50 3.87 - 5.11 MIL/uL   Hemoglobin 12.7 12.0 - 15.0 g/dL   HCT 38.6 36.0 - 46.0 %   MCV 85.8 80.0 - 100.0 fL   MCH 28.2 26.0 - 34.0 pg   MCHC 32.9 30.0 - 36.0 g/dL   RDW 14.5 11.5 - 15.5 %   Platelets 299 150 - 400 K/uL   nRBC 0.0 0.0 - 0.2 %   Neutrophils Relative % 71 %   Neutro Abs 12.5 (H) 1.7 - 7.7 K/uL   Lymphocytes Relative 23 %   Lymphs Abs 4.0 0.7 - 4.0 K/uL   Monocytes Relative 6 %   Monocytes Absolute 1.0 0.1 - 1.0 K/uL   Eosinophils Relative 0 %   Eosinophils Absolute 0.0 0.0 - 0.5 K/uL   Basophils Relative 0 %   Basophils Absolute 0.1 0.0 - 0.1 K/uL   Immature Granulocytes 0 %   Abs Immature Granulocytes 0.06 0.00 - 0.07 K/uL    Comment: Performed at [facility], [address] [place], [place] [address]  Comprehensive metabolic panel     Status: Abnormal  Collection Time: 10/17/19  4:57 PM  Result Value Ref Range   Sodium 139 135 - 145 mmol/L   Potassium 3.7 3.5 - 5.1 mmol/L   Chloride 107 98 - 111 mmol/L   CO2 21 (L) 22 - 32 mmol/L   Glucose, Bld 136 (H) 70 - 99 mg/dL   BUN 25 (H) 8 - 23 mg/dL   Creatinine, Ser 1.14 (H) 0.44 - 1.00 mg/dL   Calcium 9.8 8.9 - 10.3 mg/dL   Total Protein 8.7 (H) 6.5 - 8.1 g/dL   Albumin 4.5 3.5 - 5.0 g/dL   AST 40 15 - 41 U/L   ALT 31 0 - 44 U/L   Alkaline Phosphatase 56 38 - 126 U/L   Total Bilirubin 1.2 0.3 - 1.2 mg/dL   GFR calc non Af Amer 51 (L) >60 mL/min   GFR calc Af Amer 59 (L) >60 mL/min   Anion gap 11 5 - 15    Comment: Performed at [facility], [address] [place], [place] [address]  CK     Status: Abnormal   Collection Time: 10/17/19  4:57 PM   Result Value Ref Range   Total CK 808 (H) 38 - 234 U/L    Comment: Performed at [facility], [address] [place], [place] [address]  SARS CORONAVIRUS 2 (TAT 6-24 HRS) Nasopharyngeal Nasopharyngeal Swab     Status: None   Collection Time: 10/17/19  6:35 PM   Specimen: Nasopharyngeal Swab  Result Value Ref Range   SARS Coronavirus 2 NEGATIVE NEGATIVE    Comment: (NOTE) SARS-CoV-2 target nucleic acids are NOT DETECTED. The SARS-CoV-2 RNA is generally detectable in upper and lower respiratory specimens during the acute phase of infection. Negative results do not preclude SARS-CoV-2 infection, do not rule out co-infections with other pathogens, and should not be used as the sole basis for treatment or other patient management decisions. Negative results must be combined with clinical observations, patient history, and epidemiological information. The expected result is Negative. Fact Sheet for Patients: SugarRoll.be Fact Sheet for Healthcare Providers: https://www.woods-mathews.com/ This test is not yet approved or cleared by the [place] FDA and  has been authorized for detection and/or diagnosis of SARS-CoV-2 by FDA under an Emergency Use Authorization (EUA). This EUA will remain  in effect (meaning this test can be used) for the duration of the COVID-19 declaration under Section 56 4(b)(1) of the Act, 21 U.S.C. section 360bbb-3(b)(1), unless the authorization is terminated or revoked sooner. Performed at [facility], [place] [address] [place], [place] [address]   Urinalysis, Routine w reflex microscopic     Status: Abnormal   Collection Time: 10/17/19  9:20 PM  Result Value Ref Range   Color, Urine YELLOW YELLOW   APPearance CLEAR CLEAR   Specific Gravity, Urine 1.028 1.005 - 1.030   pH 5.0 5.0 - 8.0   Glucose, UA NEGATIVE NEGATIVE mg/dL   Hgb urine dipstick NEGATIVE NEGATIVE   Bilirubin Urine NEGATIVE  NEGATIVE   Ketones, ur 80 (A) NEGATIVE mg/dL   Protein, ur 100 (A) NEGATIVE mg/dL   Nitrite NEGATIVE NEGATIVE   Leukocytes,Ua NEGATIVE NEGATIVE   RBC / HPF 0-5 0 - 5 RBC/hpf   WBC, UA 0-5 0 - 5 WBC/hpf   Bacteria, UA NONE SEEN NONE SEEN   Squamous Epithelial / LPF 0-5 0 - 5   Mucus PRESENT     Comment: Performed at [facility], [place]  [address] [place], [place] [address]  Vitamin B12     Status: Abnormal   Collection Time: 10/18/19  1:13 AM  Result Value Ref Range   Vitamin B-12 5,711 (H) 180 - 914 pg/mL    Comment: (NOTE) This assay is not validated for testing neonatal or myeloproliferative syndrome specimens for Vitamin B12 levels. Performed at [facility], [place] [address], [place], [place] [address]   TSH     Status: None   Collection Time: 10/18/19  1:13 AM  Result Value Ref Range   TSH 0.964 0.350 - 4.500 uIU/mL    Comment: Performed by a 3rd Generation assay with a functional sensitivity of <=0.01 uIU/mL. Performed at [facility], [place] [address] [place], [place] [address]   Hemoglobin A1c     Status: Abnormal   Collection Time: 10/18/19  1:13 AM  Result Value Ref Range   Hgb A1c MFr Bld 6.7 (H) 4.8 - 5.6 %    Comment: (NOTE) Pre diabetes:          5.7%-6.4% Diabetes:              >6.4% Glycemic control for   <7.0% adults with diabetes    Mean Plasma Glucose 145.59 mg/dL    Comment: Performed at [facility] [address], [place], [place] [address]  Ammonia     Status: Abnormal   Collection Time: 10/18/19  1:16 AM  Result Value Ref Range   Ammonia 39 (H) 9 - 35 umol/L    Comment: Performed at [facility], [place] [address], [place], [place] [address]  CBG monitoring, ED     Status: None   Collection Time: 10/18/19  2:01 AM  Result Value Ref Range   Glucose-Capillary 82 70 - 99 mg/dL  HIV Antibody (routine testing w rflx)     Status: None   Collection Time: 10/18/19  4:04 AM  Result Value Ref Range   HIV Screen 4th Generation  wRfx NON REACTIVE NON REACTIVE    Comment: Performed at [facility], [place] [address] [place], [place] [id number]  Basic metabolic panel     Status: Abnormal   Collection Time: 10/18/19  4:04 AM  Result Value Ref Range   Sodium 144 135 - 145 mmol/L   Potassium 3.4 (L) 3.5 - 5.1 mmol/L   Chloride 106 98 - 111 mmol/L   CO2 21 (L) 22 - 32 mmol/L   Glucose, Bld 94 70 - 99 mg/dL   BUN 21 8 - 23 mg/dL   Creatinine, Ser 1.07 (H) 0.44 - 1.00 mg/dL   Calcium 9.5 8.9 - 10.3 mg/dL   GFR calc non Af Amer 55 (L) >60 mL/min   GFR calc Af Amer >60 >60 mL/min   Anion gap 17 (H) 5 - 15    Comment: Performed at [facility], [place] [address], [place], [place] [address]  CBC WITH DIFFERENTIAL     Status: Abnormal   Collection Time: 10/18/19  4:04 AM  Result Value Ref Range   WBC 13.2 (H) 4.0 - 10.5 K/uL   RBC 4.35 3.87 - 5.11 MIL/uL   Hemoglobin 12.4 12.0 - 15.0 g/dL   HCT 41.1 36.0 - 46.0 %   MCV 94.5 80.0 - 100.0 fL   MCH 28.5 26.0 - 34.0 pg   MCHC 30.2 30.0 - 36.0 g/dL   RDW 14.5 11.5 - 15.5 %   Platelets 245 150 - 400 K/uL  nRBC 0.0 0.0 - 0.2 %   Neutrophils Relative % 58 %   Neutro Abs 7.7 1.7 - 7.7 K/uL   Lymphocytes Relative 31 %   Lymphs Abs 4.1 (H) 0.7 - 4.0 K/uL   Monocytes Relative 10 %   Monocytes Absolute 1.4 (H) 0.1 - 1.0 K/uL   Eosinophils Relative 0 %   Eosinophils Absolute 0.1 0.0 - 0.5 K/uL   Basophils Relative 1 %   Basophils Absolute 0.1 0.0 - 0.1 K/uL   Immature Granulocytes 0 %   Abs Immature Granulocytes 0.03 0.00 - 0.07 K/uL    Comment: Performed at [facility] [address] [place], [place] [address]  CK     Status: Abnormal   Collection Time: 10/18/19  4:04 AM  Result Value Ref Range   Total CK 680 (H) 38 - 234 U/L    Comment: Performed at [facility], [place] [address], [place], [place] [address]  CBG monitoring, ED     Status: None   Collection Time: 10/18/19  5:20 AM  Result Value Ref Range   Glucose-Capillary 92 70 - 99 mg/dL  CBG  monitoring, ED     Status: None   Collection Time: 10/18/19 10:28 AM  Result Value Ref Range   Glucose-Capillary 96 70 - 99 mg/dL   Dg Chest 2 View  Result Date: 10/17/2019 CLINICAL DATA:  Leukocytosis, disorientation, altered mental status, history hypertension, diabetes mellitus, breast cancer EXAM: CHEST - 2 VIEW COMPARISON:  None FINDINGS: Enlargement of cardiac silhouette. Mediastinal contours and pulmonary vascularity normal. Central peribronchial thickening. No pulmonary infiltrate, pleural effusion, or pneumothorax. Scattered degenerative disc disease changes thoracic spine. IMPRESSION: Enlargement of cardiac silhouette. Mild bronchitic changes without infiltrate. Electronically Signed   By: Lavonia Dana M.D.   On: 10/17/2019 17:44   Ct Head Wo Contrast  Result Date: 10/17/2019 CLINICAL DATA:  Mental status changes. EXAM: CT HEAD WITHOUT CONTRAST TECHNIQUE: Contiguous axial images were obtained from the base of the skull through the vertex without intravenous contrast. COMPARISON:  11/21/2014 FINDINGS: Brain: The ventricles are normal in size and configuration. No extra-axial fluid collections are identified. The gray-white differentiation is maintained. No CT findings for acute hemispheric infarction or intracranial hemorrhage. No mass lesions. The brainstem and cerebellum are normal. Vascular: No hyperdense vessels or obvious aneurysm. Skull: No acute skull fracture.  No bone lesion. Sinuses/Orbits: The paranasal sinuses and mastoid air cells are clear. The globes are intact. Other: No scalp lesions, laceration or hematoma. IMPRESSION: No acute intracranial findings. Electronically Signed   By: Marijo Sanes M.D.   On: 10/17/2019 17:13   Mr Brain Wo Contrast  Result Date: 10/17/2019 CLINICAL DATA:  63 year old female with altered mental status. EXAM: MRI HEAD WITHOUT CONTRAST TECHNIQUE: Multiplanar, multiecho pulse sequences of the brain and surrounding structures were obtained without  intravenous contrast. COMPARISON:  Head CT earlier tonight. Report of [facility] brain MRI 11/18/2011 (no images available). FINDINGS: Brain: Cerebral volume is within normal limits for age. No restricted diffusion to suggest acute infarction. No midline shift, mass effect, evidence of mass lesion, ventriculomegaly, extra-axial collection or acute intracranial hemorrhage. Cervicomedullary junction and pituitary are within normal limits. Scattered mild - or at most moderate for age - cerebral white matter T2 and FLAIR hyperintensity in a nonspecific configuration, most pronounced in the left corona radiata on series 6, image 15. No superimposed cortical encephalomalacia or chronic cerebral blood products. Negative deep gray nuclei, brainstem  and cerebellum. Vascular: Major intracranial vascular flow voids are preserved. Skull and upper cervical spine: Upper cervical spine obscured by motion on sagittal T1 imaging. Visualized bone marrow signal is within normal limits. Sinuses/Orbits: Negative orbits. Paranasal sinuses are clear. Other: Visible internal auditory structures appear normal. Mastoids are clear. Scalp and face soft tissues appear negative. IMPRESSION: 1. No acute intracranial abnormality. 2. Mild for age nonspecific cerebral white matter signal changes, most commonly due to chronic small vessel disease. Electronically Signed   By: Genevie Ann M.D.   On: 10/17/2019 22:23    Pending Labs Unresulted Labs (From admission, onward)    Start     Ordered   10/25/19 0500  Creatinine, serum  (enoxaparin (LOVENOX)    CrCl >/= 30 ml/min)  Weekly,   R    Comments: while on enoxaparin therapy    10/18/19 0143   10/18/19 0200  Vitamin B1  Once,   R     10/18/19 0200   10/18/19 0144  Urine rapid drug screen (hosp performed)  Add-on,   AD     10/18/19 0143   10/17/19 2327  RPR  Once,   STAT     10/17/19 2327   10/17/19 2313  Herpes simplex virus (HSV), DNA by PCR  Cerebrospinal Fluid  Once,   STAT     10/17/19 2313   10/17/19 2312  CSF cell count with differential  Once,   STAT    Question:  Are there also cytology or pathology orders on this specimen?  Answer:  No   10/17/19 2313   10/17/19 2312  Protein and glucose, CSF  Once,   STAT     10/17/19 2313   10/17/19 2312  CSF culture with Stat gram stain  Once,   STAT    Question:  Are there also cytology or pathology orders on this specimen?  Answer:  No   10/17/19 2313   10/17/19 1726  Myoglobin, urine  Once,   STAT     10/17/19 1725          Vitals/Pain Today's Vitals   10/18/19 0700 10/18/19 0730 10/18/19 0800 10/18/19 1053  BP: (!) 132/121 125/90 (!) 133/110 120/80  Pulse: (!) 117 91 88 76  Resp: 18 16 16 16   Temp:      TempSrc:      SpO2: 94% 97% 95% 100%  Weight:      Height:      PainSc:        Isolation Precautions No active isolations  Medications Medications  insulin aspart (novoLOG) injection 0-9 Units (0 Units Subcutaneous Not Given 10/18/19 1044)  sodium chloride flush (NS) 0.9 % injection 3 mL (3 mLs Intravenous Given 10/18/19 0728)  0.9 %  sodium chloride infusion ( Intravenous Stopped 10/18/19 0750)  acetaminophen (TYLENOL) tablet 650 mg (has no administration in time range)    Or  acetaminophen (TYLENOL) suppository 650 mg (has no administration in time range)  HYDROcodone-acetaminophen (NORCO/VICODIN) 5-325 MG per tablet 1 tablet (has no administration in time range)  polyethylene glycol (MIRALAX / GLYCOLAX) packet 17 g (has no administration in time range)  ondansetron (ZOFRAN) tablet 4 mg (has no administration in time range)    Or  ondansetron (ZOFRAN) injection 4 mg (has no administration in time range)  labetalol (NORMODYNE) injection 10 mg (has no administration in time range)  acyclovir (ZOVIRAX) 660 mg in dextrose 5 % 100 mL IVPB (has no administration in time range)  midazolam (VERSED) injection 2  mg (2 mg Intravenous Given 10/17/19 2316)  acyclovir  (ZOVIRAX) 750 mg in dextrose 5 % 150 mL IVPB (0 mg Intravenous Stopped 10/18/19 0300)    Mobility walks Low fall risk   Focused Assessments Neuro Assessment Handoff:  Swallow screen pass? Yes          Neuro Assessment: Exceptions to WDL Neuro Checks:      Last Documented NIHSS Modified Score:   Has TPA been given? No If patient is a Neuro Trauma and patient is going to OR before floor call report to [place] nurse: [phone] or [phone]     R Recommendations: See Admitting Provider Note  Report given to:   Additional Notes: LP in process

## 2019-10-18 NOTE — Procedures (Signed)
Patient Name: Monica Bauer  MRN: [id number]  Epilepsy Attending: Lora Havens  Referring Physician/Provider: Dr. Mitzi Hansen Date: 10/18/2019 Duration: 24.11 minutes  Patient history: 63 year old female with altered mental status.  EEG to evaluate for seizure.  Level of alertness: Awake  AEDs during EEG study: None  Technical aspects: This EEG study was done with scalp electrodes positioned according to the 10-20 International system of electrode placement. Electrical activity was acquired at a sampling rate of 500Hz  and reviewed with a high frequency filter of 70Hz  and a low frequency filter of 1Hz . EEG data were recorded continuously and digitally stored.   Description: The posterior dominant rhythm consists of 10 Hz activity of moderate voltage (25-35 uV) seen predominantly in posterior head regions, symmetric and reactive to eye opening and eye closing.  Physiologic photic driving was not seen during photic stimulation.  Hyperventilation was not performed.          IMPRESSION: This study is within normal limits. No seizures or epileptiform discharges were seen throughout the recording.  [facility] [name]

## 2019-10-18 NOTE — Progress Notes (Signed)
Portable EEG completed, results pending. 

## 2019-10-18 NOTE — ED Notes (Signed)
Patient undergoing EEG.

## 2019-10-18 NOTE — ED Notes (Signed)
Patient transported to LP

## 2019-10-18 NOTE — H&P (Signed)
History and Physical    Andilynn Guardiola [id number] DOB: 08-Nov-1956 DOA: 10/17/2019  PCP: Thornton Dales I, MD   Patient coming from: Home   Chief Complaint: Confusion   HPI: Makailyn Fedele is a 63 y.o. female with medical history significant for hypertension, breast cancer, and type 2 diabetes mellitus, presenting to the emergency department for evaluation of confusion.  Patient was brought in to the ED by her son who has been concerned for waxing and waning confusion over the past 3 to 4 days.  Patient seemed to be in her usual state of health until she seemed to be slightly confused on 10/13/2019.  She has seemed to be lucid at times, but overall seems to be worsening, not caring for herself, and calling family in the middle of the night with marked confusion and garbled speech.  Patient is normally alert and fully oriented, manages all of her ADLs, and also cares for her elderly mother.    Patient is quite confused at time of admission and unable to contribute to the history.  She is repeating questions but does not answering them.  ED Course: Upon arrival to the ED, patient is found to be afebrile, saturating well on room air, and with stable blood pressure.  EKG features sinus rhythm with rate 1 1.  Chest x-ray is notable for enlarged cardiac silhouette and mild bronchitic changes.  Noncontrast head CT is negative for acute intracranial abnormality.  MRI brain is negative for acute intracranial abnormality.  Chemistry panel notable for creatinine 1.14.  Serum CK is 808.  CBC features a leukocytosis 17,600.  Urinalysis notable for ketonuria and proteinuria.  COVID-19 screening test is in process.  Neurology was consulted by the ED physician and recommended lumbar puncture, additional blood work, routine EEG, and observation on the medical service.  LP was attempted by the ED physician, but unsuccessful, patient was started on acyclovir empirically, and IR will be consulted for LP under  fluoroscopy.  Review of Systems:  Unable to complete ROS secondary to the patient's clinical condition.  Past Medical History:  Diagnosis Date   Breast cancer ([place])    Diabetes mellitus without complication ([place])    Hypertension     Past Surgical History:  Procedure Laterality Date   BREAST LUMPECTOMY       reports that she has quit smoking. She does not have any smokeless tobacco history on file. She reports that she does not drink alcohol. No history on file for drug.  Allergies  Allergen Reactions   Other     Pt does not know med allergy    History reviewed. No pertinent family history.   Prior to Admission medications   Medication Sig Start Date End Date Taking? Authorizing Provider  eszopiclone (LUNESTA) 1 MG TABS tablet Take 1 tablet (1 mg total) by mouth at bedtime as needed for sleep. Take immediately before bedtime 09/10/14   Tanna Furry, MD  GLIPIZIDE PO Take by mouth.    [provider]  HYDRALAZINE-HCTZ PO Take by mouth.    [provider]  LOSARTAN POTASSIUM PO Take by mouth.    [provider]  UNKNOWN TO PATIENT Cancer med    [provider]    Physical Exam: Vitals:   10/17/19 2025 10/17/19 2300 10/17/19 2330 10/18/19 0000  BP: (!) 173/153 (!) 146/79    Pulse: 72 75 65 64  Resp: 19     Temp: 98 F (36.7 C)     TempSrc: Oral  SpO2: 100% 95% 98% 97%  Weight:      Height:        Constitutional: NAD, calm  Eyes: PERTLA, lids and conjunctivae normal ENMT: Mucous membranes are moist. Posterior pharynx clear of any exudate or lesions.   Neck: normal, supple, no masses, no thyromegaly Respiratory: no wheezing, no crackles. No accessory muscle use.  Cardiovascular: S1 & S2 heard, regular rate and rhythm. No extremity edema.  Abdomen: No distension, no tenderness, soft. Bowel sounds active.  Musculoskeletal: no clubbing / cyanosis. No joint deformity upper and lower extremities. Normal muscle tone.  Skin: no  significant rashes, lesions, ulcers. Warm, dry, well-perfused. Neurologic: No gross facial asymmetry. PERRL. Patellar DTRs intact. Moving all extremities spontaneously.  Psychiatric:  Alert and oriented to person only. Repeating questions but not answering them. Holding her hands up during interview and studying them as though she has never seen them before.  Calm.    Labs on Admission: I have personally reviewed following labs and imaging studies  CBC: Recent Labs  Lab 10/17/19 1657  WBC 17.6*  NEUTROABS 12.5*  HGB 12.7  HCT 38.6  MCV 85.8  PLT 123XX123   Basic Metabolic Panel: Recent Labs  Lab 10/17/19 1657  NA 139  K 3.7  CL 107  CO2 21*  GLUCOSE 136*  BUN 25*  CREATININE 1.14*  CALCIUM 9.8   GFR: Estimated Creatinine Clearance: 52.8 mL/min (A) (by C-G formula based on SCr of 1.14 mg/dL (H)). Liver Function Tests: Recent Labs  Lab 10/17/19 1657  AST 40  ALT 31  ALKPHOS 56  BILITOT 1.2  PROT 8.7*  ALBUMIN 4.5   No results for input(s): LIPASE, AMYLASE in the last 168 hours. No results for input(s): AMMONIA in the last 168 hours. Coagulation Profile: No results for input(s): INR, PROTIME in the last 168 hours. Cardiac Enzymes: Recent Labs  Lab 10/17/19 1657  CKTOTAL 808*   BNP (last 3 results) No results for input(s): PROBNP in the last 8760 hours. HbA1C: No results for input(s): HGBA1C in the last 72 hours. CBG: No results for input(s): GLUCAP in the last 168 hours. Lipid Profile: No results for input(s): CHOL, HDL, LDLCALC, TRIG, CHOLHDL, LDLDIRECT in the last 72 hours. Thyroid Function Tests: No results for input(s): TSH, T4TOTAL, FREET4, T3FREE, THYROIDAB in the last 72 hours. Anemia Panel: No results for input(s): VITAMINB12, FOLATE, FERRITIN, TIBC, IRON, RETICCTPCT in the last 72 hours. Urine analysis:    Component Value Date/Time   COLORURINE YELLOW 10/17/2019 2120   APPEARANCEUR CLEAR 10/17/2019 2120   LABSPEC 1.028 10/17/2019 2120   PHURINE  5.0 10/17/2019 2120   GLUCOSEU NEGATIVE 10/17/2019 2120   HGBUR NEGATIVE 10/17/2019 2120   BILIRUBINUR NEGATIVE 10/17/2019 2120   KETONESUR 80 (A) 10/17/2019 2120   PROTEINUR 100 (A) 10/17/2019 2120   UROBILINOGEN 0.2 11/21/2014 1504   NITRITE NEGATIVE 10/17/2019 2120   LEUKOCYTESUR NEGATIVE 10/17/2019 2120   Sepsis Labs: @LABRCNTIP (procalcitonin:4,lacticidven:4) )No results found for this or any previous visit (from the past 240 hour(s)).   Radiological Exams on Admission: Dg Chest 2 View  Result Date: 10/17/2019 CLINICAL DATA:  Leukocytosis, disorientation, altered mental status, history hypertension, diabetes mellitus, breast cancer EXAM: CHEST - 2 VIEW COMPARISON:  None FINDINGS: Enlargement of cardiac silhouette. Mediastinal contours and pulmonary vascularity normal. Central peribronchial thickening. No pulmonary infiltrate, pleural effusion, or pneumothorax. Scattered degenerative disc disease changes thoracic spine. IMPRESSION: Enlargement of cardiac silhouette. Mild bronchitic changes without infiltrate. Electronically Signed   By: Lavonia Dana  M.D.   On: 10/17/2019 17:44   Ct Head Wo Contrast  Result Date: 10/17/2019 CLINICAL DATA:  Mental status changes. EXAM: CT HEAD WITHOUT CONTRAST TECHNIQUE: Contiguous axial images were obtained from the base of the skull through the vertex without intravenous contrast. COMPARISON:  11/21/2014 FINDINGS: Brain: The ventricles are normal in size and configuration. No extra-axial fluid collections are identified. The gray-white differentiation is maintained. No CT findings for acute hemispheric infarction or intracranial hemorrhage. No mass lesions. The brainstem and cerebellum are normal. Vascular: No hyperdense vessels or obvious aneurysm. Skull: No acute skull fracture.  No bone lesion. Sinuses/Orbits: The paranasal sinuses and mastoid air cells are clear. The globes are intact. Other: No scalp lesions, laceration or hematoma. IMPRESSION: No acute  intracranial findings. Electronically Signed   By: Marijo Sanes M.D.   On: 10/17/2019 17:13   Mr Brain Wo Contrast  Result Date: 10/17/2019 CLINICAL DATA:  63 year old female with altered mental status. EXAM: MRI HEAD WITHOUT CONTRAST TECHNIQUE: Multiplanar, multiecho pulse sequences of the brain and surrounding structures were obtained without intravenous contrast. COMPARISON:  Head CT earlier tonight. Report of [facility] brain MRI 11/18/2011 (no images available). FINDINGS: Brain: Cerebral volume is within normal limits for age. No restricted diffusion to suggest acute infarction. No midline shift, mass effect, evidence of mass lesion, ventriculomegaly, extra-axial collection or acute intracranial hemorrhage. Cervicomedullary junction and pituitary are within normal limits. Scattered mild - or at most moderate for age - cerebral white matter T2 and FLAIR hyperintensity in a nonspecific configuration, most pronounced in the left corona radiata on series 6, image 15. No superimposed cortical encephalomalacia or chronic cerebral blood products. Negative deep gray nuclei, brainstem and cerebellum. Vascular: Major intracranial vascular flow voids are preserved. Skull and upper cervical spine: Upper cervical spine obscured by motion on sagittal T1 imaging. Visualized bone marrow signal is within normal limits. Sinuses/Orbits: Negative orbits. Paranasal sinuses are clear. Other: Visible internal auditory structures appear normal. Mastoids are clear. Scalp and face soft tissues appear negative. IMPRESSION: 1. No acute intracranial abnormality. 2. Mild for age nonspecific cerebral white matter signal changes, most commonly due to chronic small vessel disease. Electronically Signed   By: Genevie Ann M.D.   On: 10/17/2019 22:23    EKG: Independently reviewed. Sinus tachycardia (rate 101).   Assessment/Plan   1. Acute encephalopathy  - Presents with 3-4 days of waxing and  waning confusion, not caring for self - No acute findings noted on head CT or MRI brain  - Basic labs notable for mild renal insufficiency and leukocytosis to 17,600  - Neurology is consulting and much appreciated  - DDx is broad, includes HSV encephalitis, LP attempt was unsuccessful in ED, and she has been started on empiric acyclovir   - Check B12, thiamine, TSH, ammonia, RPR, UDS, and routine EEG  - Consult IR for LP with HSV PCR, cell counts, protein, glucose, gram stain, and culture  2. Type II DM  - A1c was 6.5% in August 2020  - Managed at home with glipizide, held on admission  - Check CBG's and use SSI with Novolog as needed while in hospital   3. Mild renal insufficiency  - SCr is 1.14 on admission, up from 0.85 in August 2020  - Likely reflects dehydration  - Continue IVF hydration, hold HCTZ and ARB, repeat chem panel in am   4. Hypertension  - BP at goal, use labetalol IVP's as needed for now  PPE: mask, face shield  DVT prophylaxis: SCD's  Code Status: Full  Family Communication: Discussed with patient  Consults called: Neurology consulted by ED physician  Admission status: Observation     Vianne Bulls, MD [facility] Pager [phone]  If 7PM-7AM, please contact night-coverage [email or web address] Password [facility]  10/18/2019, 12:47 AM

## 2019-10-18 NOTE — Progress Notes (Signed)
[facility] PROGRESS NOTE  Monica Bauer [id number] DOB: 12-21-1955 DOA: 10/17/2019 PCP: Thornton Dales I, MD  Assessment/Plan: 1. Acute encephalopathy  Presented 11/19 with 3-4 days of waxing and waning confusion, not caring for self or her 63 yo mother with whom she resides. No acute findings noted on head CT or MRI brain. Basic labs notable for mild renal insufficiency and leukocytosis to 17,600 and elevated B12 level and CK 680. CO2 21 and gap 17.  Urinalysis with protien and keytones.  Neurology evaluated and opined broad differential and recommended LP. LP attempt was unsuccessful in ED, and she has been started on empiric acyclovir  -follow B1 and RPR and UDS.  - IR for LP with HSV PCR, cell counts, protein, glucose, gram stain, and culture -continue empiric acyclovie  2. Type II DM  - A1c was 6.7%  Yesterday. Serum glucose 94. Home meds include glipizide.  -holding oral agents -SSI for optimal control   3. Mild renal insufficiency  - SCr is 1.14 on admission and 1.07 this am.  Likely reflects dehydration. Of note care everywhere PCP note indicates HCTZ stopped 8/20. She reports she still takes it but information from her unreliable due to #1.  - Continue IVF hydration, -  hold HCTZ and ARB  -monitor  4. Hypertension BP elevated this am. Likely related to no agents being given due to npo status and no prn labetolol given either - monitor closely - use labetalol IVP's as needed for now     Code Status: full Family Communication: called son and no answer, no opportunity to leave message Disposition Plan: to be determined, likely home   Consultants:  [facility]  Procedures:  LP  Antibiotics:  Acyclovir 11/19>>  HPI/Subjective: Sitting up in bed requesting assistance for bathroom. Denies pain/discomfort. alert  Objective: Vitals:   10/18/19 0800 10/18/19 1053  BP: (!) 133/110 120/80  Pulse: 88 76  Resp: 16 16  Temp:    SpO2: 95% 100%    No intake or output data in the 24 hours ending 10/18/19 1109 Filed Weights   10/17/19 1655  Weight: 90.3 kg    Exam:   General:  Alert oriented to self and place no acute distress  Cardiovascular: rrr no mgr no LE edema  Respiratory: normal effort BS clear bilaterally no wheeze  Abdomen: non-distended non-tender +BS   Musculoskeletal: joints without swelling/erythema, moves all extremities  Neuro: alert oriented to self and place, speech clear facial symmetry, bilateral grip 5/5 LE strenght 5/5 bilaterally   Data Reviewed: Basic Metabolic Panel: Recent Labs  Lab 10/17/19 1657 10/18/19 0404  NA 139 144  K 3.7 3.4*  CL 107 106  CO2 21* 21*  GLUCOSE 136* 94  BUN 25* 21  CREATININE 1.14* 1.07*  CALCIUM 9.8 9.5   Liver Function Tests: Recent Labs  Lab 10/17/19 1657  AST 40  ALT 31  ALKPHOS 56  BILITOT 1.2  PROT 8.7*  ALBUMIN 4.5   No results for input(s): LIPASE, AMYLASE in the last 168 hours. Recent Labs  Lab 10/18/19 0116  AMMONIA 39*   CBC: Recent Labs  Lab 10/17/19 1657 10/18/19 0404  WBC 17.6* 13.2*  NEUTROABS 12.5* 7.7  HGB 12.7 12.4  HCT 38.6 41.1  MCV 85.8 94.5  PLT 299 245   Cardiac Enzymes: Recent Labs  Lab 10/17/19 1657 10/18/19 0404  CKTOTAL 808* 680*   BNP (last 3 results) No results for input(s): BNP in the last 8760 hours.  ProBNP (last 3  results) No results for input(s): PROBNP in the last 8760 hours.  CBG: Recent Labs  Lab 10/18/19 0201 10/18/19 0520 10/18/19 1028  GLUCAP 82 92 96    Recent Results (from the past 240 hour(s))  SARS CORONAVIRUS 2 (TAT 6-24 HRS) Nasopharyngeal Nasopharyngeal Swab     Status: None   Collection Time: 10/17/19  6:35 PM   Specimen: Nasopharyngeal Swab  Result Value Ref Range Status   SARS Coronavirus 2 NEGATIVE NEGATIVE Final    Comment: (NOTE) SARS-CoV-2 target nucleic acids are NOT DETECTED. The SARS-CoV-2 RNA is generally detectable in upper and lower respiratory specimens  during the acute phase of infection. Negative results do not preclude SARS-CoV-2 infection, do not rule out co-infections with other pathogens, and should not be used as the sole basis for treatment or other patient management decisions. Negative results must be combined with clinical observations, patient history, and epidemiological information. The expected result is Negative. Fact Sheet for Patients: SugarRoll.be Fact Sheet for Healthcare Providers: https://www.woods-mathews.com/ This test is not yet approved or cleared by the [place] FDA and  has been authorized for detection and/or diagnosis of SARS-CoV-2 by FDA under an Emergency Use Authorization (EUA). This EUA will remain  in effect (meaning this test can be used) for the duration of the COVID-19 declaration under Section 56 4(b)(1) of the Act, 21 U.S.C. section 360bbb-3(b)(1), unless the authorization is terminated or revoked sooner. Performed at [facility], [place] [address], [place], [place] [address]      Studies: Dg Chest 2 View  Result Date: 10/17/2019 CLINICAL DATA:  Leukocytosis, disorientation, altered mental status, history hypertension, diabetes mellitus, breast cancer EXAM: CHEST - 2 VIEW COMPARISON:  None FINDINGS: Enlargement of cardiac silhouette. Mediastinal contours and pulmonary vascularity normal. Central peribronchial thickening. No pulmonary infiltrate, pleural effusion, or pneumothorax. Scattered degenerative disc disease changes thoracic spine. IMPRESSION: Enlargement of cardiac silhouette. Mild bronchitic changes without infiltrate. Electronically Signed   By: Lavonia Dana M.D.   On: 10/17/2019 17:44   Ct Head Wo Contrast  Result Date: 10/17/2019 CLINICAL DATA:  Mental status changes. EXAM: CT HEAD WITHOUT CONTRAST TECHNIQUE: Contiguous axial images were obtained from the base of the skull through the vertex without intravenous contrast. COMPARISON:   11/21/2014 FINDINGS: Brain: The ventricles are normal in size and configuration. No extra-axial fluid collections are identified. The gray-white differentiation is maintained. No CT findings for acute hemispheric infarction or intracranial hemorrhage. No mass lesions. The brainstem and cerebellum are normal. Vascular: No hyperdense vessels or obvious aneurysm. Skull: No acute skull fracture.  No bone lesion. Sinuses/Orbits: The paranasal sinuses and mastoid air cells are clear. The globes are intact. Other: No scalp lesions, laceration or hematoma. IMPRESSION: No acute intracranial findings. Electronically Signed   By: Marijo Sanes M.D.   On: 10/17/2019 17:13   Mr Brain Wo Contrast  Result Date: 10/17/2019 CLINICAL DATA:  63 year old female with altered mental status. EXAM: MRI HEAD WITHOUT CONTRAST TECHNIQUE: Multiplanar, multiecho pulse sequences of the brain and surrounding structures were obtained without intravenous contrast. COMPARISON:  Head CT earlier tonight. Report of [facility] brain MRI 11/18/2011 (no images available). FINDINGS: Brain: Cerebral volume is within normal limits for age. No restricted diffusion to suggest acute infarction. No midline shift, mass effect, evidence of mass lesion, ventriculomegaly, extra-axial collection or acute intracranial hemorrhage. Cervicomedullary junction and pituitary are within normal limits. Scattered mild - or at most moderate for age - cerebral white matter T2  and FLAIR hyperintensity in a nonspecific configuration, most pronounced in the left corona radiata on series 6, image 15. No superimposed cortical encephalomalacia or chronic cerebral blood products. Negative deep gray nuclei, brainstem and cerebellum. Vascular: Major intracranial vascular flow voids are preserved. Skull and upper cervical spine: Upper cervical spine obscured by motion on sagittal T1 imaging. Visualized bone marrow signal is within normal  limits. Sinuses/Orbits: Negative orbits. Paranasal sinuses are clear. Other: Visible internal auditory structures appear normal. Mastoids are clear. Scalp and face soft tissues appear negative. IMPRESSION: 1. No acute intracranial abnormality. 2. Mild for age nonspecific cerebral white matter signal changes, most commonly due to chronic small vessel disease. Electronically Signed   By: Genevie Ann M.D.   On: 10/17/2019 22:23    Scheduled Meds: . insulin aspart  0-9 Units Subcutaneous Q4H  . sodium chloride flush  3 mL Intravenous Q12H   Continuous Infusions: . acyclovir      Principal Problem:   Acute encephalopathy Active Problems:   Hypertension   Mild renal insufficiency   Elevated vitamin B12 level   Diabetes mellitus without complication ([place])   Breast cancer ([place])    Time spent: 36 minutes    [place] NP  [facility]  If 7PM-7AM, please contact night-coverage at [email or web address], password [facility] 10/18/2019, 11:09 AM  LOS: 0 days

## 2019-10-19 LAB — GLUCOSE, CAPILLARY
Glucose-Capillary: 126 mg/dL — ABNORMAL HIGH (ref 70–99)
Glucose-Capillary: 219 mg/dL — ABNORMAL HIGH (ref 70–99)
Glucose-Capillary: 96 mg/dL (ref 70–99)
Glucose-Capillary: 131 mg/dL — ABNORMAL HIGH (ref 70–99)
Glucose-Capillary: 136 mg/dL — ABNORMAL HIGH (ref 70–99)

## 2019-10-19 LAB — CBC
HCT: 39.5 % (ref 36.0–46.0)
Hemoglobin: 13.3 g/dL (ref 12.0–15.0)
MCH: 28.6 pg (ref 26.0–34.0)
MCHC: 33.7 g/dL (ref 30.0–36.0)
MCV: 84.9 fL (ref 80.0–100.0)
Platelets: 272 10*3/uL (ref 150–400)
RBC: 4.65 MIL/uL (ref 3.87–5.11)
RDW: 14.6 % (ref 11.5–15.5)
WBC: 14.2 10*3/uL — ABNORMAL HIGH (ref 4.0–10.5)
nRBC: 0 % (ref 0.0–0.2)

## 2019-10-19 LAB — BASIC METABOLIC PANEL
Anion gap: 16 — ABNORMAL HIGH (ref 5–15)
BUN: 17 mg/dL (ref 8–23)
CO2: 22 mmol/L (ref 22–32)
Calcium: 9.8 mg/dL (ref 8.9–10.3)
Chloride: 107 mmol/L (ref 98–111)
Creatinine, Ser: 1.6 mg/dL — ABNORMAL HIGH (ref 0.44–1.00)
GFR calc Af Amer: 39 mL/min — ABNORMAL LOW (ref >60)
GFR calc non Af Amer: 34 mL/min — ABNORMAL LOW (ref >60)
Glucose, Bld: 130 mg/dL — ABNORMAL HIGH (ref 70–99)
Potassium: 3.5 mmol/L (ref 3.5–5.1)
Sodium: 145 mmol/L (ref 135–145)

## 2019-10-19 MED ORDER — INSULIN ASPART 100 UNIT/ML ~~LOC~~ SOLN
0.0000 [IU] | Freq: Every day | SUBCUTANEOUS | Status: DC
  Administered 2019-10-21: 5 [IU] via SUBCUTANEOUS

## 2019-10-19 MED ORDER — ENOXAPARIN SODIUM 40 MG/0.4ML ~~LOC~~ SOLN
40.0000 mg | SUBCUTANEOUS | Status: DC
  Administered 2019-10-19 – 2019-10-28 (×10): 40 mg via SUBCUTANEOUS
  Filled 2019-10-19 (×10): qty 0.4

## 2019-10-19 MED ORDER — INSULIN ASPART 100 UNIT/ML ~~LOC~~ SOLN
0.0000 [IU] | Freq: Three times a day (TID) | SUBCUTANEOUS | Status: DC
  Administered 2019-10-19: 2 [IU] via SUBCUTANEOUS
  Administered 2019-10-20: 3 [IU] via SUBCUTANEOUS
  Administered 2019-10-20 – 2019-10-21 (×2): 2 [IU] via SUBCUTANEOUS
  Administered 2019-10-22: 3 [IU] via SUBCUTANEOUS
  Administered 2019-10-23: 2 [IU] via SUBCUTANEOUS
  Administered 2019-10-24 – 2019-10-26 (×2): 3 [IU] via SUBCUTANEOUS
  Administered 2019-10-27 – 2019-10-28 (×2): 2 [IU] via SUBCUTANEOUS

## 2019-10-19 MED ORDER — DEXTROSE 5 % IV SOLN
10.0000 mg/kg | Freq: Two times a day (BID) | INTRAVENOUS | Status: DC
  Administered 2019-10-19 – 2019-10-22 (×6): 660 mg via INTRAVENOUS
  Filled 2019-10-19 (×7): qty 13.2

## 2019-10-19 MED ORDER — SODIUM CHLORIDE 0.9 % IV SOLN
INTRAVENOUS | Status: DC
  Administered 2019-10-19 – 2019-10-20 (×2): via INTRAVENOUS

## 2019-10-19 NOTE — Progress Notes (Signed)
Progress Note    Athziry Millican  FEX:[id number] DOB: 04/25/1956  DOA: 10/17/2019 PCP: Thornton Dales I, MD    Brief Narrative:     Medical records reviewed and are as summarized below:  Mirissa Lopresti is an 63 y.o. female with medical history significant for hypertension, breast cancer, and type 2 diabetes mellitus, presenting to the emergency department for evaluation of confusion.  Patient was brought in to the ED by her son who has been concerned for waxing and waning confusion over the past 3 to 4 days.  Patient seemed to be in her usual state of health until she seemed to be slightly confused on 10/13/2019.  She has seemed to be lucid at times, but overall seems to be worsening, not caring for herself, and calling family in the middle of the night with marked confusion and garbled speech.  Patient is normally alert and fully oriented, manages all of her ADLs, and also cares for her elderly mother.    Assessment/Plan:   Principal Problem:   Acute encephalopathy Active Problems:   Hypertension   Diabetes mellitus without complication (HCC)   Mild renal insufficiency   Breast cancer (HCC)   Elevated vitamin B12 level   AMS (altered mental status)    Acute encephalopathy -Presented 11/19 with 3-4 days of waxing and waning confusion, not caring for self or her 31 yo mother with whom she resides -No acute findings noted on head CT or MRI brain. -Neurology evaluated: broad differential diagnosis-- seizures vs infection vs worsening dementia-- LP unsuccessful at bedside and with IR:  empiric acyclovir -B1 pending -RPR and UDS- negative   Elevated B12 -? Etiology -does not have B12 on home meds so not convinced of over-replacement  H/o breast cancer 2013 -. Malignant neoplasm of upper-outer quadrant of right female breast, unspecified estrogen receptor status (CMS-HCC)  DIAGNOSIS: Right LQ 10/2012 Stage 1 T1 c No,breast cancer invasive, HER-2 negative, ER positive  95%,PR 3-5 % node negative. Recurrence score 2, recurrence rate 4%. received radiation and arimidex 01/2013  Continue arimidex per last progress note: 11/2018 by oncology- [facility]  Type II DM -A1c 6.7%   -holding oral agents -SSI for optimal control -carb mod diet  AKI -IVF hydration, - bladder scan -strict I/Os  Hypertension -home meds have still not been reconciled by pharmacy  obesity Body mass index is 36.4 kg/m.   Family Communication/Anticipated D/C date and plan/Code Status   DVT prophylaxis: Lovenox ordered. Code Status: Full Code.  Family Communication:  Disposition Plan: pending improvement/treatment with IV acyclovir   Medical Consultants:    Neurology  Subjective:   "did I miss Thanksgiving?"  Objective:    Vitals:   10/18/19 1400 10/18/19 2121 10/19/19 0508 10/19/19 0821  BP: 131/84 139/76 (!) 151/90 (!) 148/77  Pulse: 62 89 92 83  Resp: '18 16 16 16  ' Temp: 99.1 F (37.3 C) 98.7 F (37.1 C) 99 F (37.2 C) 98.7 F (37.1 C)  TempSrc: Oral Oral Oral Oral  SpO2: 100% 96% 98% 97%  Weight:      Height:        Intake/Output Summary (Last 24 hours) at 10/19/2019 1018 Last data filed at 10/19/2019 0400 Gross per 24 hour  Intake 960 ml  Output --  Net 960 ml   Filed Weights   10/17/19 1655  Weight: 90.3 kg    Exam: In bed, dry mucous membranes rrr No increased work of breathing -oriented to month, place and time -pleasant and cooperative  Data Reviewed:   I have personally reviewed following labs and imaging studies:  Labs: Labs show the following:   Basic Metabolic Panel: Recent Labs  Lab 10/17/19 1657 10/18/19 0404 10/19/19 0437  NA 139 144 145  K 3.7 3.4* 3.5  CL 107 106 107  CO2 21* 21* 22  GLUCOSE 136* 94 130*  BUN 25* 21 17  CREATININE 1.14* 1.07* 1.60*  CALCIUM 9.8 9.5 9.8   GFR Estimated Creatinine Clearance: 37.6 mL/min (A) (by C-G formula based on SCr of 1.6 mg/dL (H)). Liver Function Tests: Recent  Labs  Lab 10/17/19 1657  AST 40  ALT 31  ALKPHOS 56  BILITOT 1.2  PROT 8.7*  ALBUMIN 4.5   No results for input(s): LIPASE, AMYLASE in the last 168 hours. Recent Labs  Lab 10/18/19 0116  AMMONIA 39*   Coagulation profile No results for input(s): INR, PROTIME in the last 168 hours.  CBC: Recent Labs  Lab 10/17/19 1657 10/18/19 0404 10/19/19 0437  WBC 17.6* 13.2* 14.2*  NEUTROABS 12.5* 7.7  --   HGB 12.7 12.4 13.3  HCT 38.6 41.1 39.5  MCV 85.8 94.5 84.9  PLT 299 245 272   Cardiac Enzymes: Recent Labs  Lab 10/17/19 1657 10/18/19 0404  CKTOTAL 808* 680*   BNP (last 3 results) No results for input(s): PROBNP in the last 8760 hours. CBG: Recent Labs  Lab 10/18/19 1355 10/18/19 1721 10/18/19 2032 10/19/19 0400 10/19/19 0742  GLUCAP 84 77 94 126* 219*   D-Dimer: No results for input(s): DDIMER in the last 72 hours. Hgb A1c: Recent Labs    10/18/19 0113  HGBA1C 6.7*   Lipid Profile: No results for input(s): CHOL, HDL, LDLCALC, TRIG, CHOLHDL, LDLDIRECT in the last 72 hours. Thyroid function studies: Recent Labs    10/18/19 0113  TSH 0.964   Anemia work up: Recent Labs    10/18/19 0113  VITAMINB12 5,711*   Sepsis Labs: Recent Labs  Lab 10/17/19 1657 10/18/19 0404 10/19/19 0437  WBC 17.6* 13.2* 14.2*    Microbiology Recent Results (from the past 240 hour(s))  SARS CORONAVIRUS 2 (TAT 6-24 HRS) Nasopharyngeal Nasopharyngeal Swab     Status: None   Collection Time: 10/17/19  6:35 PM   Specimen: Nasopharyngeal Swab  Result Value Ref Range Status   SARS Coronavirus 2 NEGATIVE NEGATIVE Final    Comment: (NOTE) SARS-CoV-2 target nucleic acids are NOT DETECTED. The SARS-CoV-2 RNA is generally detectable in upper and lower respiratory specimens during the acute phase of infection. Negative results do not preclude SARS-CoV-2 infection, do not rule out co-infections with other pathogens, and should not be used as the sole basis for treatment or  other patient management decisions. Negative results must be combined with clinical observations, patient history, and epidemiological information. The expected result is Negative. Fact Sheet for Patients: SugarRoll.be Fact Sheet for Healthcare Providers: https://www.woods-mathews.com/ This test is not yet approved or cleared by the [place] FDA and  has been authorized for detection and/or diagnosis of SARS-CoV-2 by FDA under an Emergency Use Authorization (EUA). This EUA will remain  in effect (meaning this test can be used) for the duration of the COVID-19 declaration under Section 56 4(b)(1) of the Act, 21 U.S.C. section 360bbb-3(b)(1), unless the authorization is terminated or revoked sooner. Performed at [facility], [place] [address] [place], [place] [address]     Procedures and diagnostic studies:  Ct Angio Head W Or Wo Contrast  Result Date: 10/18/2019 CLINICAL DATA:  Focal  neuro deficit, greater than 6 hours, stroke suspected. Additional history provided: 63 year old female with altered mental status. EEG to evaluate for seizure and acute and cephalopathy. EXAM: CT ANGIOGRAPHY HEAD AND NECK TECHNIQUE: Multidetector CT imaging of the head and neck was performed using the standard protocol during bolus administration of intravenous contrast. Multiplanar CT image reconstructions and MIPs were obtained to evaluate the vascular anatomy. Carotid stenosis measurements (when applicable) are obtained utilizing NASCET criteria, using the distal internal carotid diameter as the denominator. CONTRAST:  104m OMNIPAQUE IOHEXOL 350 MG/ML SOLN COMPARISON:  Brain MRI 10/17/2019, head CT 10/17/2011 FINDINGS: CTA NECK FINDINGS Aortic arch: Standard aortic branching. Right carotid system: CCA and ICA widely patent within the neck without stenosis Left carotid system: CCA and ICA widely patent within the neck without stenosis. Mild mixed plaque at the  carotid bifurcation. Vertebral arteries: Codominant. Vertebral arteries patent within the neck without significant stenosis (50% or greater). Skeleton: No acute bony abnormality. Cervical spondylosis. Most notably, there is severe disc degeneration at C3-C4 and C5-C6. Shallow posterior disc osteophytes at these levels. No high-grade bony spinal canal stenosis. Mild C4-C5 degenerative grade 1 anterolisthesis. Other neck: No neck mass or pathologically enlarged cervical chain lymph nodes. Upper chest: The imaged lung apices are clear. Prominence of the visualized main pulmonary artery, measuring 3.6 cm in diameter, suggestive of pulmonary hypertension. Review of the MIP images confirms the above findings CTA HEAD FINDINGS Anterior circulation: The intracranial internal carotid arteries are patent. Mild calcified plaque within the paraclinoid right ICA without significant stenosis. The bilateral middle and anterior cerebral arteries are patent without significant proximal stenosis. No intracranial aneurysm is identified. Posterior circulation: The intracranial vertebral arteries are patent without significant stenosis, as is the basilar artery. Predominantly fetal origin of the bilateral posterior cerebral arteries. The bilateral posterior cerebral arteries are patent without significant proximal stenosis. Venous sinuses: Within limitations of contrast timing, no convincing thrombus. Dominant right transverse and sigmoid dural venous sinuses. Anatomic variants: As described. Review of the MIP images confirms the above findings IMPRESSION: CTA neck: 1. Common carotid, internal carotid and vertebral arteries patent within the neck without significant stenosis. 2. Prominence of the visualized main pulmonary artery, suggestive of pulmonary hypertension. CTA head: 1. No intracranial large vessel occlusion or proximal high-grade arterial stenosis. 2. Mild calcified plaque within the paraclinoid right ICA. Electronically  Signed   By: KKellie SimmeringDO   On: 10/18/2019 12:18   Dg Chest 2 View  Result Date: 10/17/2019 CLINICAL DATA:  Leukocytosis, disorientation, altered mental status, history hypertension, diabetes mellitus, breast cancer EXAM: CHEST - 2 VIEW COMPARISON:  None FINDINGS: Enlargement of cardiac silhouette. Mediastinal contours and pulmonary vascularity normal. Central peribronchial thickening. No pulmonary infiltrate, pleural effusion, or pneumothorax. Scattered degenerative disc disease changes thoracic spine. IMPRESSION: Enlargement of cardiac silhouette. Mild bronchitic changes without infiltrate. Electronically Signed   By: MLavonia DanaM.D.   On: 10/17/2019 17:44   Ct Head Wo Contrast  Result Date: 10/17/2019 CLINICAL DATA:  Mental status changes. EXAM: CT HEAD WITHOUT CONTRAST TECHNIQUE: Contiguous axial images were obtained from the base of the skull through the vertex without intravenous contrast. COMPARISON:  11/21/2014 FINDINGS: Brain: The ventricles are normal in size and configuration. No extra-axial fluid collections are identified. The gray-white differentiation is maintained. No CT findings for acute hemispheric infarction or intracranial hemorrhage. No mass lesions. The brainstem and cerebellum are normal. Vascular: No hyperdense vessels or obvious aneurysm. Skull: No acute skull fracture.  No bone lesion. Sinuses/Orbits: The  paranasal sinuses and mastoid air cells are clear. The globes are intact. Other: No scalp lesions, laceration or hematoma. IMPRESSION: No acute intracranial findings. Electronically Signed   By: Marijo Sanes M.D.   On: 10/17/2019 17:13   Ct Angio Neck W Or Wo Contrast  Result Date: 10/18/2019 CLINICAL DATA:  Focal neuro deficit, greater than 6 hours, stroke suspected. Additional history provided: 63 year old female with altered mental status. EEG to evaluate for seizure and acute and cephalopathy. EXAM: CT ANGIOGRAPHY HEAD AND NECK TECHNIQUE: Multidetector CT imaging  of the head and neck was performed using the standard protocol during bolus administration of intravenous contrast. Multiplanar CT image reconstructions and MIPs were obtained to evaluate the vascular anatomy. Carotid stenosis measurements (when applicable) are obtained utilizing NASCET criteria, using the distal internal carotid diameter as the denominator. CONTRAST:  58m OMNIPAQUE IOHEXOL 350 MG/ML SOLN COMPARISON:  Brain MRI 10/17/2019, head CT 10/17/2011 FINDINGS: CTA NECK FINDINGS Aortic arch: Standard aortic branching. Right carotid system: CCA and ICA widely patent within the neck without stenosis Left carotid system: CCA and ICA widely patent within the neck without stenosis. Mild mixed plaque at the carotid bifurcation. Vertebral arteries: Codominant. Vertebral arteries patent within the neck without significant stenosis (50% or greater). Skeleton: No acute bony abnormality. Cervical spondylosis. Most notably, there is severe disc degeneration at C3-C4 and C5-C6. Shallow posterior disc osteophytes at these levels. No high-grade bony spinal canal stenosis. Mild C4-C5 degenerative grade 1 anterolisthesis. Other neck: No neck mass or pathologically enlarged cervical chain lymph nodes. Upper chest: The imaged lung apices are clear. Prominence of the visualized main pulmonary artery, measuring 3.6 cm in diameter, suggestive of pulmonary hypertension. Review of the MIP images confirms the above findings CTA HEAD FINDINGS Anterior circulation: The intracranial internal carotid arteries are patent. Mild calcified plaque within the paraclinoid right ICA without significant stenosis. The bilateral middle and anterior cerebral arteries are patent without significant proximal stenosis. No intracranial aneurysm is identified. Posterior circulation: The intracranial vertebral arteries are patent without significant stenosis, as is the basilar artery. Predominantly fetal origin of the bilateral posterior cerebral  arteries. The bilateral posterior cerebral arteries are patent without significant proximal stenosis. Venous sinuses: Within limitations of contrast timing, no convincing thrombus. Dominant right transverse and sigmoid dural venous sinuses. Anatomic variants: As described. Review of the MIP images confirms the above findings IMPRESSION: CTA neck: 1. Common carotid, internal carotid and vertebral arteries patent within the neck without significant stenosis. 2. Prominence of the visualized main pulmonary artery, suggestive of pulmonary hypertension. CTA head: 1. No intracranial large vessel occlusion or proximal high-grade arterial stenosis. 2. Mild calcified plaque within the paraclinoid right ICA. Electronically Signed   By: KKellie SimmeringDO   On: 10/18/2019 12:18   Mr Brain Wo Contrast  Result Date: 10/17/2019 CLINICAL DATA:  63year old female with altered mental status. EXAM: MRI HEAD WITHOUT CONTRAST TECHNIQUE: Multiplanar, multiecho pulse sequences of the brain and surrounding structures were obtained without intravenous contrast. COMPARISON:  Head CT earlier tonight. [facility] (no images available). FINDINGS: Brain: Cerebral volume is within normal limits for age. No restricted diffusion to suggest acute infarction. No midline shift, mass effect, evidence of mass lesion, ventriculomegaly, extra-axial collection or acute intracranial hemorrhage. Cervicomedullary junction and pituitary are within normal limits. Scattered mild - or at most moderate for age - cerebral white matter T2 and FLAIR hyperintensity in a nonspecific configuration, most pronounced in the left  corona radiata on series 6, image 15. No superimposed cortical encephalomalacia or chronic cerebral blood products. Negative deep gray nuclei, brainstem and cerebellum. Vascular: Major intracranial vascular flow voids are preserved. Skull and upper cervical spine: Upper  cervical spine obscured by motion on sagittal T1 imaging. Visualized bone marrow signal is within normal limits. Sinuses/Orbits: Negative orbits. Paranasal sinuses are clear. Other: Visible internal auditory structures appear normal. Mastoids are clear. Scalp and face soft tissues appear negative. IMPRESSION: 1. No acute intracranial abnormality. 2. Mild for age nonspecific cerebral white matter signal changes, most commonly due to chronic small vessel disease. Electronically Signed   By: Genevie Ann M.D.   On: 10/17/2019 22:23   Dg Fluoro Rm 1-60 Min  Result Date: 10/18/2019 CLINICAL DATA:  Encephalopathy with leukocytosis EXAM: DIAGNOSTIC LUMBAR PUNCTURE UNDER FLUOROSCOPIC GUIDANCE FLUOROSCOPY TIME:  Fluoroscopy Time:  1 minutes 12 seconds Radiation Exposure Index (if provided by the fluoroscopic device): 14.5 mGy Number of Acquired Spot Images: 1 PROCEDURE: Informed consent was obtained from the patient prior to the procedure, including potential complications of headache, allergy, and pain. With the patient prone, the lower back was prepped with Betadine. 1% Lidocaine was used for local anesthesia. Lumbar puncture was attempted at the L4-5 and L5-S1 levels using a 20 gauge needle. Patient complained of significant pain/discomfort, particularly with subcutaneous lidocaine administration. Unfortunately, despite multiple attempts at two different levels, the procedure was unsuccessful. The patient tolerated the attempted procedure well and there were no apparent complications. IMPRESSION: Unsuccessful fluoroscopic guided lumbar puncture attempted at L4-5 and L5-S1. Electronically Signed   By: Julian Hy M.D.   On: 10/18/2019 14:27    Medications:    insulin aspart  0-15 Units Subcutaneous TID WC   insulin aspart  0-5 Units Subcutaneous QHS   sodium chloride flush  3 mL Intravenous Q12H   Continuous Infusions:  acyclovir 660 mg (10/19/19 0855)     LOS: 1 day   Geradine Girt  [facility]   How to contact the [facility] Attending or Consulting provider [place] or covering provider during after hours [place], for this patient?  1. Check the care team in [facility] and look for a) attending/consulting [facility] provider listed and b) the [facility] team listed 2. Log into [email or web address] and use [place]'s universal password to access. If you do not have the password, please contact the hospital operator. 3. Locate the [facility] provider you are looking for under [facility] and page to a number that you can be directly reached. 4. If you still have difficulty reaching the provider, please page the [facility] (Director on Call) for the Hospitalists listed on amion for assistance.  10/19/2019, 10:18 AM

## 2019-10-19 NOTE — Progress Notes (Signed)
PHARMACY NOTE:  ANTIMICROBIAL RENAL DOSAGE ADJUSTMENT  Current antimicrobial regimen includes a mismatch between antimicrobial dosage and estimated renal function.  As per policy approved by the Pharmacy & Therapeutics and Medical Executive Committees, the antimicrobial dosage will be adjusted accordingly.  Current antimicrobial dosage: Acyclovir 660 mg (10 mg/AdjBw kg) Q8H  Indication: Acyclovir for rule out of HSV encephalitis  Renal Function: Creatinine 1.60 (Up from 1.07) Estimated Creatinine Clearance: 37.6 mL/min (A) (by C-G formula based on SCr of 1.6 mg/dL (H)).    Antimicrobial dosage has been changed to:  Acyclovir 660 mg (10 mg/AdjBw kg) Q12H  Thank you for allowing pharmacy to be a part of this patient's care.  Acey Lav, PharmD  PGY1 Acute Care Pharmacy Resident [phone] 10/19/2019 10:23 AM

## 2019-10-20 ENCOUNTER — Inpatient Hospital Stay (HOSPITAL_COMMUNITY): Payer: Medicare Other

## 2019-10-20 LAB — BASIC METABOLIC PANEL
Anion gap: 11 (ref 5–15)
BUN: 16 mg/dL (ref 8–23)
CO2: 24 mmol/L (ref 22–32)
Calcium: 9.2 mg/dL (ref 8.9–10.3)
Chloride: 107 mmol/L (ref 98–111)
Creatinine, Ser: 1.36 mg/dL — ABNORMAL HIGH (ref 0.44–1.00)
GFR calc Af Amer: 48 mL/min — ABNORMAL LOW (ref >60)
GFR calc non Af Amer: 41 mL/min — ABNORMAL LOW (ref >60)
Glucose, Bld: 186 mg/dL — ABNORMAL HIGH (ref 70–99)
Potassium: 3.2 mmol/L — ABNORMAL LOW (ref 3.5–5.1)
Sodium: 142 mmol/L (ref 135–145)

## 2019-10-20 LAB — CBC
HCT: 36.9 % (ref 36.0–46.0)
Hemoglobin: 12.2 g/dL (ref 12.0–15.0)
MCH: 28.4 pg (ref 26.0–34.0)
MCHC: 33.1 g/dL (ref 30.0–36.0)
MCV: 86 fL (ref 80.0–100.0)
Platelets: 243 10*3/uL (ref 150–400)
RBC: 4.29 MIL/uL (ref 3.87–5.11)
RDW: 14.6 % (ref 11.5–15.5)
WBC: 12.4 10*3/uL — ABNORMAL HIGH (ref 4.0–10.5)
nRBC: 0 % (ref 0.0–0.2)

## 2019-10-20 LAB — GLUCOSE, CAPILLARY
Glucose-Capillary: 154 mg/dL — ABNORMAL HIGH (ref 70–99)
Glucose-Capillary: 126 mg/dL — ABNORMAL HIGH (ref 70–99)
Glucose-Capillary: 92 mg/dL (ref 70–99)
Glucose-Capillary: 128 mg/dL — ABNORMAL HIGH (ref 70–99)

## 2019-10-20 MED ORDER — POTASSIUM CHLORIDE CRYS ER 20 MEQ PO TBCR
40.0000 meq | EXTENDED_RELEASE_TABLET | Freq: Once | ORAL | Status: AC
  Administered 2019-10-20: 40 meq via ORAL
  Filled 2019-10-20: qty 2

## 2019-10-20 MED ORDER — THIAMINE HCL 100 MG/ML IJ SOLN
250.0000 mg | Freq: Three times a day (TID) | INTRAVENOUS | Status: DC
  Administered 2019-10-20 – 2019-10-22 (×8): 250 mg via INTRAVENOUS
  Filled 2019-10-20 (×11): qty 2.5

## 2019-10-20 NOTE — Consult Note (Addendum)
NEURO HOSPITALIST Followup Note   Subjective/Review of History:                                                                                                                            Monica Bauer is an 63 y.o. female with history of diabetes, hypertension and prior episode of altered mental status which was presumed to be transient global amnesia in 2015, presented on 11/15 for episodes of altered mentation where she appeared confused and had speech difficulty - predominantly garbled and slurred speech. In between the spells she appeared to be normal. However, there was no history of seizures or bowel/bladder incontinence to suggest any seizure activity. MRI was negative for acute stroke or other acute/subacute focal lesion. Labs reveal elevated CK as well as leukocytosis Empiric thiamine was started while awaiting thiamine level. She was started on IV acyclovir for possible HSV encephalitis. Attempt at LP in ED as well as fluoro-guided LP were both unsuccessful. EEG was normal.   Neurology re-consulted today for a new episode seen by primary team where patient had lip smacking, was very delayed in answering questions, had some odd hand movements and needed to be told exactly what to do. Her cognition was impaired. This episode was witnessed by hospitalist. The patient returned to baseline afterwards. Given the clinical features and transient nature of the spell, it was felt most likely to be epileptic.   Past Medical History:  Diagnosis Date  . Breast cancer ([place])   . Diabetes mellitus without complication ([place])   . Hypertension     Past Surgical History:  Procedure Laterality Date  . BREAST LUMPECTOMY      History reviewed. No pertinent family history.           Social History:  reports that she has quit smoking. She does not have any smokeless tobacco history on file. She reports that she does not drink alcohol. No history on file for drug.  Allergies   Allergen Reactions  . Other     Pt does not know med allergy    MEDICATIONS:                                                                                                                     Scheduled: . enoxaparin (LOVENOX) injection  40 mg Subcutaneous Q24H  . insulin aspart  0-15 Units Subcutaneous TID WC  . insulin aspart  0-5 Units Subcutaneous QHS  . sodium chloride flush  3 mL Intravenous Q12H   Continuous: . sodium chloride 50 mL/hr at 10/20/19 0613  . acyclovir 660 mg (10/20/19 0838)  . thiamine injection 250 mg (10/20/19 1157)   [id number] **OR** acetaminophen, HYDROcodone-acetaminophen, labetalol, lip balm, ondansetron **OR** ondansetron (ZOFRAN) IV, polyethylene glycol   Blood pressure 137/75, pulse 76, temperature 98.6 F (37 C), temperature source Oral, resp. rate 18, height 5\' 2"  (1.575 m), weight 90.3 kg, SpO2 98 %.   General Examination:                                                                                                       Physical Exam  Constitutional: Appears well-developed and well-nourished.  Psych: Affect appropriate to situation Eyes: Normal external eye and conjunctiva. HENT: Normocephalic, no lesions, without obvious abnormality.   Musculoskeletal-no joint tenderness, deformity or swelling Cardiovascular: Normal rate and regular rhythm.  Respiratory: Effort normal, non-labored breathing saturations WNL GI: Soft.  No distension. There is no tenderness.  Skin: WDI  Neurological Examination Mental Status: Awake with decreased level of alertness. Oriented but slow to respond to questions. She did answer questions with slow, hypophonic speech that was non-dysarthric. Patient appeared confused at times. Limited speech output was fluent. She followed commands slowly but correctly.  Cranial Nerves: II:  Visual fields grossly normal, PERRL III,IV, VI: slight  ptosis of right eye, EOMI V,VII: smile symmetric, facial light touch  sensation normal bilaterally VIII: hearing normal bilaterally IX,X: uvula rises symmetrically XI: bilateral shoulder shrug XII: midline tongue extension Motor: Right : Upper extremity   5/5  Left:     Upper extremity   5/5  Lower extremity   5/5   Lower extremity   5/5 Tone and bulk:normal tone throughout; no atrophy noted Sensory: light touch intact throughout, bilaterally Cerebellar: Normal finger-to-nose  Gait: Deferred   Lab Results: Basic Metabolic Panel: Recent Labs  Lab 10/17/19 1657 10/18/19 0404 10/19/19 0437 10/20/19 0521  NA 139 144 145 142  K 3.7 3.4* 3.5 3.2*  CL 107 106 107 107  CO2 21* 21* 22 24  GLUCOSE 136* 94 130* 186*  BUN 25* 21 17 16   CREATININE 1.14* 1.07* 1.60* 1.36*  CALCIUM 9.8 9.5 9.8 9.2    CBC: Recent Labs  Lab 10/17/19 1657 10/18/19 0404 10/19/19 0437 10/20/19 0521  WBC 17.6* 13.2* 14.2* 12.4*  NEUTROABS 12.5* 7.7  --   --   HGB 12.7 12.4 13.3 12.2  HCT 38.6 41.1 39.5 36.9  MCV 85.8 94.5 84.9 86.0  PLT 299 245 272 243    Cardiac Enzymes: Recent Labs  Lab 10/17/19 1657 10/18/19 0404  CKTOTAL 808* 680*    Imaging: Dg Fluoro Rm 1-60 Min  Result Date: 10/18/2019 CLINICAL DATA:  Encephalopathy with leukocytosis EXAM: DIAGNOSTIC LUMBAR PUNCTURE UNDER FLUOROSCOPIC GUIDANCE FLUOROSCOPY TIME:  Fluoroscopy Time:  1 minutes 12 seconds Radiation Exposure Index (if provided by the fluoroscopic device): 14.5 mGy Number of Acquired Spot Images: 1 PROCEDURE: Informed consent  was obtained from the patient prior to the procedure, including potential complications of headache, allergy, and pain. With the patient prone, the lower back was prepped with Betadine. 1% Lidocaine was used for local anesthesia. Lumbar puncture was attempted at the L4-5 and L5-S1 levels using a 20 gauge needle. Patient complained of significant pain/discomfort, particularly with subcutaneous lidocaine administration. Unfortunately, despite multiple attempts at two different  levels, the procedure was unsuccessful. The patient tolerated the attempted procedure well and there were no apparent complications. IMPRESSION: Unsuccessful fluoroscopic guided lumbar puncture attempted at L4-5 and L5-S1. Electronically Signed   By: Julian Hy M.D.   On: 10/18/2019 14:27    Assessment:  63 year old female with history of diabetes, hypertension and prior episode of altered mental status which was presumed to be transient global amnesia in 2015, presents for episodes of altered mentation where she appears confused and has speech difficulty-predominantly garbled and slurred speech. In between the spells she appears to be normal. No history of seizures or bowel bladder incontinence to suggest any seizure activity. Today had another episode that included lip smacking,and abnormal hand gestures.  Prior EEG has shown no epileptiform discharges, but will connect to LTM  Given description of this spell. DDx includes incipient dementia with cognitive fluctuations versus seizure.   Recommendations: - LTM EEG (ordered) - Continue full course of acyclovir - Neurology will continue to follow with you.   Laurey Morale, MSN, NP-C [facility] [phone]  Electronically signed: Dr. Kerney Elbe 10/20/2019, 12:07 PM

## 2019-10-20 NOTE — Progress Notes (Signed)
LTM EEG hooked up and running - no initial skin breakdown - push button tested - neuro notified.  

## 2019-10-20 NOTE — Progress Notes (Signed)
Patient still with flat affect, alertx4 throughout shift but appears more engaging tonight at dinner. Son at bedside and updated.  No other distress noted. Will continue to monitor.  Ave Filter, RN

## 2019-10-20 NOTE — Progress Notes (Addendum)
Progress Note    Monica Bauer  YIF:[id number] DOB: [birth year]  DOA: 10/17/2019 PCP: Thornton Dales I, MD    Brief Narrative:     Medical records reviewed and are as summarized below:  Monica Bauer is an 63 y.o. female with medical history significant for hypertension, breast cancer, and type 2 diabetes mellitus, presenting to the emergency department for evaluation of confusion.  Patient was brought in to the ED by her son who has been concerned for waxing and waning confusion over the past 3 to 4 days.  Patient seemed to be in her usual state of health until she seemed to be slightly confused on 10/13/2019.  She has seemed to be lucid at times, but overall seems to be worsening, not caring for herself, and calling family in the middle of the night with marked confusion and garbled speech.  Patient is normally alert and fully oriented, manages all of her ADLs, and also cares for her elderly mother.    Assessment/Plan:   Principal Problem:   Acute encephalopathy Active Problems:   Hypertension   Diabetes mellitus without complication (HCC)   Mild renal insufficiency   Breast cancer (HCC)   Elevated vitamin B12 level   AMS (altered mental status)    Acute encephalopathy- ? Seizures vs worsening of dementia -Presented 11/19 with 3-4 days of waxing and waning confusion, not caring for self or her 52 yo mother with whom she resides -No acute findings noted on head CT or MRI brain. -Neurology evaluated: broad differential diagnosis-- seizures vs infection vs worsening dementia-- LP unsuccessful at bedside and with IR:  empiric acyclovirfor full treatment (7 days?) -B1 pending- getting empiric treatment -RPR and UDS- negative   Elevated B12 -? Etiology -does not have B12 on home meds so not convinced of over-replacement  H/o breast cancer 2013 -. Malignant neoplasm of upper-outer quadrant of right female breast, unspecified estrogen receptor status (CMS-HCC)    DIAGNOSIS: Right LQ 10/2012 Stage 1 T1 c No,breast cancer invasive, HER-2 negative, ER positive 95%,PR 3-5 % node negative. Recurrence score 2, recurrence rate 4%. received radiation and arimidex 01/2013  Continue arimidex per last progress note: 11/2018 by oncology- [facility]  Type II DM -A1c 6.7%   -holding oral agents -SSI for optimal control -carb mod diet  AKI -IVF hydration once IV replaced -strict I/Os  Hypertension -home meds have still not been reconciled by pharmacy  Hypokalemia -replace  obesity Body mass index is 36.4 kg/m.   Family Communication/Anticipated D/C date and plan/Code Status   DVT prophylaxis: Lovenox ordered. Code Status: Full Code.  Family Communication: called son, no answer Disposition Plan: pending improvement/treatment with IV acyclovir/ overnight EEG   Medical Consultants:    Neurology  Subjective:   Staring, only saying few words if asked  Objective:    Vitals:   10/19/19 2303 10/20/19 0511 10/20/19 0727 10/20/19 1159  BP: (!) 145/73 135/61 (!) 160/91 137/75  Pulse: 72 80 93 76  Resp: _0 Temp: 98.4 F (36.9 C) 98.1 F (36.7 C) 99.1 F (37.3 C) 98.6 F (37 C)  TempSrc: Oral Oral Oral Oral  SpO2: 96% 90% 97% 98%  Weight:      Height:        Intake/Output Summary (Last 24 hours) at 10/20/2019 1236 Last data filed at 10/20/2019 1158 Gross per 24 hour  Intake 2056.56 ml  Output --  Net 2056.56 ml   Filed Weights   10/17/19 1655  Weight: 90.3  kg    Exam: In bed, sighing/shoulder shrugging, lip smacking at time Very slow to answer questions -later seen with neurology but overall improved with speed and fluency of answers -rrr No increased work of breathing +BS, soft, NT    Data Reviewed:   I have personally reviewed following labs and imaging studies:  Labs: Labs show the following:   Basic Metabolic Panel: Recent Labs  Lab 10/17/19 1657 10/18/19 0404 10/19/19 0437 10/20/19 0521   NA 139 144 145 142  K 3.7 3.4* 3.5 3.2*  CL 107 106 107 107  CO2 21* 21* 22 24  GLUCOSE 136* 94 130* 186*  BUN 25* _0 CREATININE 1.14* 1.07* 1.60* 1.36*  CALCIUM 9.8 9.5 9.8 9.2   GFR Estimated Creatinine Clearance: 44.2 mL/min (A) (by C-G formula based on SCr of 1.36 mg/dL (H)). Liver Function Tests: Recent Labs  Lab 10/17/19 1657  AST 40  ALT 31  ALKPHOS 56  BILITOT 1.2  PROT 8.7*  ALBUMIN 4.5   No results for input(s): LIPASE, AMYLASE in the last 168 hours. Recent Labs  Lab 10/18/19 0116  AMMONIA 39*   Coagulation profile No results for input(s): INR, PROTIME in the last 168 hours.  CBC: Recent Labs  Lab 10/17/19 1657 10/18/19 0404 10/19/19 0437 10/20/19 0521  WBC 17.6* 13.2* 14.2* 12.4*  NEUTROABS 12.5* 7.7  --   --   HGB 12.7 12.4 13.3 12.2  HCT 38.6 41.1 39.5 36.9  MCV 85.8 94.5 84.9 86.0  PLT 299 245 272 243   Cardiac Enzymes: Recent Labs  Lab 10/17/19 1657 10/18/19 0404  CKTOTAL 808* 680*   BNP (last 3 results) No results for input(s): PROBNP in the last 8760 hours. CBG: Recent Labs  Lab 10/19/19 1123 10/19/19 1612 10/19/19 2132 10/20/19 0612 10/20/19 1103  GLUCAP 96 131* 136* 154* 126*   D-Dimer: No results for input(s): DDIMER in the last 72 hours. Hgb A1c: Recent Labs    10/18/19 0113  HGBA1C 6.7*   Lipid Profile: No results for input(s): CHOL, HDL, LDLCALC, TRIG, CHOLHDL, LDLDIRECT in the last 72 hours. Thyroid function studies: Recent Labs    10/18/19 0113  TSH 0.964   Anemia work up: Recent Labs    10/18/19 0113  VITAMINB12 5,711*   Sepsis Labs: Recent Labs  Lab 10/17/19 1657 10/18/19 0404 10/19/19 0437 10/20/19 0521  WBC 17.6* 13.2* 14.2* 12.4*    Microbiology Recent Results (from the past 240 hour(s))  SARS CORONAVIRUS 2 (TAT 6-24 HRS) Nasopharyngeal Nasopharyngeal Swab     Status: None   Collection Time: 10/17/19  6:35 PM   Specimen: Nasopharyngeal Swab  Result Value Ref Range Status   SARS  Coronavirus 2 NEGATIVE NEGATIVE Final    Comment: (NOTE) SARS-CoV-2 target nucleic acids are NOT DETECTED. The SARS-CoV-2 RNA is generally detectable in upper and lower respiratory specimens during the acute phase of infection. Negative results do not preclude SARS-CoV-2 infection, do not rule out co-infections with other pathogens, and should not be used as the sole basis for treatment or other patient management decisions. Negative results must be combined with clinical observations, patient history, and epidemiological information. The expected result is Negative. Fact Sheet for Patients: SugarRoll.be Fact Sheet for Healthcare Providers: https://www.woods-mathews.com/ This test is not yet approved or cleared by the [place] FDA and  has been authorized for detection and/or diagnosis of SARS-CoV-2 by FDA under an Emergency Use Authorization (EUA). This EUA will remain  in effect (meaning this test  can be used) for the duration of the COVID-19 declaration under Section 56 4(b)(1) of the Act, 21 U.S.C. section 360bbb-3(b)(1), unless the authorization is terminated or revoked sooner. Performed at [facility], [place] [address] [place], [place] [address]     Procedures and diagnostic studies:  Dg Fluoro Rm 1-60 Min  Result Date: 10/18/2019 CLINICAL DATA:  Encephalopathy with leukocytosis EXAM: DIAGNOSTIC LUMBAR PUNCTURE UNDER FLUOROSCOPIC GUIDANCE FLUOROSCOPY TIME:  Fluoroscopy Time:  1 minutes 12 seconds Radiation Exposure Index (if provided by the fluoroscopic device): 14.5 mGy Number of Acquired Spot Images: 1 PROCEDURE: Informed consent was obtained from the patient prior to the procedure, including potential complications of headache, allergy, and pain. With the patient prone, the lower back was prepped with Betadine. 1% Lidocaine was used for local anesthesia. Lumbar puncture was attempted at the L4-5 and L5-S1 levels using a 20  gauge needle. Patient complained of significant pain/discomfort, particularly with subcutaneous lidocaine administration. Unfortunately, despite multiple attempts at two different levels, the procedure was unsuccessful. The patient tolerated the attempted procedure well and there were no apparent complications. IMPRESSION: Unsuccessful fluoroscopic guided lumbar puncture attempted at L4-5 and L5-S1. Electronically Signed   By: Julian Hy M.D.   On: 10/18/2019 14:27    Medications:    enoxaparin (LOVENOX) injection  40 mg Subcutaneous Q24H   insulin aspart  0-15 Units Subcutaneous TID WC   insulin aspart  0-5 Units Subcutaneous QHS   sodium chloride flush  3 mL Intravenous Q12H   Continuous Infusions:  sodium chloride 50 mL/hr at 10/20/19 2111   acyclovir 660 mg (10/20/19 0838)   thiamine injection 250 mg (10/20/19 1157)     LOS: 2 days   Geradine Girt  [facility]   How to contact the [facility] Attending or Consulting provider [employer] or covering provider during after hours [place], for this patient?  1. Check the care team in [facility] and look for a) attending/consulting [facility] provider listed and b) the [facility] team listed 2. Log into [email or web address] and use [place]'s universal password to access. If you do not have the password, please contact the hospital operator. 3. Locate the [facility] provider you are looking for under [facility] and page to a number that you can be directly reached. 4. If you still have difficulty reaching the provider, please page the [facility] (Director on Call) for the Hospitalists listed on amion for assistance.  10/20/2019, 12:36 PM

## 2019-10-20 NOTE — Progress Notes (Signed)
Empiric IV thiamine ordered, 250 mg IV TID x 3 days. Thiamine level is still pending.   Will need to complete full course of IV acyclovir.   Will also need outpatient neurology follow up for evaluation of possible incipient dementia with cognitive fluctuations.   Neurology will sign off. Please call if there are additional questions.   Electronically signed: Dr. Kerney Elbe

## 2019-10-20 NOTE — Progress Notes (Signed)
Patient is alert x4, slow with response and not interactive with flat affect throughout shift. VSS, no other distress noted.  Son updated.  Will continue to monitor.  Ave Filter, RN

## 2019-10-21 LAB — GLUCOSE, CAPILLARY
Glucose-Capillary: 109 mg/dL — ABNORMAL HIGH (ref 70–99)
Glucose-Capillary: 145 mg/dL — ABNORMAL HIGH (ref 70–99)
Glucose-Capillary: 117 mg/dL — ABNORMAL HIGH (ref 70–99)
Glucose-Capillary: 238 mg/dL — ABNORMAL HIGH (ref 70–99)

## 2019-10-21 MED ORDER — ADULT MULTIVITAMIN W/MINERALS CH
1.0000 | ORAL_TABLET | Freq: Every day | ORAL | Status: DC
  Administered 2019-10-21 – 2019-10-28 (×8): 1 via ORAL
  Filled 2019-10-21 (×8): qty 1

## 2019-10-21 MED ORDER — ATORVASTATIN CALCIUM 10 MG PO TABS
10.0000 mg | ORAL_TABLET | Freq: Every evening | ORAL | Status: DC
  Administered 2019-10-21 – 2019-10-27 (×7): 10 mg via ORAL
  Filled 2019-10-21 (×6): qty 1

## 2019-10-21 MED ORDER — LORAZEPAM 2 MG/ML IJ SOLN: 2.0000 mg | INTRAMUSCULAR | Status: DC | PRN

## 2019-10-21 NOTE — Care Management Important Message (Signed)
Important Message  Patient Details  Name: Monica Bauer MRN: [id number] Date of Birth: May 26, 1956   Medicare Important Message Given:  Yes     [name] [address] 10/21/2019, 3:07 PM

## 2019-10-21 NOTE — Progress Notes (Signed)
Reason for consult: Transient alteration of awareness  Subjective:  Patient and son at bedside deny any further seizure-like episodes since yesterday   ROS: negative except above  Examination  Vital signs in last 24 hours: Temp:  [98.3 F (36.8 C)-99.1 F (37.3 C)] 98.3 F (36.8 C) (11/23 1231) Pulse Rate:  [64-79] 71 (11/23 1231) Resp:  [16-18] 16 (11/23 1231) BP: (125-143)/(68-86) 139/86 (11/23 1231) SpO2:  [97 %-99 %] 97 % (11/23 1231)  General: lying in bed, not in apparent distress CVS: pulse-normal rate and rhythm RS: breathing comfortably Extremities: normal   Neuro: MS: Alert, oriented to time place person, follows commands CN: pupils equal and reactive,  EOMI, face symmetric, tongue midline, normal sensation over face, Motor: 5/5 strength in all 4 extremities Coordination: normal Gait: not tested  Basic Metabolic Panel: Recent Labs  Lab 10/17/19 1657 10/18/19 0404 10/19/19 0437 10/20/19 0521  NA 139 144 145 142  K 3.7 3.4* 3.5 3.2*  CL 107 106 107 107  CO2 21* 21* 22 24  GLUCOSE 136* 94 130* 186*  BUN 25* 21 17 16   CREATININE 1.14* 1.07* 1.60* 1.36*  CALCIUM 9.8 9.5 9.8 9.2    CBC: Recent Labs  Lab 10/17/19 1657 10/18/19 0404 10/19/19 0437 10/20/19 0521  WBC 17.6* 13.2* 14.2* 12.4*  NEUTROABS 12.5* 7.7  --   --   HGB 12.7 12.4 13.3 12.2  HCT 38.6 41.1 39.5 36.9  MCV 85.8 94.5 84.9 86.0  PLT 299 245 272 243     Coagulation Studies: No results for input(s): LABPROT, INR in the last 72 hours.  Imaging MRI brain 10/17/2019: No acute abnormality.    ASSESSMENT AND PLAN  63 year old female with history of diabetes,hypertension and prior episode of altered mental status which was presumed to be transient global amnesia in 2015, presents for episodes of altered mentation where she appears confused and has speech difficulty-predominantly garbled and slurred speech. In between the spells she appears to be normal. No history of seizures or  bowel bladder incontinence to suggest any seizure activity. Today had another episode that included lip smacking,and abnormal hand gestures.    Transient alteration of awareness -Prior EEG has shown no epileptiform discharges. Therefore, connected to LTM   - Given description of this spell. DDx includes incipient dementia with cognitive fluctuations versus seizure.   Recommendations -Continue LTM EEG for another day for characterization of spells -So far no evidence of interictal/ictal activity, therefore will defer starting antiepileptic medications -Seizure precautions -As needed IV Ativan for generalized tonic-clonic seizure lasting more than 2 minutes   I have spent a total of   25 minutes with the patient reviewing hospital notes,  test results, labs and examining the patient as well as establishing an assessment and plan that was discussed personally with the patient and son at bedside.  > 50% of time was spent in direct patient care.

## 2019-10-21 NOTE — Progress Notes (Signed)
Progress Note    Monica Bauer  YIA:[id number] DOB: [birth year]  DOA: 10/17/2019 PCP: Thornton Dales I, MD    Brief Narrative:     Medical records reviewed and are as summarized below:  Monica Bauer is an 63 y.o. female with medical history significant for hypertension, breast cancer, and type 2 diabetes mellitus, presenting to the emergency department for evaluation of confusion.  Patient was brought in to the ED by her son who has been concerned for waxing and waning confusion over the past 3 to 4 days.  Patient seemed to be in her usual state of health until she seemed to be slightly confused on 10/13/2019.  She has seemed to be lucid at times, but overall seems to be worsening, not caring for herself, and calling family in the middle of the night with marked confusion and garbled speech.  Patient is normally alert and fully oriented, manages all of her ADLs, and also cares for her elderly mother.   Reviewed chart further: 12/14 seen at [facility]- had MRI for memory issues 10/15 was at [facility]- seen by ER doc-- diagnosed with TGA  Assessment/Plan:   Principal Problem:   Acute encephalopathy Active Problems:   Hypertension   Diabetes mellitus without complication (HCC)   Mild renal insufficiency   Breast cancer (HCC)   Elevated vitamin B12 level   AMS (altered mental status)    Acute encephalopathy- ? Seizures vs worsening of dementia -Presented 11/19 with 3-4 days of waxing and waning confusion, not caring for self or her 35 yo mother with whom she resides -No acute findings noted on head CT or MRI brain. -chart review: 12/14 seen at [facility]- had MRI for memory issues 10/15 was at [facility]- seen by ER doc-- diagnosed with TGA -Neurology evaluated: broad differential diagnosis-- seizures vs infection vs worsening dementia-- LP unsuccessful at bedside and with IR:  empiric acyclovirfor full treatment (7 days?) -B1 pending- getting empiric treatment -RPR and UDS- negative   -over night EEG not revealing, plan to continue overnight again to see if episode can be captured  Elevated B12 -? Etiology -does not have B12 on home meds so not convinced of over-replacement  H/o breast cancer 2013 -. Malignant neoplasm of upper-outer quadrant of right female breast, unspecified estrogen receptor status (CMS-HCC)  DIAGNOSIS: Right LQ 10/2012 Stage 1 T1 c No,breast cancer invasive, HER-2 negative, ER positive 95%,PR 3-5 % node negative. Recurrence score 2, recurrence rate 4%. received radiation and arimidex 01/2013  Continue arimidex per last progress note: 11/2018 by oncology- [facility]  Type II DM -A1c 6.7%   -holding oral agents -SSI for optimal control -carb mod diet  AKI -IVF hydration once IV replaced -strict I/Os  Hypertension -resume home meds as able  Hypokalemia -replaced  obesity Body mass index is 36.4 kg/m.   Family Communication/Anticipated D/C date and plan/Code Status   DVT prophylaxis: Lovenox ordered. Code Status: Full Code.  Family Communication: called son, no answer Disposition Plan: pending improvement/treatment with IV acyclovir/ overnight EEG   Medical Consultants:    Neurology  Subjective:   Reviewed chart further: 12/14 seen at [facility]- had MRI for memory issues 10/15 was at [facility]- seen by ER doc-- diagnosed with TGA   Objective:    Vitals:   10/20/19 1159 10/20/19 1604 10/20/19 2159 10/21/19 0516  BP: 137/75 128/72 125/68 (!) 143/78  Pulse: 76 74 79 64  Resp: '18 18 18 18  ' Temp: 98.6 F (37 C) 99.1 F (37.3 C) 98.7 F (  37.1 C) 98.7 F (37.1 C)  TempSrc: Oral Oral Oral Oral  SpO2: 98% 99% 97% 97%  Weight:      Height:        Intake/Output Summary (Last 24 hours) at 10/21/2019 1058 Last data filed at 10/21/2019 1012 Gross per 24 hour  Intake 1564.93 ml  Output 700 ml  Net 864.93 ml   Filed Weights   10/17/19 1655  Weight: 90.3 kg    Exam: In bed, hooked up to EEG nad Speech fluent A+Ox3    Data Reviewed:   I have personally reviewed following labs and imaging studies:  Labs: Labs show the following:   Basic Metabolic Panel: Recent Labs  Lab 10/17/19 1657 10/18/19 0404 10/19/19 0437 10/20/19 0521  NA 139 144 145 142  K 3.7 3.4* 3.5 3.2*  CL 107 106 107 107  CO2 21* 21* 22 24  GLUCOSE 136* 94 130* 186*  BUN 25* '21 17 16  ' CREATININE 1.14* 1.07* 1.60* 1.36*  CALCIUM 9.8 9.5 9.8 9.2   GFR Estimated Creatinine Clearance: 44.2 mL/min (A) (by C-G formula based on SCr of 1.36 mg/dL (H)). Liver Function Tests: Recent Labs  Lab 10/17/19 1657  AST 40  ALT 31  ALKPHOS 56  BILITOT 1.2  PROT 8.7*  ALBUMIN 4.5   No results for input(s): LIPASE, AMYLASE in the last 168 hours. Recent Labs  Lab 10/18/19 0116  AMMONIA 39*   Coagulation profile No results for input(s): INR, PROTIME in the last 168 hours.  CBC: Recent Labs  Lab 10/17/19 1657 10/18/19 0404 10/19/19 0437 10/20/19 0521  WBC 17.6* 13.2* 14.2* 12.4*  NEUTROABS 12.5* 7.7  --   --   HGB 12.7 12.4 13.3 12.2  HCT 38.6 41.1 39.5 36.9  MCV 85.8 94.5 84.9 86.0  PLT 299 245 272 243   Cardiac Enzymes: Recent Labs  Lab 10/17/19 1657 10/18/19 0404  CKTOTAL 808* 680*   BNP (last 3 results) No results for input(s): PROBNP in the last 8760 hours. CBG: Recent Labs  Lab 10/20/19 1103 10/20/19 1602 10/20/19 2210 10/21/19 0515 10/21/19 1049  GLUCAP 126* 92 128* 109* 145*   D-Dimer: No results for input(s): DDIMER in the last 72 hours. Hgb A1c: No results for input(s): HGBA1C in the last 72 hours. Lipid Profile: No results for input(s): CHOL, HDL, LDLCALC, TRIG, CHOLHDL, LDLDIRECT in the last 72 hours. Thyroid function studies: No results for input(s): TSH, T4TOTAL, T3FREE, THYROIDAB in the last 72 hours.  Invalid input(s): FREET3 Anemia work up: No results for input(s): VITAMINB12, FOLATE, FERRITIN, TIBC, IRON, RETICCTPCT in the last 72 hours. Sepsis Labs: Recent Labs  Lab 10/17/19 1657  10/18/19 0404 10/19/19 0437 10/20/19 0521  WBC 17.6* 13.2* 14.2* 12.4*    Microbiology Recent Results (from the past 240 hour(s))  SARS CORONAVIRUS 2 (TAT 6-24 HRS) Nasopharyngeal Nasopharyngeal Swab     Status: None   Collection Time: 10/17/19  6:35 PM   Specimen: Nasopharyngeal Swab  Result Value Ref Range Status   SARS Coronavirus 2 NEGATIVE NEGATIVE Final    Comment: (NOTE) SARS-CoV-2 target nucleic acids are NOT DETECTED. The SARS-CoV-2 RNA is generally detectable in upper and lower respiratory specimens during the acute phase of infection. Negative results do not preclude SARS-CoV-2 infection, do not rule out co-infections with other pathogens, and should not be used as the sole basis for treatment or other patient management decisions. Negative results must be combined with clinical observations, patient history, and epidemiological information. The expected result is  Negative. Fact Sheet for Patients: SugarRoll.be Fact Sheet for Healthcare Providers: https://www.woods-mathews.com/ This test is not yet approved or cleared by the [place] FDA and  has been authorized for detection and/or diagnosis of SARS-CoV-2 by FDA under an Emergency Use Authorization (EUA). This EUA will remain  in effect (meaning this test can be used) for the duration of the COVID-19 declaration under Section 56 4(b)(1) of the Act, 21 U.S.C. section 360bbb-3(b)(1), unless the authorization is terminated or revoked sooner. Performed at [facility], [place] [address] [place], [place] [address]     Procedures and diagnostic studies:  No results found.  Medications:   . enoxaparin (LOVENOX) injection  40 mg Subcutaneous Q24H  . insulin aspart  0-15 Units Subcutaneous TID WC  . insulin aspart  0-5 Units Subcutaneous QHS  . sodium chloride flush  3 mL Intravenous Q12H   Continuous Infusions: . sodium chloride 50 mL/hr at 10/20/19 0613  .  acyclovir 660 mg (10/21/19 0827)  . thiamine injection 250 mg (10/21/19 0007)     LOS: 3 days   Geradine Girt  [facility]   How to contact the [facility] Attending or Consulting provider [facility] or covering provider during after hours [place], for this patient?  1. Check the care team in [facility] and look for a) attending/consulting TRH provider listed and b) the [facility] team listed 2. Log into [email or web address] and use 's universal password to access. If you do not have the password, please contact the hospital operator. 3. Locate the [facility] provider you are looking for under [facility] and page to a number that you can be directly reached. 4. If you still have difficulty reaching the provider, please page the [facility] (Director on Call) for the Hospitalists listed on amion for assistance.  10/21/2019, 10:58 AM

## 2019-10-21 NOTE — Progress Notes (Signed)
LTM maint complete - no skin breakdown under:   t3, t4, o2

## 2019-10-21 NOTE — Procedures (Addendum)
Patient Name: Monica Bauer  MRN: [id number]  Epilepsy Attending: Lora Havens  Referring Physician/Provider: Dr. Kerney Elbe Duration:  10/20/2019 1439 2 10/21/2019 1439  Patient history: 63 year old female with altered mental status.  EEG to evaluate for seizure.  Level of alertness: Awake, sleep  AEDs during EEG study: None  Technical aspects: This EEG study was done with scalp electrodes positioned according to the 10-20 International system of electrode placement. Electrical activity was acquired at a sampling rate of 500Hz  and reviewed with a high frequency filter of 70Hz  and a low frequency filter of 1Hz . EEG data were recorded continuously and digitally stored.   Description: The posterior dominant rhythm consists of 10 Hz activity of moderate voltage (25-35 uV) seen predominantly in posterior head regions, symmetric and reactive to eye opening and eye closing.  Sleep was characterized by vertex waves, sleep spindles (12 to 14 Hz), maximal frontocentral.   Photic stimulation and hyperventilation were not performed.   IMPRESSION: This study is within normal limits. No seizures or epileptiform discharges were seen throughout the recording.  Memory [name] [name]

## 2019-10-21 NOTE — Progress Notes (Signed)
Attempted to call EEG lab so they could come and remove EEG off pt. No answer left a message will call again soon.

## 2019-10-21 NOTE — Plan of Care (Signed)

## 2019-10-22 ENCOUNTER — Encounter (HOSPITAL_COMMUNITY): Payer: Self-pay | Admitting: *Deleted

## 2019-10-22 LAB — BASIC METABOLIC PANEL
Anion gap: 12 (ref 5–15)
BUN: 12 mg/dL (ref 8–23)
CO2: 23 mmol/L (ref 22–32)
Calcium: 9.3 mg/dL (ref 8.9–10.3)
Chloride: 106 mmol/L (ref 98–111)
Creatinine, Ser: 1.01 mg/dL — ABNORMAL HIGH (ref 0.44–1.00)
GFR calc Af Amer: >60 mL/min (ref >60)
GFR calc non Af Amer: 59 mL/min — ABNORMAL LOW (ref >60)
Glucose, Bld: 93 mg/dL (ref 70–99)
Potassium: 3.2 mmol/L — ABNORMAL LOW (ref 3.5–5.1)
Sodium: 141 mmol/L (ref 135–145)

## 2019-10-22 LAB — VITAMIN B1: Vitamin B1 (Thiamine): 121.1 nmol/L (ref 66.5–200.0)

## 2019-10-22 LAB — CBC
HCT: 36.3 % (ref 36.0–46.0)
Hemoglobin: 12.3 g/dL (ref 12.0–15.0)
MCH: 28.7 pg (ref 26.0–34.0)
MCHC: 33.9 g/dL (ref 30.0–36.0)
MCV: 84.6 fL (ref 80.0–100.0)
Platelets: 264 10*3/uL (ref 150–400)
RBC: 4.29 MIL/uL (ref 3.87–5.11)
RDW: 13.9 % (ref 11.5–15.5)
WBC: 11.6 10*3/uL — ABNORMAL HIGH (ref 4.0–10.5)
nRBC: 0 % (ref 0.0–0.2)

## 2019-10-22 LAB — GLUCOSE, CAPILLARY
Glucose-Capillary: 120 mg/dL — ABNORMAL HIGH (ref 70–99)
Glucose-Capillary: 114 mg/dL — ABNORMAL HIGH (ref 70–99)
Glucose-Capillary: 164 mg/dL — ABNORMAL HIGH (ref 70–99)
Glucose-Capillary: 82 mg/dL (ref 70–99)

## 2019-10-22 MED ORDER — DEXTROSE 5 % IV SOLN
10.0000 mg/kg | Freq: Three times a day (TID) | INTRAVENOUS | Status: AC
  Administered 2019-10-22 – 2019-10-25 (×7): 620 mg via INTRAVENOUS
  Filled 2019-10-22 (×7): qty 12.4

## 2019-10-22 MED ORDER — POTASSIUM CHLORIDE CRYS ER 20 MEQ PO TBCR
40.0000 meq | EXTENDED_RELEASE_TABLET | Freq: Once | ORAL | Status: AC
  Administered 2019-10-22: 40 meq via ORAL
  Filled 2019-10-22: qty 2

## 2019-10-22 NOTE — Progress Notes (Signed)
Pharmacy Antibiotic Note  Monica Bauer is a 63 y.o. female admitted on 10/17/2019 with AMS, possible viral encephalitis.  Pharmacy has been consulted for Acyclovir dosing.  Unsuccessful LP.  Day # 5 Acyclovir.  Creatinine bumped up 11/21 and dosing interval adjusted from q8hrs to q12hrs. Creatinine back to baseline.  Weight also down to 79.9 kg from initial weight recorded 90.3 kg.   Plan:  Adjust Acyclovir 660 mg IV q12hrs to 620 mg IV q8hrs (~10 mg/kg adjusted body weight)  Follow renal function, clinical progress, and length of therapy.  Height: 5\' 2"  (157.5 cm) Weight: 176 lb 2.4 oz (79.9 kg) IBW/kg (Calculated) : 50.1 Adjusted BW: 62 kg  Temp (24hrs), Avg:98.3 F (36.8 C), Min:98 F (36.7 C), Max:98.6 F (37 C)  Recent Labs  Lab 10/17/19 1657 10/18/19 0404 10/19/19 0437 10/20/19 0521 10/22/19 0403  WBC 17.6* 13.2* 14.2* 12.4* 11.6*  CREATININE 1.14* 1.07* 1.60* 1.36* 1.01*    Estimated Creatinine Clearance: 55.8 mL/min (A) (by C-G formula based on SCr of 1.01 mg/dL (H)).    Allergies  Allergen Reactions  . Other     Pt does not know med allergy    Antimicrobials/antivirals this admission:  Acyclovir 11/20>>  Dose adjustments this admission:  11/21: adjusted q8hrs > q12hrs for creatinine up from 1.07  to 1.60  11/24: adjusted q12hrs > q8hrs for creatinine back to 1.01; dose also adjusted for change in weight  Microbiology results: 11/20 HIV: non-reactive  11/20 RPR: negative 11/19 COVID: negative  Thank you for allowing pharmacy to be a part of this patient's care.  Arty Baumgartner, [place] Pager: [phone] or phone: [phone] 10/22/2019 12:44 PM

## 2019-10-22 NOTE — Procedures (Signed)
Patient Name:Monica Bauer [id number] Epilepsy Attending:Danner Paulding [name] Referring Physician/Provider:Dr. Kerney Elbe Duration: 10/21/2019 1439 2 10/22/2019 1022  Patient history:63 year old female with altered mental status. EEG to evaluate for seizure.  Level of alertness:Awake, sleep  AEDs during EEG study:None  Technical aspects: This EEG study was done with scalp electrodes positioned according to the 10-20 International system of electrode placement. Electrical activity was acquired at a sampling rate of 500Hz  and reviewed with a high frequency filter of 70Hz  and a low frequency filter of 1Hz . EEG data were recorded continuously and digitally stored.  Description:The posterior dominant rhythm consists of10Hz  activity of moderate voltage (25-35 uV) seen predominantly in posterior head regions, symmetric and reactive to eye opening and eye closing.  Sleep was characterized by vertex waves, sleep spindles (12 to 14 Hz), maximal frontocentral.   Physiologic photic driving was seen during photic stimulation.  No EEG change was seen during hyperventilation.  IMPRESSION: This study is within normal limits. No seizures or epileptiform discharges were seen throughout the recording.  Yuji Walth [name]

## 2019-10-22 NOTE — Progress Notes (Addendum)
Progress Note    Monica Bauer  OMA:[id number] DOB: 08/28/1956  DOA: 10/17/2019 PCP: Monica Dales I, MD    Brief Narrative:     Medical records reviewed and are as summarized below:  Monica Bauer is an 63 y.o. female with medical history significant for hypertension, breast cancer, and type 2 diabetes mellitus, presenting to the emergency department for evaluation of confusion.  Patient was brought in to the ED by her son who has been concerned for waxing and waning confusion over the past 3 to 4 days.  Patient seemed to be in her usual state of health until she seemed to be slightly confused on 10/13/2019.  She has seemed to be lucid at times, but overall seems to be worsening, not caring for herself, and calling family in the middle of the night with marked confusion and garbled speech.  Patient is normally alert and fully oriented, manages all of her ADLs, and also cares for her elderly mother.   Reviewed chart further: 12/14 seen at [facility]- had MRI for memory issues 10/15 was at [facility]- seen by ER doc-- diagnosed with TGA  Assessment/Plan:   Principal Problem:   Acute encephalopathy Active Problems:   Hypertension   Diabetes mellitus without complication (HCC)   Mild renal insufficiency   Breast cancer (HCC)   Elevated vitamin B12 level   AMS (altered mental status)    Acute encephalopathy- ? Seizures vs worsening of dementia -Presented 11/19 with 3-4 days of waxing and waning confusion, not caring for self or her 39 yo mother with whom she resides -No acute findings noted on head CT or MRI brain. -chart review: 12/14 seen at [facility]- had MRI for memory issues 10/15 was at [facility]- seen by ER doc-- diagnosed with TGA -Neurology evaluated: broad differential diagnosis-- seizures vs infection vs worsening dementia-- LP unsuccessful at bedside and with IR:  empiric acyclovirfor 7 days (11/26)-- no clear evidence of HSV meningitis-- no EEG changes no doubt this diagnosis  and hence why not treating for 21 days -B1 pending- getting empiric treatment -RPR and UDS- negative  -LTM EEG: no seizures but patient did not sleep much, patient told neurology that she has OSA but did not like the CPAP so she does not use  Elevated B12 -? Etiology -does not have B12 on home meds so not convinced of over-replacement  H/o breast cancer 2013 -. Malignant neoplasm of upper-outer quadrant of right female breast, unspecified estrogen receptor status (CMS-HCC)  DIAGNOSIS: Right LQ 10/2012 Stage 1 T1 c No,breast cancer invasive, HER-2 negative, ER positive 95%,PR 3-5 % node negative. Recurrence score 2, recurrence rate 4%. received radiation and arimidex 01/2013  Continue arimidex per last progress note: 11/2018 by oncology- [facility]  Type II DM -A1c 6.7%   -holding oral agents -SSI for optimal control -carb mod diet  AKI -IVF hydration once IV replaced -strict Bauer/Os  Hypertension -resume home meds as able  Hypokalemia -replaced  obesity Body mass index is 32.22 kg/m.   Family Communication/Anticipated D/C date and plan/Code Status   DVT prophylaxis: Lovenox ordered. Code Status: Full Code.  Family Communication: spoke with son at bedside Disposition Plan: 7 days of IV acyclovir   Medical Consultants:    Neurology  Subjective:   Feels "fine"   Objective:    Vitals:   10/21/19 2348 10/22/19 0520 10/22/19 0520 10/22/19 1157  BP: (!) 157/89  (!) 161/86 (!) 152/92  Pulse: 89  72 85  Resp: 15  16 18  Temp: 98.4 F (36.9 C)  98 F (36.7 C) 98.3 F (36.8 C)  TempSrc: Oral  Oral Oral  SpO2: 98%  100% 100%  Weight:  79.9 kg    Height:       No intake or output data in the 24 hours ending 10/22/19 1427 Filed Weights   10/17/19 1655 10/22/19 0520  Weight: 90.3 kg 79.9 kg    Exam: In bed, son at bedside NAD rrr Speech fluent +BS, soft, NT   Data Reviewed:   Bauer have personally reviewed following labs and imaging studies:  Labs:  Labs show the following:   Basic Metabolic Panel: Recent Labs  Lab 10/17/19 1657 10/18/19 0404 10/19/19 0437 10/20/19 0521 10/22/19 0403  NA 139 144 145 142 141  K 3.7 3.4* 3.5 3.2* 3.2*  CL 107 106 107 107 106  CO2 21* 21* '22 24 23  ' GLUCOSE 136* 94 130* 186* 93  BUN 25* '21 17 16 12  ' CREATININE 1.14* 1.07* 1.60* 1.36* 1.01*  CALCIUM 9.8 9.5 9.8 9.2 9.3   GFR Estimated Creatinine Clearance: 55.8 mL/min (A) (by C-G formula based on SCr of 1.01 mg/dL (H)). Liver Function Tests: Recent Labs  Lab 10/17/19 1657  AST 40  ALT 31  ALKPHOS 56  BILITOT 1.2  PROT 8.7*  ALBUMIN 4.5   No results for input(s): LIPASE, AMYLASE in the last 168 hours. Recent Labs  Lab 10/18/19 0116  AMMONIA 39*   Coagulation profile No results for input(s): INR, PROTIME in the last 168 hours.  CBC: Recent Labs  Lab 10/17/19 1657 10/18/19 0404 10/19/19 0437 10/20/19 0521 10/22/19 0403  WBC 17.6* 13.2* 14.2* 12.4* 11.6*  NEUTROABS 12.5* 7.7  --   --   --   HGB 12.7 12.4 13.3 12.2 12.3  HCT 38.6 41.1 39.5 36.9 36.3  MCV 85.8 94.5 84.9 86.0 84.6  PLT 299 245 272 243 264   Cardiac Enzymes: Recent Labs  Lab 10/17/19 1657 10/18/19 0404  CKTOTAL 808* 680*   BNP (last 3 results) No results for input(s): PROBNP in the last 8760 hours. CBG: Recent Labs  Lab 10/21/19 1049 10/21/19 1559 10/21/19 2200 10/22/19 0523 10/22/19 1136  GLUCAP 145* 117* 238* 120* 114*   D-Dimer: No results for input(s): DDIMER in the last 72 hours. Hgb A1c: No results for input(s): HGBA1C in the last 72 hours. Lipid Profile: No results for input(s): CHOL, HDL, LDLCALC, TRIG, CHOLHDL, LDLDIRECT in the last 72 hours. Thyroid function studies: No results for input(s): TSH, T4TOTAL, T3FREE, THYROIDAB in the last 72 hours.  Invalid input(s): FREET3 Anemia work up: No results for input(s): VITAMINB12, FOLATE, FERRITIN, TIBC, IRON, RETICCTPCT in the last 72 hours. Sepsis Labs: Recent Labs  Lab 10/18/19  0404 10/19/19 0437 10/20/19 0521 10/22/19 0403  WBC 13.2* 14.2* 12.4* 11.6*    Microbiology Recent Results (from the past 240 hour(s))  SARS CORONAVIRUS 2 (TAT 6-24 HRS) Nasopharyngeal Nasopharyngeal Swab     Status: None   Collection Time: 10/17/19  6:35 PM   Specimen: Nasopharyngeal Swab  Result Value Ref Range Status   SARS Coronavirus 2 NEGATIVE NEGATIVE Final    Comment: (NOTE) SARS-CoV-2 target nucleic acids are NOT DETECTED. The SARS-CoV-2 RNA is generally detectable in upper and lower respiratory specimens during the acute phase of infection. Negative results do not preclude SARS-CoV-2 infection, do not rule out co-infections with other pathogens, and should not be used as the sole basis for treatment or other patient management decisions. Negative results must  be combined with clinical observations, patient history, and epidemiological information. The expected result is Negative. Fact Sheet for Patients: SugarRoll.be Fact Sheet for Healthcare Providers: https://www.woods-mathews.com/ This test is not yet approved or cleared by the [place] FDA and  has been authorized for detection and/or diagnosis of SARS-CoV-2 by FDA under an Emergency Use Authorization (EUA). This EUA will remain  in effect (meaning this test can be used) for the duration of the COVID-19 declaration under Section 56 4(b)(1) of the Act, 21 U.S.C. section 360bbb-3(b)(1), unless the authorization is terminated or revoked sooner. Performed at [facility], [place] [address] [place], [place] [address]     Procedures and diagnostic studies:  No results found.  Medications:   . atorvastatin  10 mg Oral QPM  . enoxaparin (LOVENOX) injection  40 mg Subcutaneous Q24H  . insulin aspart  0-15 Units Subcutaneous TID WC  . insulin aspart  0-5 Units Subcutaneous QHS  . multivitamin with minerals  1 tablet Oral Daily  . potassium chloride  40 mEq Oral  Once  . sodium chloride flush  3 mL Intravenous Q12H   Continuous Infusions: . acyclovir    . thiamine injection 250 mg (10/22/19 0957)     LOS: 4 days   Monica Bauer  [facility]   How to contact the [facility] Attending or Consulting provider [place] or covering provider during after hours [place], for this patient?  1. Check the care team in [facility] and look for a) attending/consulting TRH provider listed and b) the [facility] team listed 2. Log into [email or web address] and use [facility]'s universal password to access. If you do not have the password, please contact the hospital operator. 3. Locate the [facility] provider you are looking for under [facility] and page to a number that you can be directly reached. 4. If you still have difficulty reaching the provider, please page the [facility] (Director on Call) for the Hospitalists listed on amion for assistance.  10/22/2019, 2:27 PM

## 2019-10-22 NOTE — Progress Notes (Addendum)
Reason for consult: Transient alteration of awareness  Subjective: No acute events overnight.  Denies any further seizure-like episodes or episodes of alteration of awareness.  Patient does report that she does not sleep much.  States she was diagnosed with sleep apnea sometime ago and was on "machine" but does not know more about it.  Her son at bedside states she worries too much about her grandmother at home and therefore does not get enough sleep.   ROS: negative except above Examination  Vital signs in last 24 hours: Temp:  [98 F (36.7 C)-98.6 F (37 C)] 98.3 F (36.8 C) (11/24 1157) Pulse Rate:  [71-89] 85 (11/24 1157) Resp:  [15-18] 18 (11/24 1157) BP: (139-163)/(86-92) 152/92 (11/24 1157) SpO2:  [97 %-100 %] 100 % (11/24 1157) Weight:  [79.9 kg] 79.9 kg (11/24 0520)  General: lying in bed, not in apparent distress CVS: pulse-normal rate and rhythm RS: breathing comfortably Extremities: normal   Neuro: MS: Alert, oriented to time place person, follows commands CN: pupils equal and reactive,  EOMI, face symmetric, tongue midline, normal sensation over face, Motor: 5/5 strength in all 4 extremities Coordination: normal Gait: not tested  Basic Metabolic Panel: Recent Labs  Lab 10/17/19 1657 10/18/19 0404 10/19/19 0437 10/20/19 0521 10/22/19 0403  NA 139 144 145 142 141  K 3.7 3.4* 3.5 3.2* 3.2*  CL 107 106 107 107 106  CO2 21* 21* 22 24 23   GLUCOSE 136* 94 130* 186* 93  BUN 25* 21 17 16 12   CREATININE 1.14* 1.07* 1.60* 1.36* 1.01*  CALCIUM 9.8 9.5 9.8 9.2 9.3    CBC: Recent Labs  Lab 10/17/19 1657 10/18/19 0404 10/19/19 0437 10/20/19 0521 10/22/19 0403  WBC 17.6* 13.2* 14.2* 12.4* 11.6*  NEUTROABS 12.5* 7.7  --   --   --   HGB 12.7 12.4 13.3 12.2 12.3  HCT 38.6 41.1 39.5 36.9 36.3  MCV 85.8 94.5 84.9 86.0 84.6  PLT 299 245 272 243 264     Coagulation Studies: No results for input(s): LABPROT, INR in the last 72 hours.  Imaging MRI brain  10/17/2019: No acute abnormality.    ASSESSMENT AND PLAN 82 year oldfemalewith history of diabetes,hypertension and prior episode of altered mental status which was presumed to be transient global amnesia in 2015, presents for episodes of altered mentation where she appears confused and has speech difficulty-predominantly garbled and slurred speech. In between the spells she appears to be normal. No history of seizures or bowel bladder incontinence to suggest any seizure activity.Todayhad anotherepisodethatincluded lip smacking,and abnormal hand gestures.   Transient alteration of awareness -LTM EEG for 48 hours did not show any ictal-interictal abnormality.  Photic stimulation and hyperventilation also did not elicit any interictal-ictal activity. - Ammonia was mildly elevated at 39, thiamine level pending s/p 3 days of IV thiamine -Patient was noted to sleep only briefly during 48 hours of EEG.  She also reports being diagnosed with sleep apnea in past. -Differentials at this point include cognitive abnormalities versus sleep related disorder versus very less likely seizure  Recommendations -As patient does not have any abnormality on EEG, her MRI brain did not show any acute abnormality and her episodes of alteration of awareness are not clearly consistent with epilepsy, we will defer starting antiepileptics at this point.  -Patient on IV acyclovir for suspected HSV meningitis/encephalitis.  Of note this was never confirmed as patient was not able to obtain lumbar puncture even under fluoroscopy guidance and MRI brain was done without contrast.  Consider ID consult regarding duration of acyclovir. -Seizure precautions including do not drive for 6 months per [facility] law were discussed -Recommend follow-up with [facility] neurology for outpatient work-up in regards to dementia, sleep disorder.   I have spent a total of  35 minuteswith the patient reviewing  hospitalnotes,  test results, labs and examining the patient as well as establishing an assessment and plan that was discussed personally with the patient and son at bedside.>50% of time was spent in direct patient care.

## 2019-10-23 LAB — CBC
HCT: 38.4 % (ref 36.0–46.0)
Hemoglobin: 12.8 g/dL (ref 12.0–15.0)
MCH: 28.4 pg (ref 26.0–34.0)
MCHC: 33.3 g/dL (ref 30.0–36.0)
MCV: 85.1 fL (ref 80.0–100.0)
Platelets: 250 10*3/uL (ref 150–400)
RBC: 4.51 MIL/uL (ref 3.87–5.11)
RDW: 14 % (ref 11.5–15.5)
WBC: 10.9 10*3/uL — ABNORMAL HIGH (ref 4.0–10.5)
nRBC: 0 % (ref 0.0–0.2)

## 2019-10-23 LAB — BASIC METABOLIC PANEL
Anion gap: 12 (ref 5–15)
BUN: 13 mg/dL (ref 8–23)
CO2: 22 mmol/L (ref 22–32)
Calcium: 9.4 mg/dL (ref 8.9–10.3)
Chloride: 108 mmol/L (ref 98–111)
Creatinine, Ser: 1.08 mg/dL — ABNORMAL HIGH (ref 0.44–1.00)
GFR calc Af Amer: >60 mL/min (ref >60)
GFR calc non Af Amer: 55 mL/min — ABNORMAL LOW (ref >60)
Glucose, Bld: 104 mg/dL — ABNORMAL HIGH (ref 70–99)
Potassium: 3.7 mmol/L (ref 3.5–5.1)
Sodium: 142 mmol/L (ref 135–145)

## 2019-10-23 LAB — GLUCOSE, CAPILLARY
Glucose-Capillary: 97 mg/dL (ref 70–99)
Glucose-Capillary: 143 mg/dL — ABNORMAL HIGH (ref 70–99)
Glucose-Capillary: 112 mg/dL — ABNORMAL HIGH (ref 70–99)
Glucose-Capillary: 114 mg/dL — ABNORMAL HIGH (ref 70–99)

## 2019-10-23 MED ORDER — AMLODIPINE BESYLATE 5 MG PO TABS
5.0000 mg | ORAL_TABLET | Freq: Every day | ORAL | Status: DC
  Administered 2019-10-23 – 2019-10-28 (×6): 5 mg via ORAL
  Filled 2019-10-23 (×6): qty 1

## 2019-10-23 MED ORDER — LEVETIRACETAM 500 MG PO TABS: 500.0000 mg | ORAL_TABLET | Freq: Two times a day (BID) | ORAL | Status: DC

## 2019-10-23 MED ORDER — LEVETIRACETAM 250 MG PO TABS
250.0000 mg | ORAL_TABLET | Freq: Two times a day (BID) | ORAL | Status: DC
  Administered 2019-10-23 – 2019-10-28 (×11): 250 mg via ORAL
  Filled 2019-10-23 (×12): qty 1

## 2019-10-23 NOTE — Progress Notes (Signed)
[facility] PROGRESS NOTE  Monica Bauer ASN:[id number] DOB: [birth year] DOA: 10/17/2019 PCP: Thornton Dales I, MD  Assessment/Plan:  Acute encephalopathy- ? Seizures vs worsening of dementia -Presented 11/19with 3-4 days of waxing and waning confusion, not caring for self or her 63 yo mother with whom she resides -No acute findings noted on head CT or MRI brain. -chart review: 12/14 seen at [facility]- had MRI for memory issues 10/15 was at [facility]- seen by ER doc-- diagnosed with TGA -Neurologyevaluated: broad differential diagnosis-- seizures vs infection vs worsening dementia-- LP unsuccessful at bedside and with IR:  empiric acyclovirfor 7 days (11/26)-- no clear evidence of HSV meningitis-- no EEG changes no doubt this diagnosis and hence why not treating for 21 days -B1 level 121.1- will discontinue empiric treatment today -RPR and UDS- negative  -LTM EEG: no seizures but patient did not sleep much, patient told neurology that she has OSA but did not like the CPAP so she does not use -slow to "wake up" per RN. Alert oriented to self and place this am.   Elevated B12 -? Etiology -does not have B12 on home meds so not convinced of over-replacement  H/o breast cancer 2013 -. Malignant neoplasm of upper-outer quadrant of right female breast, unspecified estrogen receptor status (CMS-HCC)  DIAGNOSIS: Right LQ 10/2012 Stage 1 T1 c No,breast cancer invasive, HER-2 negative, ER positive 95%,PR 3-5 % node negative. Recurrence score 2, recurrence rate 4%. received radiation and arimidex 01/2013  Continue arimidex per last progress note: 11/2018 by oncology- [facility]  Type II DM -A1c 6.7% - continue to hold oral agents -SSI for optimal control -carb mod diet  AKI. Creatinine stable at 1.08.  -hold nephrotoxins -strict I/Os  HypertensionBP high end of normal. Home meds include amlodipine, HCTZ losartan, hydralazine.  -resume amlodipine at lower dose and monitor.  -resume  remaining home meds as indicated  Hypokalemia -replaced and resolved  obesity Body mass index is 32.22 kg/m.  Code Status: full Family Communication: VM for son Disposition Plan: home hopefully tomorrow   Consultants:  [facility] neuro  Procedures:    Antibiotics:  Acyclovir day #6  HPI/Subjective: Awake alert denies pain/discomfort.   Objective: Vitals:   10/23/19 0016 10/23/19 0641  BP: (!) 152/83 (!) 150/92  Pulse: 71 77  Resp: 17 15  Temp: 98.2 F (36.8 C) 98.1 F (36.7 C)  SpO2: 95% 99%    Intake/Output Summary (Last 24 hours) at 10/23/2019 1937 Last data filed at 10/22/2019 1807 Gross per 24 hour  Intake 312 ml  Output 400 ml  Net -88 ml   Filed Weights   10/17/19 1655 10/22/19 0520  Weight: 90.3 kg 79.9 kg    Exam:   General:  Awake alert no acute distress  Cardiovascular: rrr no mgr no LE edema  Respiratory: normal effort BS clear bilaterally no wheeze  Abdomen: non-distended non-tender +BS no guarding or rebounding  Musculoskeletal: joints without swelling/erythema  Neuro: alert oriented to self and place. Facial symmetry, speech clear but very soft moving all extremities   Data Reviewed: Basic Metabolic Panel: Recent Labs  Lab 10/18/19 0404 10/19/19 0437 10/20/19 0521 10/22/19 0403 10/23/19 0508  NA 144 145 142 141 142  K 3.4* 3.5 3.2* 3.2* 3.7  CL 106 107 107 106 108  CO2 21* '22 24 23 22  ' GLUCOSE 94 130* 186* 93 104*  BUN '21 17 16 12 13  ' CREATININE 1.07* 1.60* 1.36* 1.01* 1.08*  CALCIUM 9.5 9.8 9.2 9.3 9.4   Liver Function  Tests: Recent Labs  Lab 10/17/19 1657  AST 40  ALT 31  ALKPHOS 56  BILITOT 1.2  PROT 8.7*  ALBUMIN 4.5   No results for input(s): LIPASE, AMYLASE in the last 168 hours. Recent Labs  Lab 10/18/19 0116  AMMONIA 39*   CBC: Recent Labs  Lab 10/17/19 1657 10/18/19 0404 10/19/19 0437 10/20/19 0521 10/22/19 0403 10/23/19 0508  WBC 17.6* 13.2* 14.2* 12.4* 11.6* 10.9*  NEUTROABS 12.5*  7.7  --   --   --   --   HGB 12.7 12.4 13.3 12.2 12.3 12.8  HCT 38.6 41.1 39.5 36.9 36.3 38.4  MCV 85.8 94.5 84.9 86.0 84.6 85.1  PLT 299 245 272 243 264 250   Cardiac Enzymes: Recent Labs  Lab 10/17/19 1657 10/18/19 0404  CKTOTAL 808* 680*   BNP (last 3 results) No results for input(s): BNP in the last 8760 hours.  ProBNP (last 3 results) No results for input(s): PROBNP in the last 8760 hours.  CBG: Recent Labs  Lab 10/22/19 0523 10/22/19 1136 10/22/19 1627 10/22/19 2047 10/23/19 0634  GLUCAP 120* 114* 164* 82 97    Recent Results (from the past 240 hour(s))  SARS CORONAVIRUS 2 (TAT 6-24 HRS) Nasopharyngeal Nasopharyngeal Swab     Status: None   Collection Time: 10/17/19  6:35 PM   Specimen: Nasopharyngeal Swab  Result Value Ref Range Status   SARS Coronavirus 2 NEGATIVE NEGATIVE Final    Comment: (NOTE) SARS-CoV-2 target nucleic acids are NOT DETECTED. The SARS-CoV-2 RNA is generally detectable in upper and lower respiratory specimens during the acute phase of infection. Negative results do not preclude SARS-CoV-2 infection, do not rule out co-infections with other pathogens, and should not be used as the sole basis for treatment or other patient management decisions. Negative results must be combined with clinical observations, patient history, and epidemiological information. The expected result is Negative. Fact Sheet for Patients: SugarRoll.be Fact Sheet for Healthcare Providers: https://www.woods-mathews.com/ This test is not yet approved or cleared by the [place] FDA and  has been authorized for detection and/or diagnosis of SARS-CoV-2 by FDA under an Emergency Use Authorization (EUA). This EUA will remain  in effect (meaning this test can be used) for the duration of the COVID-19 declaration under Section 56 4(b)(1) of the Act, 21 U.S.C. section 360bbb-3(b)(1), unless the authorization is terminated  or revoked sooner. Performed at [facility], [place] [address], [place], [place] [address]      Studies: No results found.  Scheduled Meds: . atorvastatin  10 mg Oral QPM  . enoxaparin (LOVENOX) injection  40 mg Subcutaneous Q24H  . insulin aspart  0-15 Units Subcutaneous TID WC  . insulin aspart  0-5 Units Subcutaneous QHS  . multivitamin with minerals  1 tablet Oral Daily  . sodium chloride flush  3 mL Intravenous Q12H   Continuous Infusions: . acyclovir 620 mg (10/23/19 0659)  . thiamine injection 250 mg (10/22/19 2124)    Principal Problem:   Acute encephalopathy Active Problems:   Hypertension   Mild renal insufficiency   Elevated vitamin B12 level   Diabetes mellitus without complication (HCC)   Breast cancer (HCC)   AMS (altered mental status)    Time spent: 82 minutes    [place] NP  [facility]  If 7PM-7AM, please contact night-coverage at [email or web address], password [facility] 10/23/2019, 9:24 AM  LOS: 5 days

## 2019-10-23 NOTE — Progress Notes (Signed)
Pt woke up and appeared confused. Ptt appeared drowsy but alert and did not answer nurse when attempting to ask orientation questions.  Client was also soiled with urine. After one minute client began talking. Pt stated she felt like she was still asleep. Client was able to get out of bed and ambulate to the bathroom. She answered all orientation questions appropriately. She was also able to give herself a bath with minimal assistance. Bed Linen changed. Client placed in clean bed and began eating her breakfast.

## 2019-10-23 NOTE — Progress Notes (Signed)
Patient trasfered from [facility] to [phone] via wheelchair; alert and oriented x 4; no complaints of pain; IV saline locked in LFA; skin intact. Orient patient to room and unit; gave patient care guide; instructed how to use the call bell and  fall risk precautions. Will continue to monitor the patient.

## 2019-10-23 NOTE — Progress Notes (Addendum)
Reason for consult: Transient alteration of awareness  Subjective: Per RN documentation, patient had another episode of alteration of awareness when she woke up from sleep this morning.  She was also noted to have urinary incontinence.  On talking to patient, she denies previous episodes of urinary incontinence and does not have any memory of the episode this morning.   ROS: negative except above Examination  Vital signs in last 24 hours: Temp:  [98.1 F (36.7 C)-99.1 F (37.3 C)] 99.1 F (37.3 C) (11/25 1218) Pulse Rate:  [71-87] 87 (11/25 1218) Resp:  [15-18] 18 (11/25 1218) BP: (136-162)/(74-99) 142/99 (11/25 1218) SpO2:  [95 %-100 %] 96 % (11/25 1218)  General: lying in bed,not in apparent distress CVS: pulse-normal rate and rhythm RS: breathing comfortably Extremities: normal   Neuro: MS: Alert, orientedto time place person, follows commands CN: pupils equal and reactive, EOMI, face symmetric, tongue midline, normal sensation over face, Motor: 5/5 strength in all 4 extremities Coordination: normal Gait: not tested  Basic Metabolic Panel: Recent Labs  Lab 10/18/19 0404 10/19/19 0437 10/20/19 0521 10/22/19 0403 10/23/19 0508  NA 144 145 142 141 142  K 3.4* 3.5 3.2* 3.2* 3.7  CL 106 107 107 106 108  CO2 21* 22 24 23 22   GLUCOSE 94 130* 186* 93 104*  BUN 21 17 16 12 13   CREATININE 1.07* 1.60* 1.36* 1.01* 1.08*  CALCIUM 9.5 9.8 9.2 9.3 9.4    CBC: Recent Labs  Lab 10/17/19 1657 10/18/19 0404 10/19/19 0437 10/20/19 0521 10/22/19 0403 10/23/19 0508  WBC 17.6* 13.2* 14.2* 12.4* 11.6* 10.9*  NEUTROABS 12.5* 7.7  --   --   --   --   HGB 12.7 12.4 13.3 12.2 12.3 12.8  HCT 38.6 41.1 39.5 36.9 36.3 38.4  MCV 85.8 94.5 84.9 86.0 84.6 85.1  PLT 299 245 272 243 264 250     Coagulation Studies: No results for input(s): LABPROT, INR in the last 72 hours.  Imaging MRI brain 10/17/2019: No acute abnormality.    ASSESSMENT AND  PLAN 15 year oldfemalewith history of diabetes,hypertension and prior episode of altered mental status which was presumed to be transient global amnesia in 2015, presents for episodes of altered mentation where she appears confused and has speech difficulty-predominantly garbled and slurred speech. In between the spells she appears to be normal.   Transient alteration of awareness -Patient had 2 more episodes of alteration of awareness with speech disturbance during the hospitalization.  Unfortunately none of those episodes were captured on EEG -LTM EEG for 48 hours did not show any ictal-interictal abnormality.  Photic stimulation and hyperventilation also did not elicit any interictal-ictal activity. - Ammonia was mildly elevated at 39, thiamine level pending s/p 3 days of IV thiamine -Patient was noted to sleep only briefly during 48 hours of EEG.  She also reports being diagnosed with sleep apnea in past. -Differentials at this point include cognitive abnormalities versus sleep related disorder versus very less likely seizure  Recommendations --Both these episodes happen immediately after waking up from sleep and therefore I still cannot rule out sleep-related disorder.  However, as patient has had more than 2 episodes of alteration of awareness without clear etiology and atleast one with urinary incontinence, will start Keppra 250 mg twice daily with plan to increase to 500 mg twice daily in 1 week -Patient on IV acyclovir for suspected HSV meningitis/encephalitis.  Of note this was never confirmed as patient was not able to obtain lumbar puncture even under fluoroscopy  guidance and MRI brain was done without contrast.  Consider ID consult regarding duration of acyclovir. -Seizure precautions including do not drive for 6 months per [facility] law were discussed -Recommend follow-up with [facility] neurology for outpatient work-up in regards to dementia, sleep disorder, possible  epilepsy -If these episodes persist after initiation of Keppra and rest of outpatient work-up, will consider EMU admission for further characterization of spells.   I have spent a total of54minuteswith the patient reviewing hospitalnotes, test results, labs and examining the patient as well as establishing an assessment and plan that was discussed personally with the patient.>50% of time was spent in direct patient care.

## 2019-10-24 LAB — GLUCOSE, CAPILLARY
Glucose-Capillary: 109 mg/dL — ABNORMAL HIGH (ref 70–99)
Glucose-Capillary: 179 mg/dL — ABNORMAL HIGH (ref 70–99)
Glucose-Capillary: 96 mg/dL (ref 70–99)
Glucose-Capillary: 106 mg/dL — ABNORMAL HIGH (ref 70–99)

## 2019-10-24 NOTE — Progress Notes (Addendum)
Reason for consult: Transient alteration of awareness  Subjective: No acute events overnight.  Patient denies any side effects from [place].  ROS: negative except above  Examination  Vital signs in last 24 hours: Temp:  [98.5 F (36.9 C)-99.4 F (37.4 C)] 98.7 F (37.1 C) (11/26 1336) Pulse Rate:  [70-93] 77 (11/26 1336) Resp:  [16-18] 18 (11/26 1336) BP: (127-143)/(69-90) 140/77 (11/26 1336) SpO2:  [98 %-100 %] 99 % (11/26 1336)  General: lying in bed,not in apparent distress CVS: pulse-normal rate and rhythm RS: breathing comfortably Extremities: normal   Neuro: MS: Alert, orientedto time place person, follows commands CN: pupils equal and reactive, EOMI, face symmetric, tongue midline, normal sensation over face, Motor: 5/5 strength in all 4 extremities Coordination: normal Gait: not tested  Basic Metabolic Panel: Recent Labs  Lab 10/18/19 0404 10/19/19 0437 10/20/19 0521 10/22/19 0403 10/23/19 0508  NA 144 145 142 141 142  K 3.4* 3.5 3.2* 3.2* 3.7  CL 106 107 107 106 108  CO2 21* 22 24 23 22   GLUCOSE 94 130* 186* 93 104*  BUN 21 17 16 12 13   CREATININE 1.07* 1.60* 1.36* 1.01* 1.08*  CALCIUM 9.5 9.8 9.2 9.3 9.4    CBC: Recent Labs  Lab 10/17/19 1657 10/18/19 0404 10/19/19 0437 10/20/19 0521 10/22/19 0403 10/23/19 0508  WBC 17.6* 13.2* 14.2* 12.4* 11.6* 10.9*  NEUTROABS 12.5* 7.7  --   --   --   --   HGB 12.7 12.4 13.3 12.2 12.3 12.8  HCT 38.6 41.1 39.5 36.9 36.3 38.4  MCV 85.8 94.5 84.9 86.0 84.6 85.1  PLT 299 245 272 243 264 250     Coagulation Studies: No results for input(s): LABPROT, INR in the last 72 hours.  Imaging MRI brain 10/17/2019: No acute abnormality.   ASSESSMENT AND PLAN 46 year oldfemalewith history of diabetes,hypertension and prior episode of altered mental status which was presumed to be transient global amnesia in 2015, presents for episodes of altered mentation where she appears confused and has speech  difficulty-predominantly garbled and slurred speech. In between the spells she appears to be normal.   Transient alteration of awareness -Patient had 2 more episodes of alteration of awareness with speech disturbance during the hospitalization.  Unfortunately none of those episodes were captured on EEG -LTM EEG for 48 hours did not show any ictal-interictal abnormality. Photic stimulation and hyperventilation also did not elicit any interictal-ictal activity. - Patient was noted to sleep only briefly during 48 hours of EEG. She also reports being diagnosed with sleep apnea in past. -Differentials at this point include cognitive abnormalities versus sleep related disorder versus very less likely seizure  Recommendations --Both these episodes happen immediately after waking up from sleep and therefore I still cannot rule out sleep-related disorder.  However, as patient has had more than 2 episodes of alteration of awareness without clear etiology and atleast one with urinary incontinence, will start Keppra 250 mg twice daily with plan to increase to 500 mg twice daily in 1 week -Patient on IV acyclovir for suspected HSV meningitis/encephalitis. Of note this was never confirmed as patient was notable to obtain lumbar puncture even under fluoroscopy guidance and MRI brain was done without contrast.  -Seizure precautionsincluding do not drive for 6 months per [facility] law were discussed -Recommend follow-up with [facility] neurology for outpatient work-up in regards to dementia, sleep disorder, possible epilepsy -If these episodes persist after initiation of Keppra and rest of outpatient work-up, will consider EMU admission for further  characterization of spells.   I have spent a total of75minuteswith the patient reviewing hospitalnotes, test results, labs and examining the patient as well as establishing an assessment and plan that was discussed personally with the  patient.>50% of time was spent in direct patient care.

## 2019-10-24 NOTE — Progress Notes (Addendum)
Progress Note    Monica Bauer  [id number] DOB: 1956/03/19  DOA: 10/17/2019 PCP: Thornton Dales I, MD    Brief Narrative:     Medical records reviewed and are as summarized below:  Monica Bauer is an 63 y.o. female with medical history significant for hypertension, breast cancer, and type 2 diabetes mellitus, presenting to the emergency department for evaluation of confusion.  Patient was brought in to the ED by her son who has been concerned for waxing and waning confusion over the past 3 to 4 days.  Patient seemed to be in her usual state of health until she seemed to be slightly confused on 10/13/2019.  She has seemed to be lucid at times, but overall seems to be worsening, not caring for herself, and calling family in the middle of the night with marked confusion and garbled speech.  Patient is normally alert and fully oriented, manages all of her ADLs, and also cares for her elderly mother.   Reviewed chart further: 12/14 seen at [facility]- had MRI for memory issues 10/15 was at [facility]- seen by ER doc-- diagnosed with TGA  We are treating as seizures as she had several "spells" in the hospital.  None captured on EEG so dementia/sleep disorder is on the differential.  Initially thought to be HSV meningitis but LP was not tolerated.  Plan to treat for 7 days with acyclovir.   Assessment/Plan:   Principal Problem:   Acute encephalopathy Active Problems:   Hypertension   Diabetes mellitus without complication (HCC)   Mild renal insufficiency   Breast cancer (HCC)   Elevated vitamin B12 level   AMS (altered mental status)    Acute encephalopathy-  Seizures vs worsening of dementia -Presented 11/19 with 3-4 days of waxing and waning confusion, not caring for self or her 87 yo mother with whom she resides -No acute findings noted on head CT or MRI brain. -chart review: 12/14 seen at [facility]- had MRI for memory issues 10/15 was at [facility]- seen by ER doc-- diagnosed with TGA  -Neurology evaluated: broad differential diagnosis-- seizures vs infection vs worsening dementia-- LP unsuccessful at bedside and with IR:  empiric acyclovirfor 7 days (11/27 last day)--spoke with ID;  no clear evidence of HSV meningitis-- no EEG changes no doubt this diagnosis and hence why not treating for 21 days -B1 normal -RPR and UDS- negative  -LTM EEG: no seizures but patient did not sleep much, patient told neurology that she has OSA but did not like the CPAP so she does not use -has had several seizure like episodes in the hospital but we were not able to catch on EEG -discussed with neurology and plan is to start keppra 250 bid and increase to 582m -will need seizure precautions and outpatient follow up  Elevated B12 -? Etiology -does not have B12 on home meds  -outpatient follow up  H/o breast cancer 2013 -. Malignant neoplasm of upper-outer quadrant of right female breast, unspecified estrogen receptor status (CMS-HCC)  DIAGNOSIS: Right LQ 10/2012 Stage 1 T1 c No,breast cancer invasive, HER-2 negative, ER positive 95%,PR 3-5 % node negative. Recurrence score 2, recurrence rate 4%. received radiation and arimidex 01/2013  Continue arimidex per last progress note: [facility] -A1c 6.7%   -holding oral agents -SSI for optimal control -carb mod diet  AKI -IVF hydration once IV replaced -strict I/Os  Hypertension -resume home meds as able  Hypokalemia -replaced  obesity Body mass index  is 32.22 kg/m.   Family Communication/Anticipated D/C date and plan/Code Status   DVT prophylaxis: Lovenox ordered. Code Status: Full Code.  Family Communication: spoke with son at bedside 11/14 Disposition Plan: 7 days of IV acyclovir   Medical Consultants:    Neurology  Subjective:   No episodes this AM   Objective:    Vitals:   10/23/19 1610 10/23/19 2110 10/24/19 0514 10/24/19 0800  BP: 137/90 129/73 127/69 (!) 143/71   Pulse: 93 83 70 74  Resp: 16   17  Temp: 99.4 F (37.4 C) 99 F (37.2 C) 98.5 F (36.9 C) 98.9 F (37.2 C)  TempSrc: Oral  Oral Oral  SpO2: 100% 99% 98% 98%  Weight:      Height:        Intake/Output Summary (Last 24 hours) at 10/24/2019 1135 Last data filed at 10/24/2019 0825 Gross per 24 hour  Intake 723 ml  Output 700 ml  Net 23 ml   Filed Weights   10/17/19 1655 10/22/19 0520  Weight: 90.3 kg 79.9 kg    Exam: In bed, resting Speech clear and fluent rrr No increased work of breathing   Data Reviewed:   I have personally reviewed following labs and imaging studies:  Labs: Labs show the following:   Basic Metabolic Panel: Recent Labs  Lab 10/18/19 0404 10/19/19 0437 10/20/19 0521 10/22/19 0403 10/23/19 0508  NA 144 145 142 141 142  K 3.4* 3.5 3.2* 3.2* 3.7  CL 106 107 107 106 108  CO2 21* _0 GLUCOSE 94 130* 186* 93 104*  BUN _1 CREATININE 1.07* 1.60* 1.36* 1.01* 1.08*  CALCIUM 9.5 9.8 9.2 9.3 9.4   GFR Estimated Creatinine Clearance: 52.2 mL/min (A) (by C-G formula based on SCr of 1.08 mg/dL (H)). Liver Function Tests: Recent Labs  Lab 10/17/19 1657  AST 40  ALT 31  ALKPHOS 56  BILITOT 1.2  PROT 8.7*  ALBUMIN 4.5   No results for input(s): LIPASE, AMYLASE in the last 168 hours. Recent Labs  Lab 10/18/19 0116  AMMONIA 39*   Coagulation profile No results for input(s): INR, PROTIME in the last 168 hours.  CBC: Recent Labs  Lab 10/17/19 1657 10/18/19 0404 10/19/19 0437 10/20/19 0521 10/22/19 0403 10/23/19 0508  WBC 17.6* 13.2* 14.2* 12.4* 11.6* 10.9*  NEUTROABS 12.5* 7.7  --   --   --   --   HGB 12.7 12.4 13.3 12.2 12.3 12.8  HCT 38.6 41.1 39.5 36.9 36.3 38.4  MCV 85.8 94.5 84.9 86.0 84.6 85.1  PLT 299 245 272 243 264 250   Cardiac Enzymes: Recent Labs  Lab 10/17/19 1657 10/18/19 0404  CKTOTAL 808* 680*   BNP (last 3 results) No results for input(s): PROBNP in the last 8760 hours. CBG: Recent  Labs  Lab 10/23/19 0634 10/23/19 1105 10/23/19 1730 10/23/19 2114 10/24/19 0756  GLUCAP 97 143* 112* 114* 109*   D-Dimer: No results for input(s): DDIMER in the last 72 hours. Hgb A1c: No results for input(s): HGBA1C in the last 72 hours. Lipid Profile: No results for input(s): CHOL, HDL, LDLCALC, TRIG, CHOLHDL, LDLDIRECT in the last 72 hours. Thyroid function studies: No results for input(s): TSH, T4TOTAL, T3FREE, THYROIDAB in the last 72 hours.  Invalid input(s): FREET3 Anemia work up: No results for input(s): VITAMINB12, FOLATE, FERRITIN, TIBC, IRON, RETICCTPCT in the last 72 hours. Sepsis Labs: Recent Labs  Lab 10/19/19 0437 10/20/19 0521 10/22/19 0403 10/23/19  0508  WBC 14.2* 12.4* 11.6* 10.9*    Microbiology Recent Results (from the past 240 hour(s))  SARS CORONAVIRUS 2 (TAT 6-24 HRS) Nasopharyngeal Nasopharyngeal Swab     Status: None   Collection Time: 10/17/19  6:35 PM   Specimen: Nasopharyngeal Swab  Result Value Ref Range Status   SARS Coronavirus 2 NEGATIVE NEGATIVE Final    Comment: (NOTE) SARS-CoV-2 target nucleic acids are NOT DETECTED. The SARS-CoV-2 RNA is generally detectable in upper and lower respiratory specimens during the acute phase of infection. Negative results do not preclude SARS-CoV-2 infection, do not rule out co-infections with other pathogens, and should not be used as the sole basis for treatment or other patient management decisions. Negative results must be combined with clinical observations, patient history, and epidemiological information. The expected result is Negative. Fact Sheet for Patients: SugarRoll.be Fact Sheet for Healthcare Providers: https://www.woods-mathews.com/ This test is not yet approved or cleared by the [place] FDA and  has been authorized for detection and/or diagnosis of SARS-CoV-2 by FDA under an Emergency Use Authorization (EUA). This EUA will remain  in  effect (meaning this test can be used) for the duration of the COVID-19 declaration under Section 56 4(b)(1) of the Act, 21 U.S.C. section 360bbb-3(b)(1), unless the authorization is terminated or revoked sooner. Performed at [facility], [place] [address] [place], [place] [address]     Procedures and diagnostic studies:  No results found.  Medications:   . amLODipine  5 mg Oral Daily  . atorvastatin  10 mg Oral QPM  . enoxaparin (LOVENOX) injection  40 mg Subcutaneous Q24H  . insulin aspart  0-15 Units Subcutaneous TID WC  . insulin aspart  0-5 Units Subcutaneous QHS  . levETIRAcetam  250 mg Oral BID   Followed by  . [START ON 10/30/2019] levETIRAcetam  500 mg Oral BID  . multivitamin with minerals  1 tablet Oral Daily  . sodium chloride flush  3 mL Intravenous Q12H   Continuous Infusions: . acyclovir 620 mg (10/24/19 0558)     LOS: 6 days   Geradine Girt  [facility]   How to contact the [facility] Attending or Consulting provider [place] or covering provider during after hours [place], for this patient?  1. Check the care team in [facility] and look for a) attending/consulting [facility] provider listed and b) the [facility] team listed 2. Log into [email or web address] and use [place]'s universal password to access. If you do not have the password, please contact the hospital operator. 3. Locate the [facility] provider you are looking for under [facility] and page to a number that you can be directly reached. 4. If you still have difficulty reaching the provider, please page the [facility] (Director on Call) for the Hospitalists listed on amion for assistance.  10/24/2019, 11:35 AM

## 2019-10-25 ENCOUNTER — Encounter (HOSPITAL_COMMUNITY): Payer: Self-pay | Admitting: Internal Medicine

## 2019-10-25 DIAGNOSIS — R748 Abnormal levels of other serum enzymes: Secondary | ICD-10-CM

## 2019-10-25 LAB — CREATININE, SERUM
Creatinine, Ser: 1.03 mg/dL — ABNORMAL HIGH (ref 0.44–1.00)
GFR calc Af Amer: >60 mL/min (ref >60)
GFR calc non Af Amer: 58 mL/min — ABNORMAL LOW (ref >60)

## 2019-10-25 LAB — GLUCOSE, CAPILLARY
Glucose-Capillary: 93 mg/dL (ref 70–99)
Glucose-Capillary: 94 mg/dL (ref 70–99)
Glucose-Capillary: 96 mg/dL (ref 70–99)
Glucose-Capillary: 115 mg/dL — ABNORMAL HIGH (ref 70–99)
Glucose-Capillary: 99 mg/dL (ref 70–99)
Glucose-Capillary: 95 mg/dL (ref 70–99)

## 2019-10-25 NOTE — Plan of Care (Signed)
Progressing well on current medication regimen. Remain free of infection.

## 2019-10-25 NOTE — Progress Notes (Signed)
   10/24/19 2111  MEWS Score  Resp 20  Pulse Rate 74  BP (!) 143/81  Temp 98.1 F (36.7 C)  SpO2 100 %  O2 Device Room Air  MEWS Score  MEWS RR 0  MEWS Pulse 0  MEWS Systolic 0  MEWS LOC 0  MEWS Temp 0  MEWS Score 0  MEWS Score Color Green  MEWS Assessment  Is this an acute change? No   Lab Results WBC  Date/Time Value Ref Range Status  10/23/2019 05:08 AM 10.9 (H) 4.0 - 10.5 K/uL Final  10/22/2019 04:03 AM 11.6 (H) 4.0 - 10.5 K/uL Final  10/20/2019 05:21 AM 12.4 (H) 4.0 - 10.5 K/uL Final   Neutrophils Relative %  Date/Time Value Ref Range Status  10/18/2019 04:04 AM 58 % Final  10/17/2019 04:57 PM 71 % Final  11/21/2014 01:00 PM 67 43 - 77 % Final   No results found for: PCO2ART No results found for: LATICACIDVEN No results found for: PCO2VEN

## 2019-10-25 NOTE — Evaluation (Signed)
Physical Therapy Evaluation Patient Details Name: Monica Bauer MRN: [id number] DOB: 1956-08-06 Today's Date: 10/25/2019   History of Present Illness  Pt is62 y.o. female with medical history significant for hypertension, breast cancer, and type 2 diabetes mellitus, obesity, was brought to the hospital because of intermittent episodes of confusion.  Exact etiology has not been identified has been started on Keppra for suspected seizures and IV acyclovir for suspected meningitis/encephalitis.  MRI head showed no acute changes; CTA neck/head: no significant stenosis; unsuccessful LP.  Clinical Impression  Pt admitted with above diagnosis. Pt demonstrated WFL strength and ROM throughout.  She demonstrated safe gait, dynamic gait and transfers.  Pt feels that she is at her baseline.  No deficits noted with PT.  No PT needs - will sign off.  Please re-consult if further need occurs.      Follow Up Recommendations No PT follow up    Equipment Recommendations  None recommended by PT    Recommendations for Other Services       Precautions / Restrictions Precautions Precautions: Fall      Mobility  Bed Mobility Overal bed mobility: Independent             General bed mobility comments: At arrival, pt walking out of bathroom independently  Transfers Overall transfer level: Needs assistance Equipment used: None Transfers: Sit to/from Stand Sit to Stand: Independent            Ambulation/Gait Ambulation/Gait assistance: Supervision Gait Distance (Feet): 400 Feet Assistive device: None Gait Pattern/deviations: WFL(Within Functional Limits)     General Gait Details: demonstrates gait at normal speed without deficits; feels she is at baseline  Stairs Stairs: (simiulated stairs with marching in hall with min guard)          Wheelchair Mobility    Modified Rankin (Stroke Patients Only)       Balance Overall balance assessment: Needs assistance Sitting-balance  support: No upper extremity supported;Feet unsupported Sitting balance-Leahy Scale: Normal     Standing balance support: No upper extremity supported Standing balance-Leahy Scale: Normal               High level balance activites: Side stepping;Direction changes;Turns;Sudden stops;Head turns High Level Balance Comments: all without LOB; pt also took 4 steps near tandem without LOB Standardized Balance Assessment Standardized Balance Assessment : Dynamic Gait Index   Dynamic Gait Index Level Surface: Normal Change in Gait Speed: Normal Gait with Horizontal Head Turns: Normal Gait with Vertical Head Turns: Normal Gait and Pivot Turn: Normal Step Over Obstacle: Normal Step Around Obstacles: Normal Steps: (did not do stairs)       Pertinent Vitals/Pain Pain Assessment: No/denies pain    Home Living Family/patient expects to be discharged to:: Private residence Living Arrangements: Parent Available Help at Discharge: Available PRN/intermittently Type of Home: House Home Access: Level entry     Home Layout: Two level Home Equipment: None Additional Comments: her mother has RW    Prior Function Level of Independence: Independent         Comments: Pt independent and cares for her mother who has dementia     Hand Dominance        Extremity/Trunk Assessment   Upper Extremity Assessment Upper Extremity Assessment: Overall WFL for tasks assessed(Demonstrating4+ to  5/5 strength throughout)    Lower Extremity Assessment Lower Extremity Assessment: Overall WFL for tasks assessed(Demonstrating 5/5 throughout)    Cervical / Trunk Assessment Cervical / Trunk Assessment: Normal  Communication   Communication: No  difficulties  Cognition Arousal/Alertness: Awake/alert Behavior During Therapy: WFL for tasks assessed/performed Overall Cognitive Status: Within Functional Limits for tasks assessed                                        General  Comments General comments (skin integrity, edema, etc.): pt reports she feels she is at baseline    Exercises     Assessment/Plan    PT Assessment Patent does not need any further PT services  PT Problem List         PT Treatment Interventions      PT Goals (Current goals can be found in the Care Plan section)  Acute Rehab PT Goals Patient Stated Goal: return home PT Goal Formulation: With patient Time For Goal Achievement: 11/08/19 Potential to Achieve Goals: Good    Frequency     Barriers to discharge        Co-evaluation               AM-PAC PT "6 Clicks" Mobility  Outcome Measure Help needed turning from your back to your side while in a flat bed without using bedrails?: None Help needed moving from lying on your back to sitting on the side of a flat bed without using bedrails?: None Help needed moving to and from a bed to a chair (including a wheelchair)?: None Help needed standing up from a chair using your arms (e.g., wheelchair or bedside chair)?: None Help needed to walk in hospital room?: None Help needed climbing 3-5 steps with a railing? : None 6 Click Score: 24    End of Session Equipment Utilized During Treatment: Gait belt Activity Tolerance: Patient tolerated treatment well Patient left: in bed;with call bell/phone within reach(no alarm on as pt already ambulating in room independently) Nurse Communication: Mobility status      Time: [id number] PT Time Calculation (min) (ACUTE ONLY): 25 min   Charges:   PT Evaluation $PT Eval Low Complexity: 1 Low          Maggie Font, PT Acute Rehab Services Pager [phone] [facility] [phone] [facility] [place] 10/25/2019, 4:50 PM

## 2019-10-25 NOTE — Progress Notes (Signed)
Progress Note    Monica Bauer  [id number] DOB: June 10, 1956  DOA: 10/17/2019 PCP: Thornton Dales I, MD      Brief Narrative:    Medical records reviewed and are as summarized below:  Monica Bauer is an 63 y.o. female with medical history significant forhypertension, breast cancer, and type 2 diabetes mellitus, obesity, was brought to the hospital because of intermittent episodes of confusion.  Exact etiology has not been identified has been started on Keppra for suspected seizures and IV acyclovir for suspected meningitis/encephalitis.      Assessment/Plan:   Principal Problem:   Acute encephalopathy Active Problems:   Hypertension   Diabetes mellitus without complication (HCC)   Mild renal insufficiency   Breast cancer (HCC)   Elevated vitamin B12 level   AMS (altered mental status)   Body mass index is 32.22 kg/m.   Acute encephalopathy/ suspected seizures/suspected meningitis/encephalitis: EEG did not show any epileptiform activity.  LP had been attempted but it was unsuccessful.  Continue IV acyclovir through 10/28/2019.  Continue Keppra.  Patient follow-up with [facility] neurology after discharge from the hospital.  Elevated B12 level: Outpatient follow-up recommended.  History of breast cancer 2013: Continue Arimidex  Type 2 diabetes mellitus: Hemoglobin A1c 6.7.  NovoLog as needed for hyperglycemia  AKI and hypokalemia: Resolved  Hypertension: Antihypertensives  Obesity: Weight loss advised  Debility: Consult PT      Family Communication/Anticipated D/C date and plan/Code Status   DVT prophylaxis: Lovenox Code Status: Full code Family Communication: Plan discussed with patient Disposition Plan: Home in 3 to 4 days     Subjective:   No headache, neck pain, fever, vomiting, shortness of breath or chest pain.   Vitals:   10/24/19 1336 10/24/19 2111 10/25/19 0507 10/25/19 1310  BP: 140/77 (!) 143/81 (!) 146/74 (!) 144/78   Pulse: 77 74 79 78  Resp: 18 20 17 18   Temp: 98.7 F (37.1 C) 98.1 F (36.7 C) 98.7 F (37.1 C) 98.9 F (37.2 C)  TempSrc: Oral   Oral  SpO2: 99% 100% 100% 99%  Weight:      Height:        Intake/Output Summary (Last 24 hours) at 10/25/2019 1523 Last data filed at 10/25/2019 0342 Gross per 24 hour  Intake 251.68 ml  Output -  Net 251.68 ml   Filed Weights   10/17/19 1655 10/22/19 0520  Weight: 90.3 kg 79.9 kg    Exam:  GEN: NAD SKIN: No rash EYES: EOMI ENT: MMM CV: RRR PULM: CTA B ABD: soft, obese, NT, +BS CNS: AAO x 3, non focal EXT: No edema or tenderness   Data Reviewed:   I have personally reviewed following labs and imaging studies:  Labs: Labs show the following:   Basic Metabolic Panel: Recent Labs  Lab 10/19/19 0437 10/20/19 0521 10/22/19 0403 10/23/19 0508 10/25/19 0225  NA 145 142 141 142  --   K 3.5 3.2* 3.2* 3.7  --   CL 107 107 106 108  --   CO2 22 24 23 22   --   GLUCOSE 130* 186* 93 104*  --   BUN 17 16 12 13   --   CREATININE 1.60* 1.36* 1.01* 1.08* 1.03*  CALCIUM 9.8 9.2 9.3 9.4  --    GFR Estimated Creatinine Clearance: 54.7 mL/min (A) (by C-G formula based on SCr of 1.03 mg/dL (H)). Liver Function Tests: No results for input(s): AST, ALT, ALKPHOS, BILITOT, PROT, ALBUMIN in the last 168 hours. No  results for input(s): LIPASE, AMYLASE in the last 168 hours. No results for input(s): AMMONIA in the last 168 hours. Coagulation profile No results for input(s): INR, PROTIME in the last 168 hours.  CBC: Recent Labs  Lab 10/19/19 0437 10/20/19 0521 10/22/19 0403 10/23/19 0508  WBC 14.2* 12.4* 11.6* 10.9*  HGB 13.3 12.2 12.3 12.8  HCT 39.5 36.9 36.3 38.4  MCV 84.9 86.0 84.6 85.1  PLT 272 243 264 250   Cardiac Enzymes: No results for input(s): CKTOTAL, CKMB, CKMBINDEX, TROPONINI in the last 168 hours. BNP (last 3 results) No results for input(s): PROBNP in the last 8760 hours. CBG: Recent Labs  Lab 10/24/19 1646  10/24/19 2112 10/25/19 0748 10/25/19 0856 10/25/19 1126  GLUCAP 96 106* 93 94 96   D-Dimer: No results for input(s): DDIMER in the last 72 hours. Hgb A1c: No results for input(s): HGBA1C in the last 72 hours. Lipid Profile: No results for input(s): CHOL, HDL, LDLCALC, TRIG, CHOLHDL, LDLDIRECT in the last 72 hours. Thyroid function studies: No results for input(s): TSH, T4TOTAL, T3FREE, THYROIDAB in the last 72 hours.  Invalid input(s): FREET3 Anemia work up: No results for input(s): VITAMINB12, FOLATE, FERRITIN, TIBC, IRON, RETICCTPCT in the last 72 hours. Sepsis Labs: Recent Labs  Lab 10/19/19 0437 10/20/19 0521 10/22/19 0403 10/23/19 0508  WBC 14.2* 12.4* 11.6* 10.9*    Microbiology Recent Results (from the past 240 hour(s))  SARS CORONAVIRUS 2 (TAT 6-24 HRS) Nasopharyngeal Nasopharyngeal Swab     Status: None   Collection Time: 10/17/19  6:35 PM   Specimen: Nasopharyngeal Swab  Result Value Ref Range Status   SARS Coronavirus 2 NEGATIVE NEGATIVE Final    Comment: (NOTE) SARS-CoV-2 target nucleic acids are NOT DETECTED. The SARS-CoV-2 RNA is generally detectable in upper and lower respiratory specimens during the acute phase of infection. Negative results do not preclude SARS-CoV-2 infection, do not rule out co-infections with other pathogens, and should not be used as the sole basis for treatment or other patient management decisions. Negative results must be combined with clinical observations, patient history, and epidemiological information. The expected result is Negative. Fact Sheet for Patients: SugarRoll.be Fact Sheet for Healthcare Providers: https://www.woods-mathews.com/ This test is not yet approved or cleared by the [place] FDA and  has been authorized for detection and/or diagnosis of SARS-CoV-2 by FDA under an Emergency Use Authorization (EUA). This EUA will remain  in effect (meaning this test can be  used) for the duration of the COVID-19 declaration under Section 56 4(b)(1) of the Act, 21 U.S.C. section 360bbb-3(b)(1), unless the authorization is terminated or revoked sooner. Performed at [facility], [place] [address] [place], [place] [address]     Procedures and diagnostic studies:  No results found.  Medications:   . amLODipine  5 mg Oral Daily  . atorvastatin  10 mg Oral QPM  . enoxaparin (LOVENOX) injection  40 mg Subcutaneous Q24H  . insulin aspart  0-15 Units Subcutaneous TID WC  . insulin aspart  0-5 Units Subcutaneous QHS  . levETIRAcetam  250 mg Oral BID   Followed by  . [START ON 10/30/2019] levETIRAcetam  500 mg Oral BID  . multivitamin with minerals  1 tablet Oral Daily  . sodium chloride flush  3 mL Intravenous Q12H   Continuous Infusions:   LOS: 7 days   Nava Song  [facility]   *Please refer to [email or web address], password [email or web address] to get updated schedule on who will round on this patient, as hospitalists  switch teams weekly. If 7PM-7AM, please contact night-coverage at [email or web address], password [email or web address] for any overnight needs.  10/25/2019, 3:23 PM

## 2019-10-25 NOTE — Plan of Care (Signed)

## 2019-10-26 LAB — GLUCOSE, CAPILLARY
Glucose-Capillary: 104 mg/dL — ABNORMAL HIGH (ref 70–99)
Glucose-Capillary: 183 mg/dL — ABNORMAL HIGH (ref 70–99)
Glucose-Capillary: 88 mg/dL (ref 70–99)
Glucose-Capillary: 93 mg/dL (ref 70–99)

## 2019-10-26 MED ORDER — DEXTROSE 5 % IV SOLN: 10.0000 mg/kg | Freq: Two times a day (BID) | INTRAVENOUS | Status: DC

## 2019-10-26 MED ORDER — DEXTROSE 5 % IV SOLN
620.0000 mg | Freq: Two times a day (BID) | INTRAVENOUS | Status: DC
  Administered 2019-10-26 (×2): 620 mg via INTRAVENOUS
  Filled 2019-10-26 (×4): qty 12.4

## 2019-10-26 NOTE — Progress Notes (Signed)
Progress Note    Monica Bauer  [id number] DOB: 03-Aug-1956  DOA: 10/17/2019 PCP: Thornton Dales I, MD      Brief Narrative:    Medical records reviewed and are as summarized below:  Monica Bauer is an 63 y.o. female with medical history significant forhypertension, breast cancer, and type 2 diabetes mellitus, obesity, was brought to the hospital because of intermittent episodes of confusion.  Exact etiology has not been identified has been started on Keppra for suspected seizures and IV acyclovir for suspected meningitis/encephalitis.      Assessment/Plan:   Principal Problem:   Acute encephalopathy Active Problems:   Hypertension   Diabetes mellitus without complication (HCC)   Mild renal insufficiency   Breast cancer (HCC)   Elevated vitamin B12 level   AMS (altered mental status)   Body mass index is 32.22 kg/m.   Acute encephalopathy/ suspected seizures/suspected meningitis/encephalitis: EEG did not show any epileptiform activity.  LP had been attempted but it was unsuccessful.  Continue IV acyclovir through 10/29/2019.  Continue Keppra.  Outpatient follow-up with [facility] neurology after discharge from the hospital.  Elevated B12 level: Outpatient follow-up recommended.  History of breast cancer 2013: Continue Arimidex  Type 2 diabetes mellitus: Hemoglobin A1c 6.7.  NovoLog as needed for hyperglycemia  AKI and hypokalemia: Resolved  Hypertension: Continue amlodipine  Obesity: Weight loss advised  Debility: Patient did well with PT and OT and no skilled needs required.     Family Communication/Anticipated D/C date and plan/Code Status   DVT prophylaxis: Lovenox Code Status: Full code Family Communication: Plan discussed with patient Disposition Plan: Home in 3 to 4 days     Subjective:   No complaints. No headache, confusion or neck pain.  She feels better.   Vitals:   10/25/19 1310 10/25/19 2127 10/26/19 0451 10/26/19  1336  BP: (!) 144/78 127/73 (!) 141/99 130/79  Pulse: 78 73 72 70  Resp: 18   18  Temp: 98.9 F (37.2 C) 100 F (37.8 C) (!) 97.5 F (36.4 C) 98.7 F (37.1 C)  TempSrc: Oral Oral Oral Oral  SpO2: 99% 98% 100% 99%  Weight:      Height:       No intake or output data in the 24 hours ending 10/26/19 1507 Filed Weights   10/17/19 1655 10/22/19 0520  Weight: 90.3 kg 79.9 kg    Exam:  GEN: NAD SKIN: No rash EYES: no pallor or icterus ENT: MMM CV: RRR PULM: CTA B ABD: soft, obese, NT, +BS CNS: AAO x 3, non focal EXT: No edema or tenderness    Data Reviewed:   I have personally reviewed following labs and imaging studies:  Labs: Labs show the following:   Basic Metabolic Panel: Recent Labs  Lab 10/20/19 0521 10/22/19 0403 10/23/19 0508 10/25/19 0225  NA 142 141 142  --   K 3.2* 3.2* 3.7  --   CL 107 106 108  --   CO2 24 23 22   --   GLUCOSE 186* 93 104*  --   BUN 16 12 13   --   CREATININE 1.36* 1.01* 1.08* 1.03*  CALCIUM 9.2 9.3 9.4  --    GFR Estimated Creatinine Clearance: 54.7 mL/min (A) (by C-G formula based on SCr of 1.03 mg/dL (H)). Liver Function Tests: No results for input(s): AST, ALT, ALKPHOS, BILITOT, PROT, ALBUMIN in the last 168 hours. No results for input(s): LIPASE, AMYLASE in the last 168 hours. No results for input(s): AMMONIA in  the last 168 hours. Coagulation profile No results for input(s): INR, PROTIME in the last 168 hours.  CBC: Recent Labs  Lab 10/20/19 0521 10/22/19 0403 10/23/19 0508  WBC 12.4* 11.6* 10.9*  HGB 12.2 12.3 12.8  HCT 36.9 36.3 38.4  MCV 86.0 84.6 85.1  PLT 243 264 250   Cardiac Enzymes: No results for input(s): CKTOTAL, CKMB, CKMBINDEX, TROPONINI in the last 168 hours. BNP (last 3 results) No results for input(s): PROBNP in the last 8760 hours. CBG: Recent Labs  Lab 10/25/19 1703 10/25/19 1714 10/25/19 2129 10/26/19 0755 10/26/19 1210  GLUCAP 115* 99 95 104* 183*   D-Dimer: No results for  input(s): DDIMER in the last 72 hours. Hgb A1c: No results for input(s): HGBA1C in the last 72 hours. Lipid Profile: No results for input(s): CHOL, HDL, LDLCALC, TRIG, CHOLHDL, LDLDIRECT in the last 72 hours. Thyroid function studies: No results for input(s): TSH, T4TOTAL, T3FREE, THYROIDAB in the last 72 hours.  Invalid input(s): FREET3 Anemia work up: No results for input(s): VITAMINB12, FOLATE, FERRITIN, TIBC, IRON, RETICCTPCT in the last 72 hours. Sepsis Labs: Recent Labs  Lab 10/20/19 0521 10/22/19 0403 10/23/19 0508  WBC 12.4* 11.6* 10.9*    Microbiology Recent Results (from the past 240 hour(s))  SARS CORONAVIRUS 2 (TAT 6-24 HRS) Nasopharyngeal Nasopharyngeal Swab     Status: None   Collection Time: 10/17/19  6:35 PM   Specimen: Nasopharyngeal Swab  Result Value Ref Range Status   SARS Coronavirus 2 NEGATIVE NEGATIVE Final    Comment: (NOTE) SARS-CoV-2 target nucleic acids are NOT DETECTED. The SARS-CoV-2 RNA is generally detectable in upper and lower respiratory specimens during the acute phase of infection. Negative results do not preclude SARS-CoV-2 infection, do not rule out co-infections with other pathogens, and should not be used as the sole basis for treatment or other patient management decisions. Negative results must be combined with clinical observations, patient history, and epidemiological information. The expected result is Negative. Fact Sheet for Patients: SugarRoll.be Fact Sheet for Healthcare Providers: https://www.woods-mathews.com/ This test is not yet approved or cleared by the [place] FDA and  has been authorized for detection and/or diagnosis of SARS-CoV-2 by FDA under an Emergency Use Authorization (EUA). This EUA will remain  in effect (meaning this test can be used) for the duration of the COVID-19 declaration under Section 56 4(b)(1) of the Act, 21 U.S.C. section 360bbb-3(b)(1), unless the  authorization is terminated or revoked sooner. Performed at [facility], [place] [address] [place], [place] [address]     Procedures and diagnostic studies:  No results found.  Medications:   . amLODipine  5 mg Oral Daily  . atorvastatin  10 mg Oral QPM  . enoxaparin (LOVENOX) injection  40 mg Subcutaneous Q24H  . insulin aspart  0-15 Units Subcutaneous TID WC  . insulin aspart  0-5 Units Subcutaneous QHS  . levETIRAcetam  250 mg Oral BID   Followed by  . [START ON 10/30/2019] levETIRAcetam  500 mg Oral BID  . multivitamin with minerals  1 tablet Oral Daily  . sodium chloride flush  3 mL Intravenous Q12H   Continuous Infusions: . acyclovir 620 mg (10/26/19 1032)     LOS: 8 days   Braden Cimo  [facility]   *Please refer to [email or web address], password [email or web address] to get updated schedule on who will round on this patient, as hospitalists switch teams weekly. If 7PM-7AM, please contact night-coverage at [email or web address], password [email or web address] for any overnight needs.  10/26/2019,  3:07 PM

## 2019-10-27 ENCOUNTER — Inpatient Hospital Stay (HOSPITAL_COMMUNITY): Payer: Medicare Other

## 2019-10-27 LAB — COMPREHENSIVE METABOLIC PANEL
ALT: 77 U/L — ABNORMAL HIGH (ref 0–44)
AST: 65 U/L — ABNORMAL HIGH (ref 15–41)
Albumin: 3.2 g/dL — ABNORMAL LOW (ref 3.5–5.0)
Alkaline Phosphatase: 54 U/L (ref 38–126)
Anion gap: 12 (ref 5–15)
BUN: 15 mg/dL (ref 8–23)
CO2: 21 mmol/L — ABNORMAL LOW (ref 22–32)
Calcium: 9.4 mg/dL (ref 8.9–10.3)
Chloride: 108 mmol/L (ref 98–111)
Creatinine, Ser: 1 mg/dL (ref 0.44–1.00)
GFR calc Af Amer: >60 mL/min (ref >60)
GFR calc non Af Amer: 60 mL/min — ABNORMAL LOW (ref >60)
Glucose, Bld: 105 mg/dL — ABNORMAL HIGH (ref 70–99)
Potassium: 3.6 mmol/L (ref 3.5–5.1)
Sodium: 141 mmol/L (ref 135–145)
Total Bilirubin: 0.8 mg/dL (ref 0.3–1.2)
Total Protein: 6.9 g/dL (ref 6.5–8.1)

## 2019-10-27 LAB — GLUCOSE, CAPILLARY
Glucose-Capillary: 89 mg/dL (ref 70–99)
Glucose-Capillary: 111 mg/dL — ABNORMAL HIGH (ref 70–99)
Glucose-Capillary: 139 mg/dL — ABNORMAL HIGH (ref 70–99)
Glucose-Capillary: 98 mg/dL (ref 70–99)

## 2019-10-27 LAB — CBC
HCT: 37.6 % (ref 36.0–46.0)
Hemoglobin: 12.6 g/dL (ref 12.0–15.0)
MCH: 28.3 pg (ref 26.0–34.0)
MCHC: 33.5 g/dL (ref 30.0–36.0)
MCV: 84.3 fL (ref 80.0–100.0)
Platelets: 265 10*3/uL (ref 150–400)
RBC: 4.46 MIL/uL (ref 3.87–5.11)
RDW: 14.1 % (ref 11.5–15.5)
WBC: 8.6 10*3/uL (ref 4.0–10.5)
nRBC: 0 % (ref 0.0–0.2)

## 2019-10-27 LAB — AMMONIA: Ammonia: 31 umol/L (ref 9–35)

## 2019-10-27 IMAGING — US US ABDOMEN LIMITED
1 series · 14 of 25 positions shown · non-contrast
Comparison: None.

CLINICAL DATA: Elevated LFTs.

EXAM:
ULTRASOUND ABDOMEN LIMITED RIGHT UPPER QUADRANT

[Series 1: us abdomen limited · 14 of 39 slices shown]
[im 1/39]
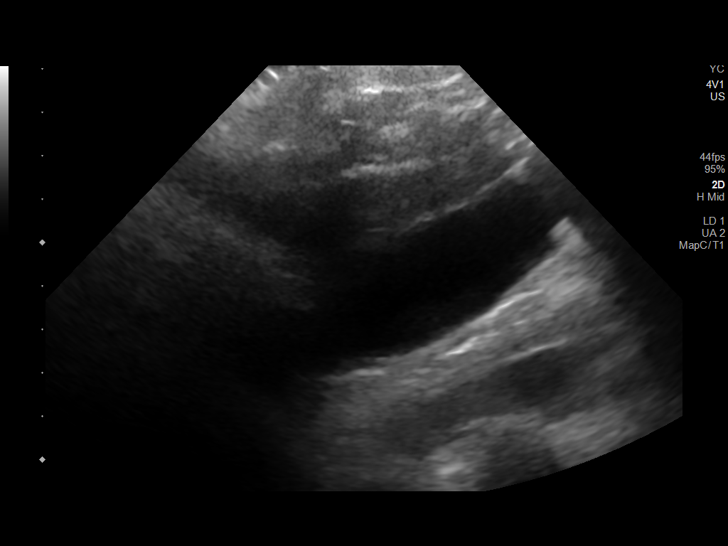
[im 4/39]
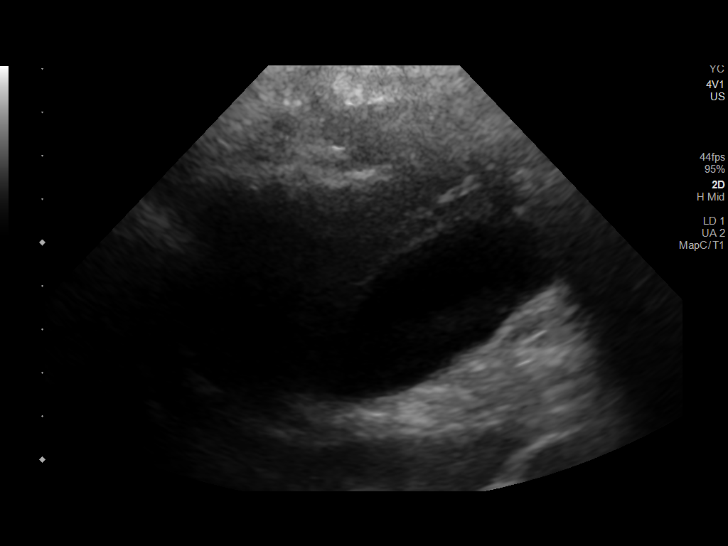
[im 7/39]
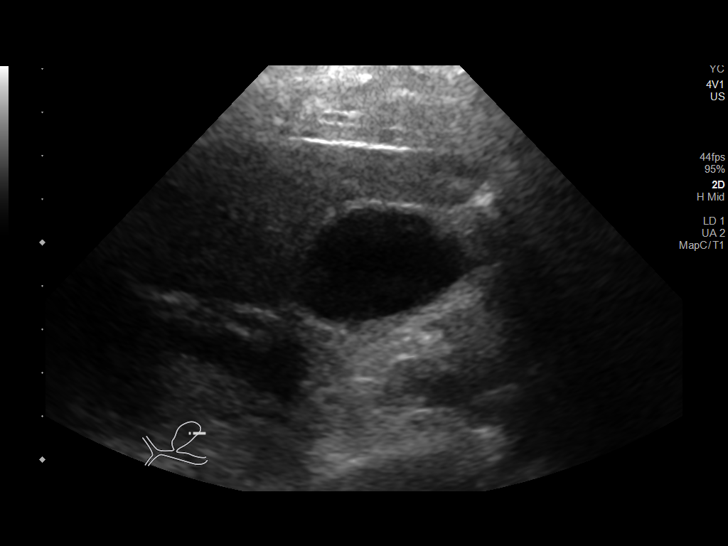
[im 10/39]
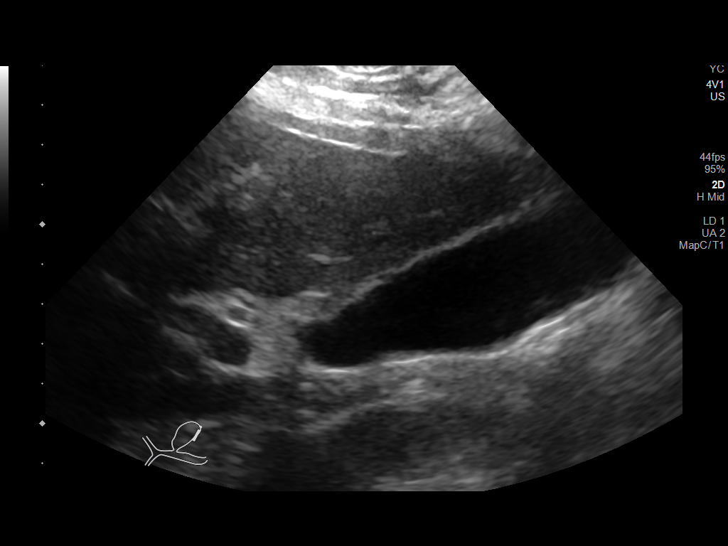
[im 13/39]
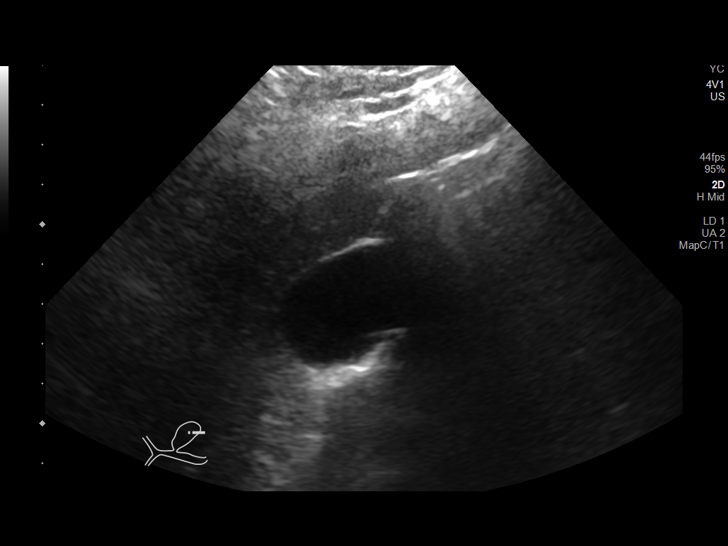
[im 15/39]
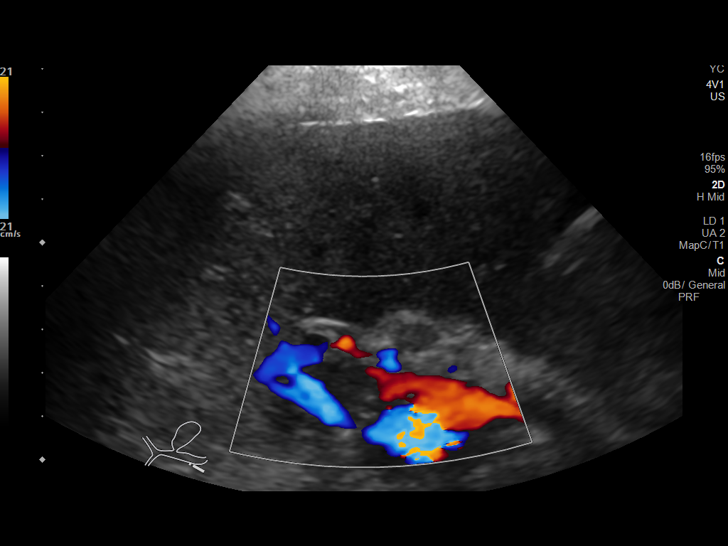
[im 18/39]
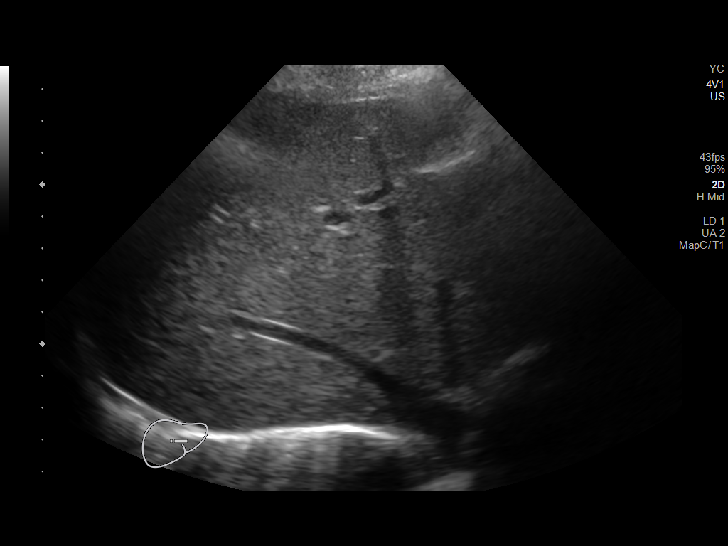
[im 21/39]
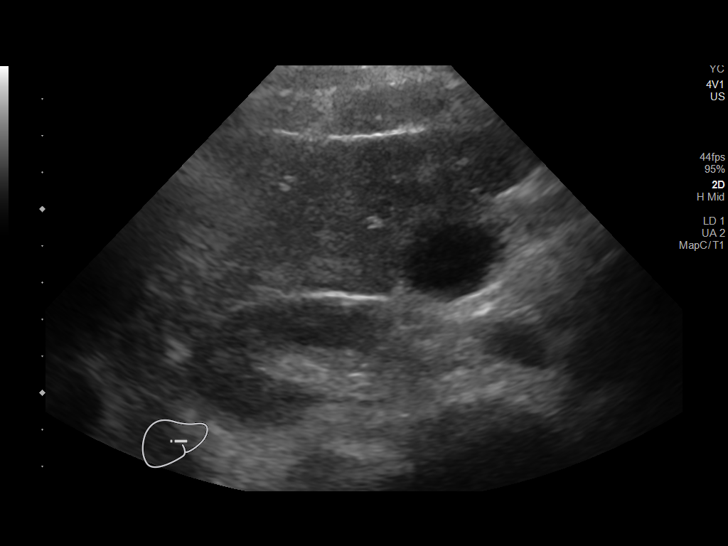
[im 24/39]
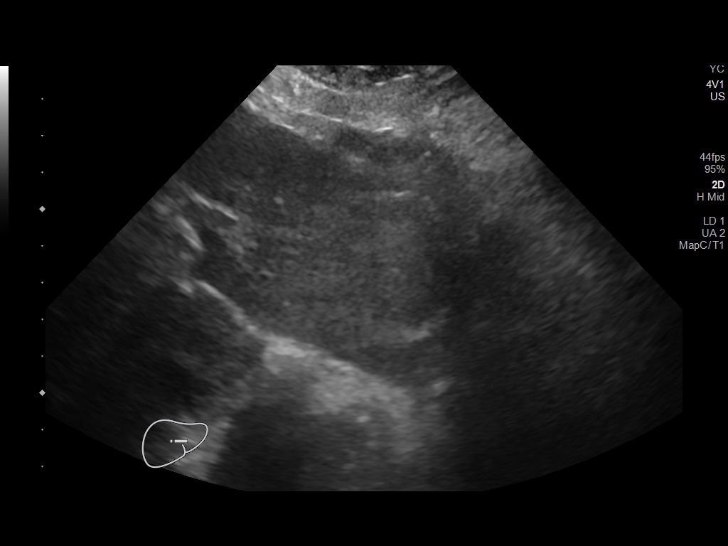
[im 26/39]
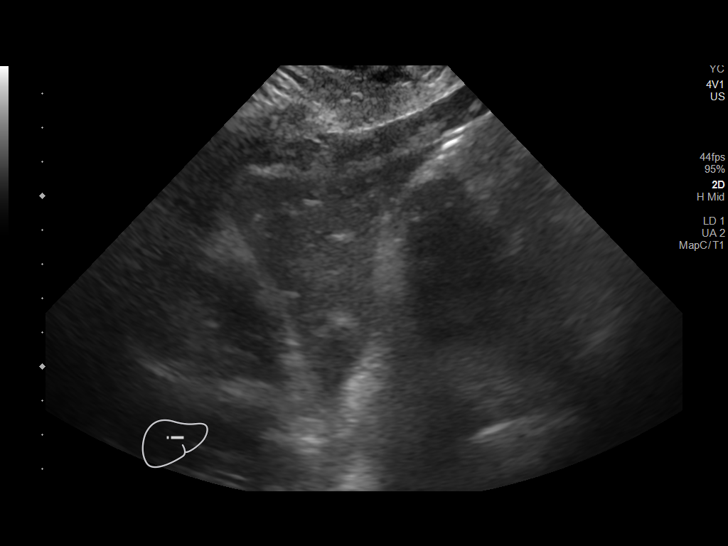
[im 29/39]
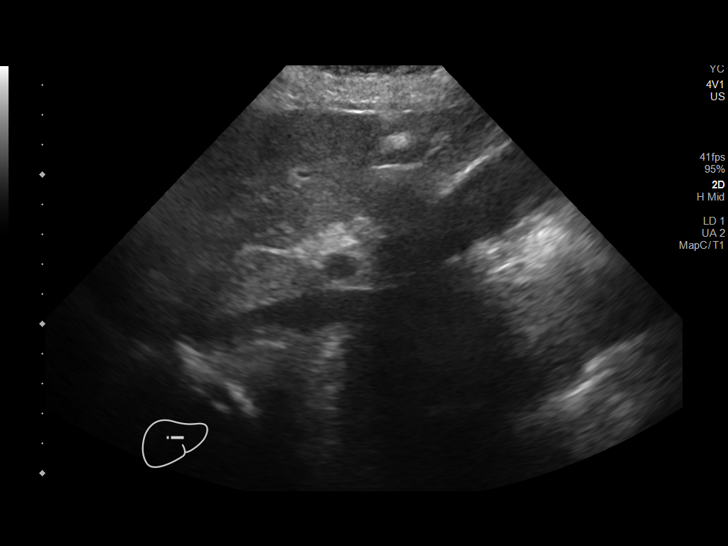
[im 32/39]
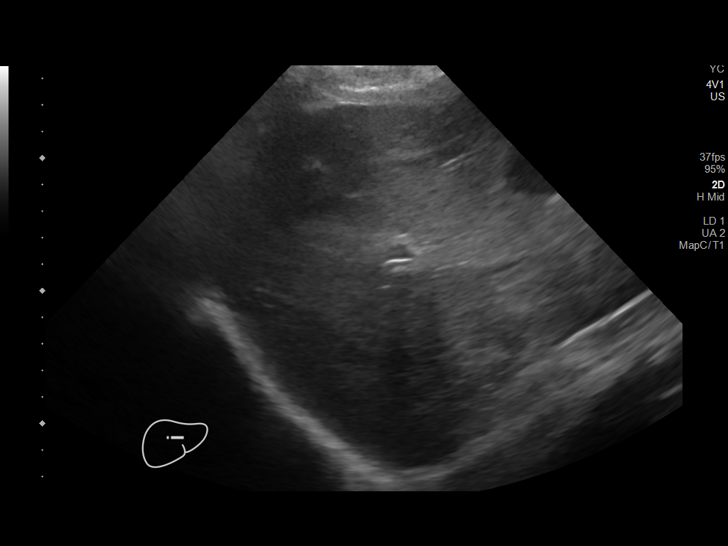
[im 35/39]
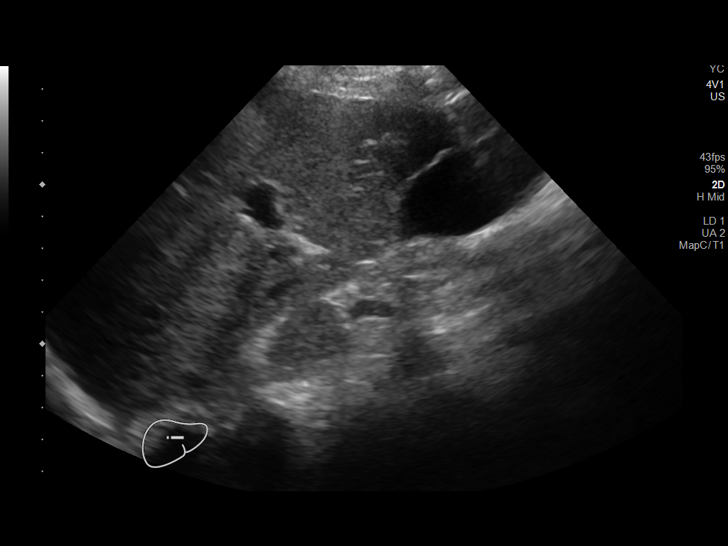
[im 39/39]
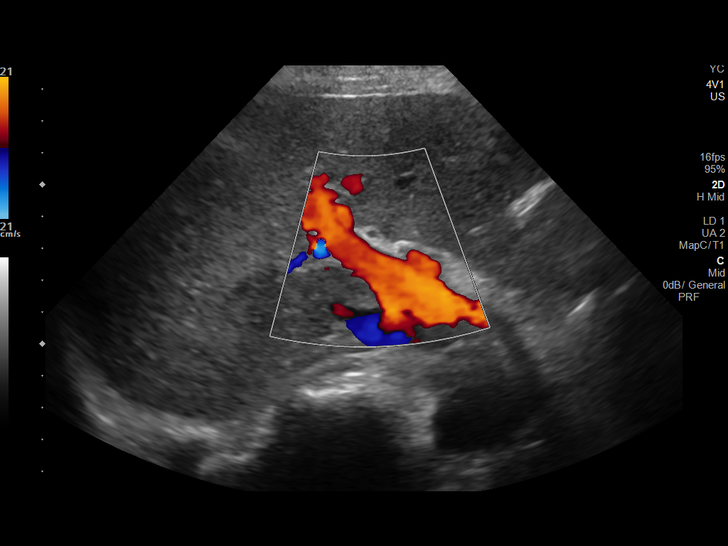

[14 of 25 positions shown; findings below may reference images not displayed]

FINDINGS: Gallbladder:

No gallstones or wall thickening visualized. No sonographic Murphy
sign noted by sonographer.

Common bile duct:

Diameter: 2.1 mm

Liver:

No focal lesion identified. Within normal limits in parenchymal
echogenicity. Portal vein is patent on color Doppler imaging with
normal direction of blood flow towards the liver.

Other: None.
IMPRESSION: Normal study.  No cause for LFT elevation identified.

## 2019-10-27 NOTE — Progress Notes (Signed)
Patient continues to gesture instead of talk.  I asked her to please use words with me because I'm not sure what her gestures mean.  She whispered she "can't talk because my lips are chapped."  Pt was able to get out of bed and ambulate independently to bathroom.    Pt is licking lips and opening and closing mouth like in tardive dyskinesia.  Pt said "don't know when it started, maybe a long time." She was unable to say if she had been on any medications that could cause it.

## 2019-10-27 NOTE — Progress Notes (Signed)
PROGRESS NOTE    Monica Bauer    Code Status: Full Code  [id number] DOB: 15-Mar-1956 DOA: 10/17/2019  PCP: Lauraine Rinne, MD    Hospital Summary  Monica Bauer is an 63 y.o. female with medical history significant forhypertension, breast cancer, and type 2 diabetes mellitus, presenting to the emergency department for evaluation of confusion. Patient was brought in to the ED by Monica Bauer who has been concerned for waxing and waning confusion over the past 3 to 4 days. Patient seemed to be in Monica usual state of health until she seemed to be slightly confused on 10/13/2019. She has seemed to be lucid at times, but overall seems to be worsening, not caring for herself, and calling family in the middle of the night with marked confusion and garbled speech. Patient is normally alert and fully oriented, manages all of Monica ADLs, and also cares for Monica elderly mother.  Reviewed chart further: 12/14 seen at [facility]- had MRI for memory issues 10/15 was at [facility]- seen by ER doc-- diagnosed with TGA  We are treating as seizures as she had several "spells" in the hospital.  None captured on EEG so dementia/sleep disorder is on the differential.  Initially thought to be HSV meningitis but LP was not tolerated.  Plan to treat for 7 days with acyclovir.   A & P   Principal Problem:   Acute encephalopathy Active Problems:   Hypertension   Diabetes mellitus without complication (HCC)   Mild renal insufficiency   Breast cancer (HCC)   Elevated vitamin B12 level   AMS (altered mental status)   Acute encephalopathy-  Seizures vs worsening of dementia versus infection vs. Medication induced in setting of AKI vs. Multifactorial in setting of above -Presented 11/19with 3-4 days of waxing and waning confusion, not caring for self or Monica 30 yo mother with whom she resides -No acute findings noted on head CT or MRI brain. -chart review: 12/14 seen at [facility]- had MRI for memory issues 10/15 was  at [facility]- seen by ER doc-- diagnosed with TGA -Neurologyevaluated: broad differential diagnosis-- seizures vs infection vs worsening dementia-- LP unsuccessful at bedside and with IR - previous hospitalist spoke with ID;  no clear evidence of HSV meningitis-- no EEG changes doubt this diagnosis and hence why not treating for 21 days -B1 normal -RPR and UDS- negative  -LTM EEG: no seizures but patient did not sleep much, patient told neurology that she has OSA but did not like the CPAP so she does not use -has had several seizure like episodes in the hospital but we were not able to catch on EEG -Lunesta on home med list -Initially empiric acyclovirfor 7 days and therapy was lengthened until 12/1 (total 11 days), however patient is currently afebrile and leukocytosis has resolved and acyclovir can cause AMS especially in setting of AKI which the patient had. Since no signs of infection at this time, will stop acyclovir and monitor overnight. -started keppra 250 bid and increase to 500mg  -will need seizure precautions and outpatient follow up  Mildly Elevated LFTs of unknown etiology Asymptomatic -RUQ ultrasound -follow up in AM  Elevated B12 -? Etiology, possibly liver disease with elevated LFTs -does not have B12 on home meds  -outpatient follow up  H/o breast cancer 2013 Continue arimidex  Type II DM -A1c 6.7% -holding oral agents -SSI for optimal control  AKI Resolved with IVF  Hypertension Stable on amlodipine  Hypokalemia resolved  obesity Body mass index is 32.[place]  kg/m.   DVT prophylaxis: lovenox Diet: carb modified Family Communication: Patient's Bauer has been updated by phone Disposition Plan: DC planning for 1-2 days  Consultants  neurology  Antibiotics   Anti-infectives (From admission, onward)   Start     Dose/Rate Route Frequency Ordered Stop   10/26/19 1000  acyclovir (ZOVIRAX) 800 mg in dextrose 5 % 150 mL IVPB  Status:  Discontinued      10 mg/kg  79.9 kg 166 mL/hr over 60 Minutes Intravenous Every 12 hours 10/26/19 0813 10/26/19 0813   10/26/19 1000  acyclovir (ZOVIRAX) 620 mg in dextrose 5 % 100 mL IVPB  Status:  Discontinued     620 mg 112.4 mL/hr over 60 Minutes Intravenous Every 12 hours 10/26/19 0813 10/27/19 0717   10/22/19 1800  acyclovir (ZOVIRAX) 620 mg in dextrose 5 % 100 mL IVPB     10 mg/kg  62 kg (Adjusted) 112.4 mL/hr over 60 Minutes Intravenous Every 8 hours 10/22/19 1242 10/25/19 0145   10/19/19 2100  acyclovir (ZOVIRAX) 660 mg in dextrose 5 % 100 mL IVPB  Status:  Discontinued     10 mg/kg  66.2 kg (Adjusted) 113.2 mL/hr over 60 Minutes Intravenous Every 12 hours 10/19/19 1022 10/22/19 1242   10/18/19 1000  acyclovir (ZOVIRAX) 660 mg in dextrose 5 % 100 mL IVPB  Status:  Discontinued     10 mg/kg  66.2 kg (Adjusted) 113.2 mL/hr over 60 Minutes Intravenous Every 8 hours 10/18/19 0834 10/19/19 1022   10/18/19 0800  acyclovir (ZOVIRAX) 750 mg in dextrose 5 % 150 mL IVPB  Status:  Discontinued     750 mg 165 mL/hr over 60 Minutes Intravenous Every 8 hours 10/17/19 2349 10/18/19 0833   10/18/19 0030  acyclovir (ZOVIRAX) 750 mg in dextrose 5 % 150 mL IVPB     750 mg 165 mL/hr over 60 Minutes Intravenous  Once 10/17/19 2349 10/18/19 0300           Subjective   Patient examined at bedside no acute distress resting comfortably.  She denies any complaints at this time.  She states she lives at home with Monica mother when she takes care of.  Feels as though Monica symptoms have improved but does not seem to be sure why she is in the hospital.  She denies any complaints at this time  Discussed patient's baseline mental status over the phone with patient's Bauer.  States she is typically independent with ADLs and takes care of Monica mother which was corroborated with above.  He states that initially she was confused but seems to have been doing better over the past couple days.  He works at night and has  difficulty getting to the hospital to see Monica but is planning on coming this evening and tomorrow.  Objective   Vitals:   10/26/19 0451 10/26/19 1336 10/26/19 2107 10/27/19 1215  BP: (!) 141/99 130/79 136/78 (!) 141/74  Pulse: 72 70 70 64  Resp:  18  16  Temp: (!) 97.5 F (36.4 C) 98.7 F (37.1 C) 98.7 F (37.1 C) 98.8 F (37.1 C)  TempSrc: Oral Oral Oral Oral  SpO2: 100% 99% 97% 100%  Weight:      Height:        Intake/Output Summary (Last 24 hours) at 10/27/2019 1626 Last data filed at 10/27/2019 1200 Gross per 24 hour  Intake 1580.46 ml  Output --  Net 1580.46 ml   Filed Weights   10/17/19 1655 10/22/19 0520  Weight: 90.3 kg 79.9 kg    Examination:  Physical Exam Vitals signs and nursing note reviewed.  Constitutional:      Appearance: Normal appearance.  HENT:     Head: Normocephalic.     Mouth/Throat:     Mouth: Mucous membranes are moist.     Comments: Lipsmacking Eyes:     Extraocular Movements: Extraocular movements intact.  Neck:     Musculoskeletal: Normal range of motion. No neck rigidity.  Cardiovascular:     Rate and Rhythm: Normal rate and regular rhythm.  Pulmonary:     Effort: Pulmonary effort is normal.     Breath sounds: Normal breath sounds.  Abdominal:     General: Abdomen is flat.     Palpations: Abdomen is soft.  Musculoskeletal: Normal range of motion.        General: No swelling.  Neurological:     General: No focal deficit present.     Mental Status: She is alert.     Comments: AO x2  Psychiatric:        Mood and Affect: Mood normal.        Behavior: Behavior normal.     Data Reviewed: I have personally reviewed following labs and imaging studies  CBC: Recent Labs  Lab 10/22/19 0403 10/23/19 0508 10/27/19 0920  WBC 11.6* 10.9* 8.6  HGB 12.3 12.8 12.6  HCT 36.3 38.4 37.6  MCV 84.6 85.1 84.3  PLT 264 250 [id number]   Basic Metabolic Panel: Recent Labs  Lab 10/22/19 0403 10/23/19 0508 10/25/19 0225 10/27/19 0920   NA 141 142  --  141  K 3.2* 3.7  --  3.6  CL 106 108  --  108  CO2 23 22  --  21*  GLUCOSE 93 104*  --  105*  BUN 12 13  --  15  CREATININE 1.01* 1.08* 1.03* 1.00  CALCIUM 9.3 9.4  --  9.4   GFR: Estimated Creatinine Clearance: 56.4 mL/min (by C-G formula based on SCr of 1 mg/dL). Liver Function Tests: Recent Labs  Lab 10/27/19 0920  AST 65*  ALT 77*  ALKPHOS 54  BILITOT 0.8  PROT 6.9  ALBUMIN 3.2*   No results for input(s): LIPASE, AMYLASE in the last 168 hours. Recent Labs  Lab 10/27/19 1432  AMMONIA 31   Coagulation Profile: No results for input(s): INR, PROTIME in the last 168 hours. Cardiac Enzymes: No results for input(s): CKTOTAL, CKMB, CKMBINDEX, TROPONINI in the last 168 hours. BNP (last 3 results) No results for input(s): PROBNP in the last 8760 hours. HbA1C: No results for input(s): HGBA1C in the last 72 hours. CBG: Recent Labs  Lab 10/26/19 1210 10/26/19 1655 10/26/19 2200 10/27/19 0755 10/27/19 1139  GLUCAP 183* 88 93 89 111*   Lipid Profile: No results for input(s): CHOL, HDL, LDLCALC, TRIG, CHOLHDL, LDLDIRECT in the last 72 hours. Thyroid Function Tests: No results for input(s): TSH, T4TOTAL, FREET4, T3FREE, THYROIDAB in the last 72 hours. Anemia Panel: No results for input(s): VITAMINB12, FOLATE, FERRITIN, TIBC, IRON, RETICCTPCT in the last 72 hours. Sepsis Labs: No results for input(s): PROCALCITON, LATICACIDVEN in the last 168 hours.  Recent Results (from the past 240 hour(s))  SARS CORONAVIRUS 2 (TAT 6-24 HRS) Nasopharyngeal Nasopharyngeal Swab     Status: None   Collection Time: 10/17/19  6:35 PM   Specimen: Nasopharyngeal Swab  Result Value Ref Range Status   SARS Coronavirus 2 NEGATIVE NEGATIVE Final    Comment: (NOTE) SARS-CoV-2 target nucleic acids  are NOT DETECTED. The SARS-CoV-2 RNA is generally detectable in upper and lower respiratory specimens during the acute phase of infection. Negative results do not preclude  SARS-CoV-2 infection, do not rule out co-infections with other pathogens, and should not be used as the sole basis for treatment or other patient management decisions. Negative results must be combined with clinical observations, patient history, and epidemiological information. The expected result is Negative. Fact Sheet for Patients: SugarRoll.be Fact Sheet for Healthcare Providers: https://www.woods-mathews.com/ This test is not yet approved or cleared by the [place] FDA and  has been authorized for detection and/or diagnosis of SARS-CoV-2 by FDA under an Emergency Use Authorization (EUA). This EUA will remain  in effect (meaning this test can be used) for the duration of the COVID-19 declaration under Section 56 4(b)(1) of the Act, 21 U.S.C. section 360bbb-3(b)(1), unless the authorization is terminated or revoked sooner. Performed at [facility], [place] [address], [place], [place] [address]          Radiology Studies: No results found.      Scheduled Meds:  amLODipine  5 mg Oral Daily   atorvastatin  10 mg Oral QPM   enoxaparin (LOVENOX) injection  40 mg Subcutaneous Q24H   insulin aspart  0-15 Units Subcutaneous TID WC   insulin aspart  0-5 Units Subcutaneous QHS   levETIRAcetam  250 mg Oral BID   Followed by   Derrill Memo ON 10/30/2019] levETIRAcetam  500 mg Oral BID   multivitamin with minerals  1 tablet Oral Daily   sodium chloride flush  3 mL Intravenous Q12H   Continuous Infusions:   LOS: 9 days    Time spent: 30 minutes with over 50% of the time coordinating the patient's care    Harold Hedge, DO [facility] Pager [phone]  If 7PM-7AM, please contact night-coverage [email or web address] Password [facility] 10/27/2019, 4:26 PM

## 2019-10-28 DIAGNOSIS — R7989 Other specified abnormal findings of blood chemistry: Secondary | ICD-10-CM

## 2019-10-28 LAB — HEPATITIS PANEL, ACUTE
HCV Ab: NONREACTIVE
Hep A IgM: NONREACTIVE
Hep B C IgM: NONREACTIVE
Hepatitis B Surface Ag: NONREACTIVE

## 2019-10-28 LAB — COMPREHENSIVE METABOLIC PANEL
ALT: 80 U/L — ABNORMAL HIGH (ref 0–44)
AST: 71 U/L — ABNORMAL HIGH (ref 15–41)
Albumin: 3.1 g/dL — ABNORMAL LOW (ref 3.5–5.0)
Alkaline Phosphatase: 53 U/L (ref 38–126)
Anion gap: 13 (ref 5–15)
BUN: 15 mg/dL (ref 8–23)
CO2: 23 mmol/L (ref 22–32)
Calcium: 9.2 mg/dL (ref 8.9–10.3)
Chloride: 104 mmol/L (ref 98–111)
Creatinine, Ser: 1.1 mg/dL — ABNORMAL HIGH (ref 0.44–1.00)
GFR calc Af Amer: >60 mL/min (ref >60)
GFR calc non Af Amer: 53 mL/min — ABNORMAL LOW (ref >60)
Glucose, Bld: 87 mg/dL (ref 70–99)
Potassium: 3.8 mmol/L (ref 3.5–5.1)
Sodium: 140 mmol/L (ref 135–145)
Total Bilirubin: 0.6 mg/dL (ref 0.3–1.2)
Total Protein: 6.4 g/dL — ABNORMAL LOW (ref 6.5–8.1)

## 2019-10-28 LAB — CBC
HCT: 37.3 % (ref 36.0–46.0)
Hemoglobin: 12.3 g/dL (ref 12.0–15.0)
MCH: 28.3 pg (ref 26.0–34.0)
MCHC: 33 g/dL (ref 30.0–36.0)
MCV: 85.7 fL (ref 80.0–100.0)
Platelets: 298 10*3/uL (ref 150–400)
RBC: 4.35 MIL/uL (ref 3.87–5.11)
RDW: 14.1 % (ref 11.5–15.5)
WBC: 9.7 10*3/uL (ref 4.0–10.5)
nRBC: 0 % (ref 0.0–0.2)

## 2019-10-28 LAB — GLUCOSE, CAPILLARY
Glucose-Capillary: 95 mg/dL (ref 70–99)
Glucose-Capillary: 132 mg/dL — ABNORMAL HIGH (ref 70–99)

## 2019-10-28 MED ORDER — LEVETIRACETAM 250 MG PO TABS: 250.0000 mg | ORAL_TABLET | Freq: Two times a day (BID) | ORAL | 0 refills | Status: DC

## 2019-10-28 MED ORDER — LEVETIRACETAM 500 MG PO TABS: 500.0000 mg | ORAL_TABLET | Freq: Two times a day (BID) | ORAL | 0 refills | Status: DC

## 2019-10-28 NOTE — TOC Transition Note (Signed)
Transition of Care [facility]) - CM/SW Discharge Note   Patient Details  Name: Monica Bauer MRN: [id number] Date of Birth: [birth year]  [facility] of [facility]) CM/SW Contact:  Benard Halsted, LCSW Phone Number: 10/28/2019, 1:47 PM   Clinical Narrative:    CSW received report from staff that patient's son was concerned about patient's forgetfulness at home. CSW touched base with patient and her son and offered to set up a home health nurse, but patient is declining at this time. She reports understanding that she can go through her PCP if she changes her mind. No other needs identified.    Final next level of care: Home/Self Care Barriers to Discharge: No Barriers Identified   Patient Goals and CMS Choice Patient states their goals for this hospitalization and ongoing recovery are:: Return home CMS Medicare.gov Compare Post Acute Care list provided to:: Patient Choice offered to / list presented to : Patient, Adult Children  Discharge Placement                       Discharge Plan and Services In-house Referral: NA Discharge Planning Services: CM Consult            DME Arranged: N/A         HH Arranged: Refused HH          Social Determinants of Health (SDOH) Interventions     Readmission Risk Interventions No flowsheet data found.

## 2019-10-28 NOTE — Consult Note (Signed)
Referring Provider: Dr. Neysa Bonito, [facility] Primary Care Physician:  Lauraine Rinne, MD Primary Gastroenterologist:  Althia Forts, Dr. Shana Chute in [facility]  Reason for Consultation:  Elevated LFT's  HPI: Monica Bauer is a 63 y.o. female with medical history significant forhypertension, breast cancer, and type 2 diabetes mellitus who presented to the emergency department for evaluation of confusion.  They are treating her for seizures and she was started on Keppra here.  While here she was incidentally noted to have mildly elevated LFT's.  Total bili and ALP normal.  AST 65 and then 71 today and ALT 77 and then 80 today.  Were normal in August 2020 with AST 17 and ALT 20.  Ultrasound RUQ normal.  Denies FH of liver disease.  Does not drink ETOH or take supplements, etc.  Follows with Dr. Shana Chute in [facility] for colonoscopy, last 12/2013 that was normal.   Past Medical History:  Diagnosis Date  . Breast cancer ([place])   . Diabetes mellitus without complication ([place])   . Hypertension     Past Surgical History:  Procedure Laterality Date  . BREAST LUMPECTOMY      Prior to Admission medications   Medication Sig Start Date End Date Taking? Authorizing Provider  amLODipine (NORVASC) 10 MG tablet Take 10 mg by mouth daily. 04/07/15 01/15/20 Yes [provider]  atorvastatin (LIPITOR) 10 MG tablet Take 10 mg by mouth every evening. 01/15/19  Yes [provider]  Cholecalciferol (VITAMIN D3 PO) Take 1,000 Units by mouth daily.    Yes [provider]  glipiZIDE (GLUCOTROL XL) 5 MG 24 hr tablet Take 5 mg by mouth daily with breakfast.   Yes [provider]  hydrochlorothiazide (HYDRODIURIL) 25 MG tablet Take 25 mg by mouth daily.   Yes [provider]  losartan (COZAAR) 100 MG tablet Take 100 mg by mouth daily.   Yes [provider]  Multiple Vitamin (MULTIVITAMIN WITH MINERALS) TABS tablet Take 1 tablet by mouth daily.   Yes [provider]   eszopiclone (LUNESTA) 1 MG TABS tablet Take 1 tablet (1 mg total) by mouth at bedtime as needed for sleep. Take immediately before bedtime Patient not taking: Reported on 10/21/2019 09/10/14   Tanna Furry, MD  GLIPIZIDE PO Take by mouth.    [provider]  HYDRALAZINE-HCTZ PO Take by mouth.    [provider]  UNKNOWN TO PATIENT Cancer med    [provider]    Current Facility-Administered Medications  Medication Dose Route Frequency Provider Last Rate Last Dose  . acetaminophen (TYLENOL) tablet 650 mg  650 mg Oral Q6H PRN Opyd, Ilene Qua, MD       Or  . acetaminophen (TYLENOL) suppository 650 mg  650 mg Rectal Q6H PRN Opyd, Ilene Qua, MD      . amLODipine (NORVASC) tablet 5 mg  5 mg Oral Daily Radene Gunning, NP   5 mg at 10/27/19 1215  . enoxaparin (LOVENOX) injection 40 mg  40 mg Subcutaneous Q24H Vann, Deaundra Dupriest U, DO   40 mg at 10/27/19 1250  . insulin aspart (novoLOG) injection 0-15 Units  0-15 Units Subcutaneous TID WC Eulogio Bear U, DO   2 Units at 10/27/19 1720  . insulin aspart (novoLOG) injection 0-5 Units  0-5 Units Subcutaneous QHS Eulogio Bear U, DO   5 Units at 10/21/19 2221  . labetalol (NORMODYNE) injection 10 mg  10 mg Intravenous Q2H PRN Opyd, Ilene Qua, MD      . levETIRAcetam (KEPPRA)  tablet 250 mg  250 mg Oral BID Lora Havens, MD   250 mg at 10/27/19 2223   Followed by  . [START ON 10/30/2019] levETIRAcetam (KEPPRA) tablet 500 mg  500 mg Oral BID Lora Havens, MD      . lip balm (CARMEX) ointment   Topical PRN Eulogio Bear U, DO      . LORazepam (ATIVAN) injection 2 mg  2 mg Intravenous PRN Lora Havens, MD      . multivitamin with minerals tablet 1 tablet  1 tablet Oral Daily Eulogio Bear U, DO   1 tablet at 10/27/19 1214  . ondansetron (ZOFRAN) tablet 4 mg  4 mg Oral Q6H PRN Opyd, Ilene Qua, MD       Or  . ondansetron (ZOFRAN) injection 4 mg  4 mg Intravenous Q6H PRN Opyd, Ilene Qua, MD      . polyethylene glycol  (MIRALAX / GLYCOLAX) packet 17 g  17 g Oral Daily PRN Opyd, Ilene Qua, MD      . sodium chloride flush (NS) 0.9 % injection 3 mL  3 mL Intravenous Q12H Opyd, Ilene Qua, MD   3 mL at 10/27/19 2222    Allergies as of 10/17/2019 - Review Complete 10/17/2019  Allergen Reaction Noted  . Other  09/10/2014    History reviewed. No pertinent family history.  Social History   Socioeconomic History  . Marital status: Single    Spouse name: Not on file  . Number of children: Not on file  . Years of education: Not on file  . Highest education level: Not on file  Occupational History  . Not on file  Social Needs  . Financial resource strain: Not on file  . Food insecurity    Worry: Not on file    Inability: Not on file  . Transportation needs    Medical: Not on file    Non-medical: Not on file  Tobacco Use  . Smoking status: Former Research scientist (life sciences)  . Smokeless tobacco: Never Used  Substance and Sexual Activity  . Alcohol use: No  . Drug use: Not on file  . Sexual activity: Not on file  Lifestyle  . Physical activity    Days per week: Not on file    Minutes per session: Not on file  . Stress: Not on file  Relationships  . Social Herbalist on phone: Not on file    Gets together: Not on file    Attends religious service: Not on file    Active member of club or organization: Not on file    Attends meetings of clubs or organizations: Not on file    Relationship status: Not on file  . Intimate partner violence    Fear of current or ex partner: Not on file    Emotionally abused: Not on file    Physically abused: Not on file    Forced sexual activity: Not on file  Other Topics Concern  . Not on file  Social History Narrative  . Not on file    Review of Systems: ROS is O/W negative except as mentioned in HPI.  Physical Exam: Vital signs in last 24 hours: Temp:  [98.1 F (36.7 C)-98.8 F (37.1 C)] 98.1 F (36.7 C) (11/30 0640) Pulse Rate:  [64-74] 74 (11/30 0640) Resp:   [16-20] 16 (11/30 0640) BP: (117-141)/(65-88) 129/88 (11/30 0640) SpO2:  [95 %-100 %] 95 % (11/30 0640) Weight:  [87.8 kg] 87.8 kg (11/30  74) Last BM Date: 10/22/19(pt is unsure of when last bm was) General:  Alert, Well-developed, well-nourished, pleasant and cooperative in NAD Head:  Normocephalic and atraumatic. Eyes:  Sclera clear, no icterus.  Conjunctiva pink. Ears:  Normal auditory acuity. Mouth:  No deformity or lesions.   Lungs:  Clear throughout to auscultation.  No wheezes, crackles, or rhonchi.  Heart:  Regular rate and rhythm; no murmurs, clicks, rubs, or gallops. Abdomen:  Soft, non-distended.  BS present.  Non-tender.   Msk:  Symmetrical without gross deformities. Pulses:  Normal pulses noted. Extremities:  Without clubbing or edema. Neurologic:  Alert and oriented x 4;  grossly normal neurologically. Skin:  Intact without significant lesions or rashes. Psych:  Alert and cooperative. Normal mood and affect.  Intake/Output from previous day: 11/29 0701 - 11/30 0700 In: 65 [P.O.:600; I.V.:3] Out: -   Lab Results: Recent Labs    10/27/19 0920 10/28/19 0257  WBC 8.6 9.7  HGB 12.6 12.3  HCT 37.6 37.3  PLT 265 298   BMET Recent Labs    10/27/19 0920 10/28/19 0257  NA 141 140  K 3.6 3.8  CL 108 104  CO2 21* 23  GLUCOSE 105* 87  BUN 15 15  CREATININE 1.00 1.10*  CALCIUM 9.4 9.2   LFT Recent Labs    10/28/19 0257  PROT 6.4*  ALBUMIN 3.1*  AST 71*  ALT 80*  ALKPHOS 53  BILITOT 0.6   Studies/Results: US Abdomen Limited Ruq  Result Date: 10/27/2019 CLINICAL DATA:  Elevated LFTs. EXAM: ULTRASOUND ABDOMEN LIMITED RIGHT UPPER QUADRANT COMPARISON:  None. FINDINGS: Gallbladder: No gallstones or wall thickening visualized. No sonographic Murphy sign noted by sonographer. Common bile duct: Diameter: 2.1 mm Liver: No focal lesion identified. Within normal limits in parenchymal echogenicity. Portal vein is patent on color Doppler imaging with normal  direction of blood flow towards the liver. Other: None. IMPRESSION: Normal study.  No cause for LFT elevation identified. Electronically Signed   By: Dorise Bullion III M.D   On: 10/27/2019 19:25    IMPRESSION:  -Elevated LFT's:  ALT and AST elevated at 80 and 71 respectively.  Ultrasound normal.  Normal in August 2020.  Hepatitis panel pending.  I checked all of her meds including Arimidex that she is on for history of breast cancer and her Keppra, which is new here, but no highly suspicious meds that I have been able to find except for maybe Lipitor?? But she has not continued that as inpatient.  PLAN: -Trend LFT's. -Await results of hepatitis panel. -Will order ANA, AMA, ASMA, IgG for now.  Will see if Dr. Havery Moros would like any other serologies performed as well.   -This can likely all be followed up as an outpatient with her GI MD, Dr. Shana Chute, in [facility] as long as they do not increase significantly while inpatient.   Laban Emperor. Delesha Pohlman  10/28/2019, 9:48 AM

## 2019-10-28 NOTE — Progress Notes (Signed)
Nsg Discharge Note  Admit Date:  10/17/2019 Discharge date: 10/28/2019   [place] Go to be D/C'd Home per MD order.  AVS completed.   Patient/caregiver able to verbalize understanding.  Discharge Medication: Allergies as of 10/28/2019      Reactions   Other    Pt does not know med allergy      Medication List    STOP taking these medications   atorvastatin 10 MG tablet Commonly known as: LIPITOR   eszopiclone 1 MG Tabs tablet Commonly known as: Lunesta   HYDRALAZINE-HCTZ PO   hydrochlorothiazide 25 MG tablet Commonly known as: HYDRODIURIL   losartan 100 MG tablet Commonly known as: COZAAR   UNKNOWN TO PATIENT     TAKE these medications   amLODipine 10 MG tablet Commonly known as: NORVASC Take 10 mg by mouth daily.   glipiZIDE 5 MG 24 hr tablet Commonly known as: GLUCOTROL XL Take 5 mg by mouth daily with breakfast. What changed: Another medication with the same name was removed. Continue taking this medication, and follow the directions you see here.   levETIRAcetam 250 MG tablet Commonly known as: KEPPRA Take 1 tablet (250 mg total) by mouth 2 (two) times daily for 1 day.   levETIRAcetam 500 MG tablet Commonly known as: KEPPRA Take 1 tablet (500 mg total) by mouth 2 (two) times daily. Start taking on: October 30, 2019   multivitamin with minerals Tabs tablet Take 1 tablet by mouth daily.   VITAMIN D3 PO Take 1,000 Units by mouth daily.       Discharge Assessment: Vitals:   10/28/19 0640 10/28/19 1339  BP: 129/88 128/82  Pulse: 74 65  Resp: 16 18  Temp: 98.1 F (36.7 C) 98.6 F (37 C)  SpO2: 95% 100%   Skin clean, dry and intact without evidence of skin break down, no evidence of skin tears noted. IV catheter discontinued intact. Site without signs and symptoms of complications - no redness or edema noted at insertion site, patient denies c/o pain - only slight tenderness at site.  Dressing with slight pressure applied.  D/c  Instructions-Education: Discharge instructions given to patient/family with verbalized understanding. D/c education completed with patient/family including follow up instructions, medication list, d/c activities limitations if indicated, with other d/c instructions as indicated by MD - patient able to verbalize understanding, all questions fully answered. Patient instructed to return to ED, call 911, or call MD for any changes in condition.  Patient escorted via [place], and D/C home via private auto.  Hassan Rowan, RN 10/28/2019 2:13 PM

## 2019-10-28 NOTE — Discharge Summary (Addendum)
Physician Discharge Summary  Monica Bauer [id number] DOB: 14-Jul-1956 DOA: 10/17/2019  PCP: Thornton Dales I, MD  Admit date: 10/17/2019 Discharge date: 10/28/2019   Code Status: Full Code  Admitted From: Home Discharged to: Roundup: No Equipment/Devices: None Discharge Condition: Stable  Recommendations for Outpatient Follow-up   Follow up with PCP on 12/3 Please follow up CMP/CBC  Schedule an appointment with [facility] neurology for encephalopathy concern for possible seizures, seen inpatient -ambulatory referral placed If patient has increasing LFTs then she should follow-up with her GI doc, Dr. Shana Chute, at [facility]  Hepatitis panel and other autoimmune antibodies ordered by GI, pending at discharge  [place] is an 63 y.o. female with medical history significant for hypertension, breast cancer, and type 2 diabetes mellitus, presenting to the emergency department for evaluation of confusion.  Patient was brought in to the ED by her son who has been concerned for waxing and waning confusion over the 3 to 4 days eating to admission.  Patient seemed to be in her usual state of health until she seemed to be slightly confused on 10/13/2019.  She has seemed to be lucid at times, but overall seems to be worsening, not caring for herself, and calling family in the middle of the night with marked confusion and garbled speech.  Patient is normally alert and fully oriented, manages all of her ADLs, and also cares for her elderly mother.   Reviewed chart further: 12/14 seen at [facility]- had MRI for memory issues 10/15 was at [facility]- seen by ER doc-- diagnosed with TGA   She was seen by neurology and had an extensive work-up including LP and EEG.  None captured on EEG so dementia/sleep disorder is on the differential.  Initially thought to be HSV meningitis but LP was not tolerated.  Patient was treated empirically with acyclovir for total of 8 days.  Throughout  hospitalization continued to have intermittent episodes of mental status changes which seemed to improve on 11/29 and resolved on 11/30.  Patient is to be referred to outpatient [facility] neurology for follow-up.  Of note, patient was noted to have minimally elevated LFTs and had a negative right upper quadrant ultrasound.  This slightly increased on 11/30.  GI was consulted and believe this to be medication induced.  Statin was discontinued.  Repeat CMP ordered and to be followed up with PCP on 12/3.  If she has worsening LFTs then consider setting up an appointment with her GI doctor at [facility].  (See below for further details)    A & P   Principal Problem:   Acute encephalopathy Active Problems:   Hypertension   Diabetes mellitus without complication (HCC)   Mild renal insufficiency   Breast cancer (HCC)   Elevated vitamin B12 level   AMS (altered mental status)   Acute encephalopathy-  Most likely medication induced (Acyclovir, Lunesta) in setting of AKI Encephalopathy and lipsmacking resolved. Initial concern for seizures vs infection vs worsening dementia however negative workup and treatment as below.  -Presented 11/19 with 3-4 days of waxing and waning confusion, not caring for self or her 66 yo mother with whom she resides -No acute findings noted on head CT or MRI brain. -chart review: 12/14 seen at [facility]- had MRI for memory issues 10/15 was at [facility]- seen by ER doc-- diagnosed with TGA - Neurology evaluated: broad differential diagnosis-- seizures vs infection vs worsening dementia-- LP unsuccessful at bedside and with IR - previous hospitalist spoke with  ID;  no clear evidence of HSV meningitis-- no EEG changes doubt this diagnosis and hence why not treating for 21 days -B1 normal -RPR and UDS- negative  -LTM EEG: no seizures but patient did not sleep much, patient told neurology that she has OSA but did not like the CPAP so she does not use -has had several seizure like  episodes in the hospital but we were not able to catch on EEG -Lunesta on home med list which was held at discharge -Initially empiric acyclovir for 7 days and therapy was lengthened until 12/1 (total 11 days), however patient is currently afebrile and leukocytosis has resolved and acyclovir can cause AMS especially in setting of AKI which the patient had. Acyclovir was discontinued on 11/29 and had resolution of symptoms on 11/30 -started keppra 250 bid x 1 week and increase to 500mg  (start 12/2) -Referred to [facility] neurology for outpatient follow-up   Mildly Elevated LFTs of unknown etiology, likely medication induced but unknown which medication Asymptomatic negative right upper quadrant ultrasound.  AST/ALT 65/77-> 71/80.  Lipitor held.  GI consulted due to increased trend and agree that this is medication induced and recommend outpatient follow-up with patient's gastroenterologist at [facility] if LFTs continue to trend upward as outpatient. -CMP in 2 days prior to PCP appointment 12/3 -May be from [place] and should be followed up outpatient    Elevated B12 -? Etiology, possibly liver disease with elevated LFTs -does not have B12 on home meds  -outpatient follow up   H/o breast cancer 2013 Continue arimidex   Type II DM  - A1c 6.7%   -holding oral agents -SSI for optimal control    AKI Renal function at/near baseline follow-up outpatient -Losartan held at discharge   Hypertension  -On amlodipine monotherapy at discharge, other home meds discontinued pending renal function   Hypokalemia resolved   obesity Body mass index is 32.22 kg/m.    Consultants  GI Neuro  Procedures  EEG LP (patient did not tolerate, not completed)  Antibiotics   Anti-infectives (From admission, onward)    Start     Dose/Rate Route Frequency Ordered Stop   10/26/19 1000  acyclovir (ZOVIRAX) 800 mg in dextrose 5 % 150 mL IVPB  Status:  Discontinued     10 mg/kg  79.9 kg 166 mL/hr  over 60 Minutes Intravenous Every 12 hours 10/26/19 0813 10/26/19 0813   10/26/19 1000  acyclovir (ZOVIRAX) 620 mg in dextrose 5 % 100 mL IVPB  Status:  Discontinued     620 mg 112.4 mL/hr over 60 Minutes Intravenous Every 12 hours 10/26/19 0813 10/27/19 0717   10/22/19 1800  acyclovir (ZOVIRAX) 620 mg in dextrose 5 % 100 mL IVPB     10 mg/kg  62 kg (Adjusted) 112.4 mL/hr over 60 Minutes Intravenous Every 8 hours 10/22/19 1242 10/25/19 0145   10/19/19 2100  acyclovir (ZOVIRAX) 660 mg in dextrose 5 % 100 mL IVPB  Status:  Discontinued     10 mg/kg  66.2 kg (Adjusted) 113.2 mL/hr over 60 Minutes Intravenous Every 12 hours 10/19/19 1022 10/22/19 1242   10/18/19 1000  acyclovir (ZOVIRAX) 660 mg in dextrose 5 % 100 mL IVPB  Status:  Discontinued     10 mg/kg  66.2 kg (Adjusted) 113.2 mL/hr over 60 Minutes Intravenous Every 8 hours 10/18/19 0834 10/19/19 1022   10/18/19 0800  acyclovir (ZOVIRAX) 750 mg in dextrose 5 % 150 mL IVPB  Status:  Discontinued     750 mg  165 mL/hr over 60 Minutes Intravenous Every 8 hours 10/17/19 2349 10/18/19 0833   10/18/19 0030  acyclovir (ZOVIRAX) 750 mg in dextrose 5 % 150 mL IVPB     750 mg 165 mL/hr over 60 Minutes Intravenous  Once 10/17/19 2349 10/18/19 0300         Subjective  Patient seen and examined at bedside no acute distress and resting comfortably.  No events overnight.  Tolerating diet. In good spirits and anticipating discharge.   Denies any chest pain, shortness of breath, fever, nausea, vomiting,abdominal pain. Otherwise ROS negative   Objective   Discharge Exam: Vitals:   10/28/19 0640 10/28/19 1339  BP: 129/88 128/82  Pulse: 74 65  Resp: 16 18  Temp: 98.1 F (36.7 C) 98.6 F (37 C)  SpO2: 95% 100%   Vitals:   10/27/19 2126 10/28/19 0606 10/28/19 0640 10/28/19 1339  BP: 127/65  129/88 128/82  Pulse: 66  74 65  Resp: 16  16 18   Temp: 98.6 F (37 C)  98.1 F (36.7 C) 98.6 F (37 C)  TempSrc: Oral  Oral Oral  SpO2: 97%   95% 100%  Weight:  87.8 kg    Height:        Physical Exam Vitals signs and nursing note reviewed.  Constitutional:      Appearance: She is obese.  HENT:     Head: Normocephalic and atraumatic.     Nose: Nose normal.     Mouth/Throat:     Mouth: Mucous membranes are moist.  Eyes:     Extraocular Movements: Extraocular movements intact.  Neck:     Musculoskeletal: Normal range of motion. No neck rigidity.  Cardiovascular:     Rate and Rhythm: Normal rate and regular rhythm.  Pulmonary:     Effort: Pulmonary effort is normal.     Breath sounds: Normal breath sounds.  Abdominal:     General: Abdomen is flat. There is no distension.     Palpations: Abdomen is soft.     Tenderness: There is no abdominal tenderness.  Musculoskeletal: Normal range of motion.        General: No swelling.  Neurological:     Mental Status: She is alert and oriented to person, place, and time. Mental status is at baseline.  Psychiatric:        Mood and Affect: Mood normal.        Behavior: Behavior normal.        Thought Content: Thought content normal.        Judgment: Judgment normal.       The results of significant diagnostics from this hospitalization (including imaging, microbiology, ancillary and laboratory) are listed below for reference.     Microbiology: No results found for this or any previous visit (from the past 240 hour(s)).   Labs: BNP (last 3 results) No results for input(s): BNP in the last 8760 hours. Basic Metabolic Panel: Recent Labs  Lab 10/22/19 0403 10/23/19 0508 10/25/19 0225 10/27/19 0920 10/28/19 0257  NA 141 142  --  141 140  K 3.2* 3.7  --  3.6 3.8  CL 106 108  --  108 104  CO2 23 22  --  21* 23  GLUCOSE 93 104*  --  105* 87  BUN 12 13  --  15 15  CREATININE 1.01* 1.08* 1.03* 1.00 1.10*  CALCIUM 9.3 9.4  --  9.4 9.2   Liver Function Tests: Recent Labs  Lab 10/27/19 0920 10/28/19 0257  AST 65* 71*  ALT 77* 80*  ALKPHOS 54 53  BILITOT 0.8 0.6   PROT 6.9 6.4*  ALBUMIN 3.2* 3.1*   No results for input(s): LIPASE, AMYLASE in the last 168 hours. Recent Labs  Lab 10/27/19 1432  AMMONIA 31   CBC: Recent Labs  Lab 10/22/19 0403 10/23/19 0508 10/27/19 0920 10/28/19 0257  WBC 11.6* 10.9* 8.6 9.7  HGB 12.3 12.8 12.6 12.3  HCT 36.3 38.4 37.6 37.3  MCV 84.6 85.1 84.3 85.7  PLT 264 250 265 298   Cardiac Enzymes: No results for input(s): CKTOTAL, CKMB, CKMBINDEX, TROPONINI in the last 168 hours. BNP: Invalid input(s): POCBNP CBG: Recent Labs  Lab 10/27/19 1139 10/27/19 1654 10/27/19 2124 10/28/19 0815 10/28/19 1200  GLUCAP 111* 139* 98 95 132*   D-Dimer No results for input(s): DDIMER in the last 72 hours. Hgb A1c No results for input(s): HGBA1C in the last 72 hours. Lipid Profile No results for input(s): CHOL, HDL, LDLCALC, TRIG, CHOLHDL, LDLDIRECT in the last 72 hours. Thyroid function studies No results for input(s): TSH, T4TOTAL, T3FREE, THYROIDAB in the last 72 hours.  Invalid input(s): FREET3 Anemia work up No results for input(s): VITAMINB12, FOLATE, FERRITIN, TIBC, IRON, RETICCTPCT in the last 72 hours. Urinalysis    Component Value Date/Time   COLORURINE YELLOW 10/17/2019 2120   APPEARANCEUR CLEAR 10/17/2019 2120   LABSPEC 1.028 10/17/2019 2120   PHURINE 5.0 10/17/2019 2120   [place] 10/17/2019 2120   HGBUR NEGATIVE 10/17/2019 2120   BILIRUBINUR NEGATIVE 10/17/2019 2120   KETONESUR 80 (A) 10/17/2019 2120   PROTEINUR 100 (A) 10/17/2019 2120   UROBILINOGEN 0.2 11/21/2014 1504   NITRITE NEGATIVE 10/17/2019 2120   LEUKOCYTESUR NEGATIVE 10/17/2019 2120   Sepsis Labs Invalid input(s): PROCALCITONIN,  WBC,  LACTICIDVEN Microbiology No results found for this or any previous visit (from the past 240 hour(s)).  Discharge Instructions     Discharge Instructions     Ambulatory referral to Neurology   Complete by: As directed    An appointment is requested in approximately: 4 weeks    Diet - low sodium heart healthy   Complete by: As directed    Discharge instructions   Complete by: As directed    You were seen and examined in the hospital for altered mental status and cared for by a hospitalist.   Upon Discharge:  -Do not drive for 6 months per [facility] law as there is concern for seizure activity -Recommend follow-up with [facility] neurology for outpatient work-up in regards to dementia, sleep disorder, possible epilepsy -Stop taking hydralazine, hydrochlorothiazide, losartan, atorvastatin, Lunesta until you follow-up with your primary care physician with repeat lab work -Start taking Keppra 250 mg twice daily for the next 1 day followed by 500 mg twice daily starting 12/2. -Follow-up with neurology outpatient Make an appointment with your primary care physician within 7 days Get lab work prior to your follow up appointment with your PCP Bring all home medications to your appointment to review Request that your primary physician go over all hospital tests and procedures/radiological results at the follow up.   Please get all hospital records sent to your physician by signing a hospital release before you go home.     Read the complete instructions along with all the possible side effects for all the medicines you take and that have been prescribed to you. Take any new medicines after you have completely understood and accept all the possible adverse reactions/side effects.  If you have any questions about your discharge medications or the care you received while you were in the hospital, you can call the unit and asked to speak with the hospitalist on call. Once you are discharged, your primary care physician will handle any further medical issues. Please note that NO REFILLS for any discharge medications will be authorized, as it is imperative that you return to your primary care physician (or establish a relationship with a primary care physician if you do  not have one) for your aftercare needs so that they can reassess your need for medications and monitor your lab values.   Do not drive, operate heavy machinery, perform activities at heights, swimming or participation in water activities or provide baby sitting services if your were admitted for loss of consciousness/seizures or if you are on sedating medications including, but not limited to benzodiazepines, sleep medications, narcotic pain medications, etc., until you have been cleared to do so by a medical doctor.    Do not take more than prescribed medications.   Wear a seat belt while driving.  If you have smoked or chewed Tobacco  in the last 2 years please stop smoking; also stop any regular Alcohol and/or any Recreational drug use including marijuana.  If you experience worsening of your admission symptoms or develop shortness of breath, chest pain, suicidal or homicidal thoughts or experience a life threatening emergency, you must seek medical attention immediately by calling 911 or calling your PCP immediately.   Increase activity slowly   Complete by: As directed       Allergies as of 10/28/2019       Reactions   Other    Pt does not know med allergy        Medication List     STOP taking these medications    atorvastatin 10 MG tablet Commonly known as: LIPITOR   eszopiclone 1 MG Tabs tablet Commonly known as: Lunesta   HYDRALAZINE-HCTZ PO   hydrochlorothiazide 25 MG tablet Commonly known as: HYDRODIURIL   losartan 100 MG tablet Commonly known as: COZAAR   UNKNOWN TO PATIENT       TAKE these medications    amLODipine 10 MG tablet Commonly known as: NORVASC Take 10 mg by mouth daily.   glipiZIDE 5 MG 24 hr tablet Commonly known as: GLUCOTROL XL Take 5 mg by mouth daily with breakfast. What changed: Another medication with the same name was removed. Continue taking this medication, and follow the directions you see here.   levETIRAcetam 250 MG  tablet Commonly known as: KEPPRA Take 1 tablet (250 mg total) by mouth 2 (two) times daily for 1 day.   levETIRAcetam 500 MG tablet Commonly known as: KEPPRA Take 1 tablet (500 mg total) by mouth 2 (two) times daily. Start taking on: October 30, 2019   multivitamin with minerals Tabs tablet Take 1 tablet by mouth daily.   VITAMIN D3 PO Take 1,000 Units by mouth daily.         Allergies  Allergen Reactions   Other     Pt does not know med allergy    Time coordinating discharge: Over 30 minutes   SIGNED:   Harold Hedge, D.O. [facility] Pager: [phone]  10/28/2019, 1:40 PM

## 2019-10-29 LAB — MITOCHONDRIAL ANTIBODIES: Mitochondrial M2 Ab, IgG: <20 Units (ref 0.0–20.0)

## 2019-10-29 LAB — IGG: IgG (Immunoglobin G), Serum: 1113 mg/dL (ref 586–1602)

## 2019-10-29 LAB — ANTI-SMOOTH MUSCLE ANTIBODY, IGG: F-Actin IgG: 12 Units (ref 0–19)

## 2019-10-30 LAB — ANTINUCLEAR ANTIBODIES, IFA: ANA Ab, IFA: NEGATIVE

## 2019-12-24 ENCOUNTER — Encounter: Payer: Self-pay | Admitting: Diagnostic Neuroimaging

## 2019-12-24 ENCOUNTER — Ambulatory Visit: Payer: Medicare Other | Admitting: Diagnostic Neuroimaging

## 2019-12-24 ENCOUNTER — Telehealth: Payer: Self-pay | Admitting: *Deleted

## 2019-12-24 NOTE — Telephone Encounter (Signed)
Patient was no show for new patient appointment today. 

## 2021-12-29 LAB — HM MAMMOGRAPHY

## 2022-03-07 ENCOUNTER — Emergency Department (HOSPITAL_BASED_OUTPATIENT_CLINIC_OR_DEPARTMENT_OTHER)
Admission: EM | Admit: 2022-03-07 | Discharge: 2022-03-07 | Disposition: A | Discharge status: Home / Self Care | Payer: Medicare Other | Attending: Emergency Medicine | Admitting: Emergency Medicine

## 2022-03-07 ENCOUNTER — Encounter (HOSPITAL_BASED_OUTPATIENT_CLINIC_OR_DEPARTMENT_OTHER): Payer: Self-pay | Admitting: Emergency Medicine

## 2022-03-07 ENCOUNTER — Other Ambulatory Visit (HOSPITAL_BASED_OUTPATIENT_CLINIC_OR_DEPARTMENT_OTHER): Payer: Self-pay

## 2022-03-07 ENCOUNTER — Other Ambulatory Visit: Payer: Self-pay

## 2022-03-07 ENCOUNTER — Emergency Department (HOSPITAL_BASED_OUTPATIENT_CLINIC_OR_DEPARTMENT_OTHER): Payer: Medicare Other

## 2022-03-07 DIAGNOSIS — Z853 Personal history of malignant neoplasm of breast: Secondary | ICD-10-CM | POA: Diagnosis not present

## 2022-03-07 DIAGNOSIS — E119 Type 2 diabetes mellitus without complications: Secondary | ICD-10-CM | POA: Insufficient documentation

## 2022-03-07 DIAGNOSIS — Z7984 Long term (current) use of oral hypoglycemic drugs: Secondary | ICD-10-CM | POA: Insufficient documentation

## 2022-03-07 DIAGNOSIS — Z79899 Other long term (current) drug therapy: Secondary | ICD-10-CM | POA: Diagnosis not present

## 2022-03-07 DIAGNOSIS — M545 Low back pain, unspecified: Secondary | ICD-10-CM | POA: Insufficient documentation

## 2022-03-07 DIAGNOSIS — I10 Essential (primary) hypertension: Secondary | ICD-10-CM | POA: Diagnosis not present

## 2022-03-07 DIAGNOSIS — X500XXA Overexertion from strenuous movement or load, initial encounter: Secondary | ICD-10-CM | POA: Insufficient documentation

## 2022-03-07 IMAGING — CR DG LUMBAR SPINE COMPLETE 4+V
5 series · 5 of 5 positions shown · non-contrast
Comparison: None.

CLINICAL DATA: Right lumbar back pain.

EXAM:
LUMBAR SPINE - COMPLETE 4+ VIEW

[t l-spine a.p.]
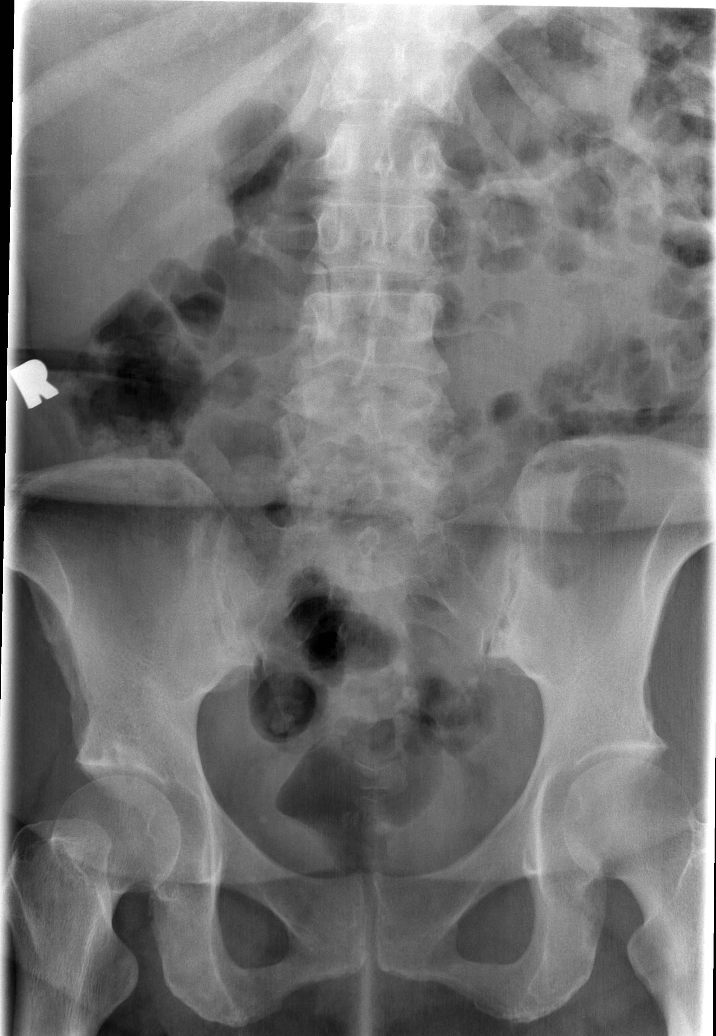

[t l-spine oblique exposure (1 of 2)]
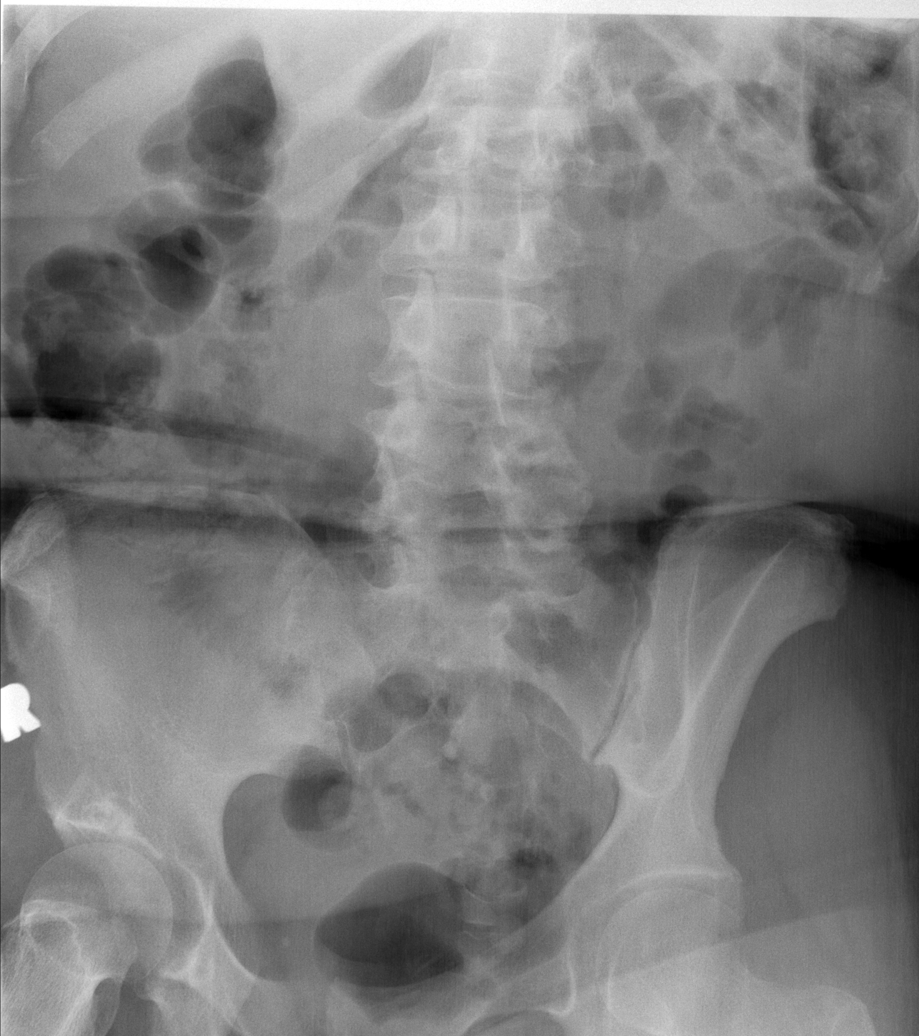

[t l-spine oblique exposure (2 of 2)]
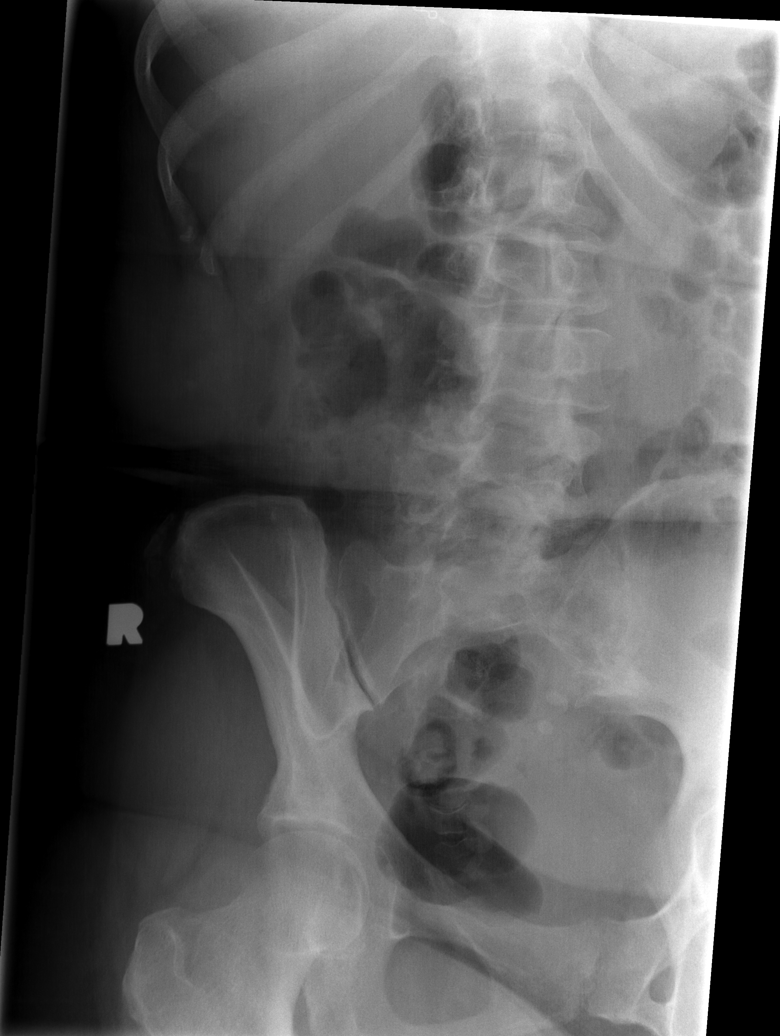

[t l-spine lat]
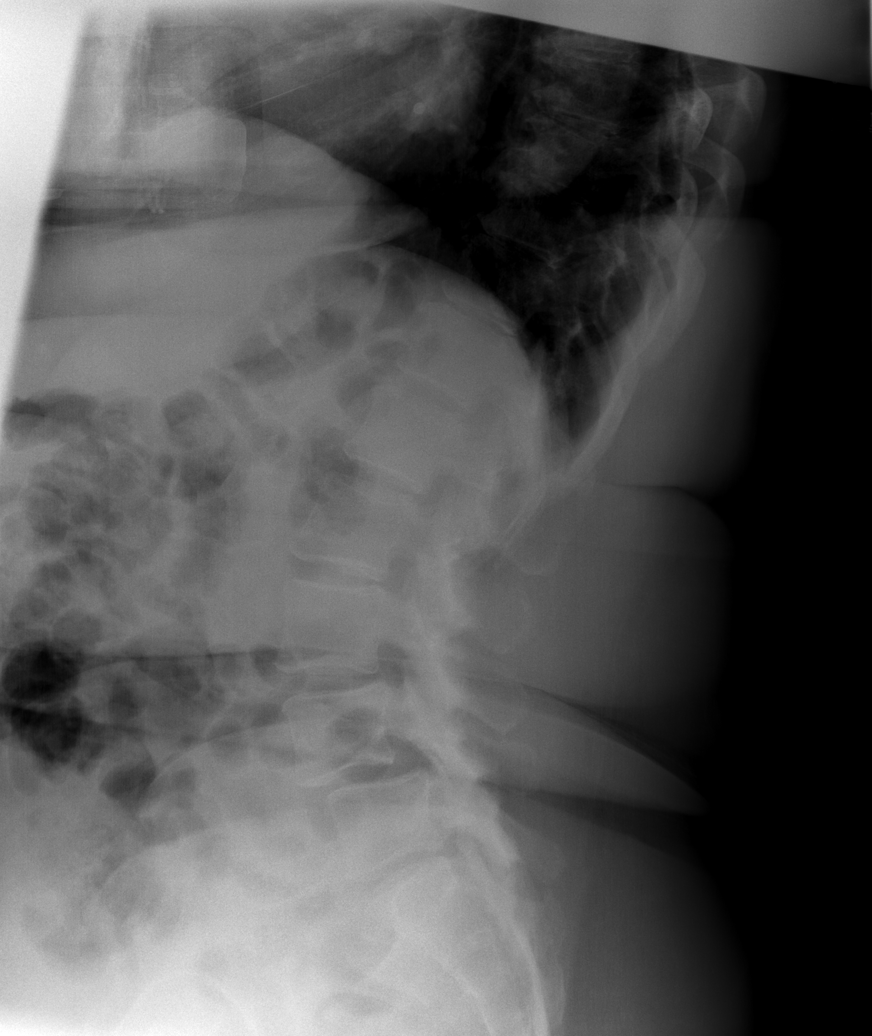

[t l-spine l5-s1 spot]
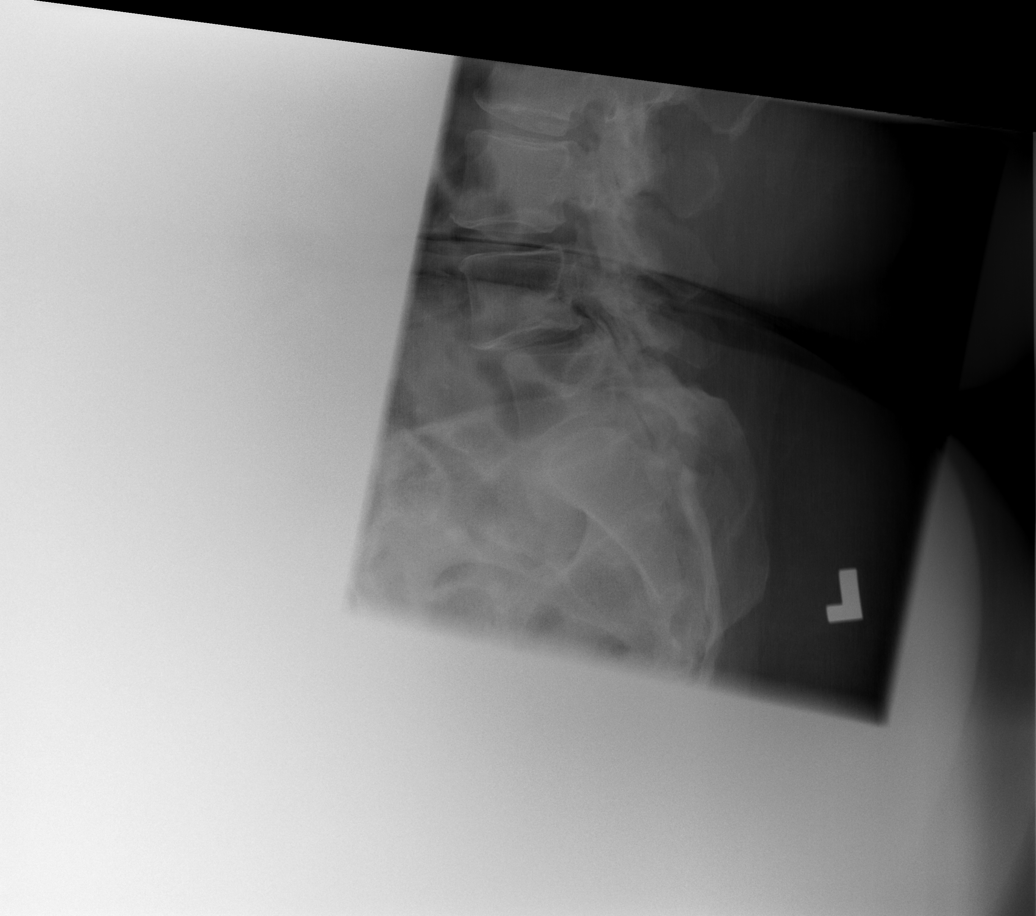

[5 of 5 positions shown; findings below may reference images not displayed]

FINDINGS: Four view study. No evidence for an acute fracture. Grade 1-2
anterolisthesis of L4 on 5 noted. Loss of disc height noted L4-5 and
L5-S1 SI joints are unremarkable.
IMPRESSION: Degenerative disc disease at L4-5 and L5-S1 with grade 1-2
anterolisthesis of L4 on L5.

## 2022-03-07 MED ORDER — LIDOCAINE 5 % EX PTCH
1.0000 | MEDICATED_PATCH | CUTANEOUS | Status: DC
  Administered 2022-03-07: 1 via TRANSDERMAL
  Filled 2022-03-07: qty 1

## 2022-03-07 MED ORDER — LIDOCAINE 4 % EX PTCH
1.0000 | MEDICATED_PATCH | Freq: Two times a day (BID) | CUTANEOUS | 0 refills | Status: DC | PRN
  Filled 2022-03-07: qty 30, fill #0

## 2022-03-07 NOTE — ED Provider Notes (Signed)
?[facility] EMERGENCY DEPARTMENT ?Provider Note ? ? ?CSN: [id number] ?Arrival date & time: 03/07/22  1400 ? ?  ? ?History ? ?Chief Complaint  ?Patient presents with  ? Back Pain  ? ? ?Monica Bauer is a 66 y.o. 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   ? ?Presents to the emergency department with a chief complaint of right-sided lumbar back pain.  Patient states that pain has been present intermittently over the last week.  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  Patient now has pain with certain movements and touch.  Patient reports that pain will improve if she sits down and relaxes.  Patient has not tried any modalities to alleviate her symptoms.   ? ?Patient denies any recent falls or injuries.  Denies any IV drug use. ? ?Patient denies any fever, chills, numbness, weakness, saddle anesthesia, bowel/bladder dysfunction, dysuria, hematuria, urinary urgency, urinary frequency ? ? ?Back Pain ?Associated symptoms: no abdominal pain, no chest pain, no dysuria, no fever, no headaches, no numbness and no weakness   ? ?  ? ?Home Medications ?Prior to Admission medications   ?Medication Sig Start Date End Date Taking? Authorizing Provider  ?amLODipine (NORVASC) 10 MG tablet Take 10 mg by mouth daily. 04/07/15 01/15/20  [provider]  ?Cholecalciferol (VITAMIN D3 PO) Take 1,000 Units by mouth daily.     [provider]  ?glipiZIDE (GLUCOTROL XL) 5 MG 24 hr tablet Take 5 mg by mouth daily with breakfast.    [provider]  ?levETIRAcetam (KEPPRA) 250 MG tablet Take 1 tablet (250 mg total) by mouth 2 (two) times daily for 1 day. 10/28/19 10/29/19  Harold Hedge, MD  ?levETIRAcetam (KEPPRA) 500 MG tablet Take 1 tablet (500 mg total) by mouth 2 (two) times daily. 10/30/19 11/29/19   Harold Hedge, MD  ?Multiple Vitamin (MULTIVITAMIN WITH MINERALS) TABS tablet Take 1 tablet by mouth daily.    [provider]  ?   ? ?Allergies    ?Other   ? ?Review of Systems   ?Review of Systems  ?Constitutional:  Negative for chills and fever.  ?Eyes:  Negative for visual disturbance.  ?Respiratory:  Negative for shortness of breath.   ?Cardiovascular:  Negative for chest pain.  ?Gastrointestinal:  Negative for abdominal pain, nausea and vomiting.  ?Genitourinary:  Negative for difficulty urinating, dysuria, frequency, hematuria, urgency, vaginal bleeding, vaginal discharge and vaginal pain.  ?Musculoskeletal:  Positive for back pain. Negative for neck pain.  ?Skin:  Negative for color change and rash.  ?Neurological:  Negative for dizziness, syncope, weakness, light-headedness, numbness and headaches.  ?Psychiatric/Behavioral:  Negative for confusion.   ? ?Physical Exam ?Updated Vital Signs ?BP (!) 164/88   Pulse 92   Temp 98.7 ?F (37.1 ?C) (Oral)   Resp 18   Ht '5\' 2"'$  (1.575 m)   Wt 90.7 kg   SpO2 100%   BMI 36.58 kg/m?  ?Physical Exam ?Vitals and nursing note reviewed.  ?Constitutional:   ?   General: She is not in acute distress. ?   Appearance: She is not ill-appearing, toxic-appearing or diaphoretic.  ?HENT:  ?   Head: Normocephalic.  ?Eyes:  ?   General: No scleral icterus.    ?   Right eye: No discharge.     ?   Left eye: No discharge.  ?Cardiovascular:  ?  Rate and Rhythm: Normal rate.  ?Pulmonary:  ?   Effort: Pulmonary effort is normal.  ?Musculoskeletal:  ?   Cervical back: No swelling, edema, deformity, erythema, signs of trauma, lacerations, rigidity, spasms, torticollis, tenderness, bony tenderness or crepitus. No pain with movement. Normal range of motion.  ?   Thoracic back: No swelling, edema, deformity, signs of trauma, lacerations, spasms, tenderness or bony tenderness.  ?   Lumbar back: Tenderness present. No swelling, edema, deformity, signs of trauma, lacerations or bony  tenderness.  ?     Back: ? ?   Comments: Tenderness to right lumbar paraspinous muscle.  ?Skin: ?   General: Skin is warm and dry.  ?Neurological:  ?   General: No focal deficit present.  ?   Mental Status: She is alert and oriented to person, place, and time.  ?   GCS: GCS eye subscore is 4. GCS verbal subscore is 5. GCS motor subscore is 6.  ?   Comments: +5 strength to bilateral upper and lower extremities.  Patient able to stand and ambulate without difficulty.  Sensation to light touch grossly intact to bilateral upper and lower extremities.  ?Psychiatric:     ?   Behavior: Behavior is cooperative.  ? ? ?ED Results / Procedures / Treatments   ?Labs ?(all labs ordered are listed, but only abnormal results are displayed) ?Labs Reviewed - No data to display ? ?EKG ?None ? ?Radiology ?DG Lumbar Spine Complete ? ?Result Date: 03/07/2022 ?CLINICAL DATA:  Right lumbar back pain. EXAM: LUMBAR SPINE - COMPLETE 4+ VIEW COMPARISON:  None. FINDINGS: Four view study. No evidence for an acute fracture. Grade 1-2 anterolisthesis of L4 on 5 noted. Loss of disc height noted L4-5 and L5-S1 SI joints are unremarkable. 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 Electronically Signed   By: Misty Stanley M.D.   On: 03/07/2022 15:32   ? ?Procedures ?Procedures  ? ? ?Medications Ordered in ED ?Medications  ?lidocaine (LIDODERM) 5 % 1 patch (1 patch Transdermal Patch Applied 03/07/22 1518)  ? ? ?ED Course/ Medical Decision Making/ A&P ?  ?                        ?Medical Decision Making ?Amount and/or Complexity of Data Reviewed ?Radiology: ordered. ? ?Risk ?OTC drugs. ?Prescription drug management. ? ? ?Alert 66 year old female in no acute distress, nontoxic-appearing.  Presents to the emergency department with a chief complaint of right lumbar back pain. ? ?Information obtained from patient.  Past medical records were reviewed including previous provider notes, labs, and imaging.   Patient has medical history as outlined in HPI which complicates her care. ? ?Patient denies any recent falls or injuries.  Denies any IV drug use.  Pain started after lifting her mother.  Pain is worse with certain movements and touch.  Patient reports pain improves with rest. ? ?Low suspicion for cauda equina syndrome as patient denies any numbness, weakness, saddle anesthesia, bowel/bladder dysfunction.  Patient noted to have +5 strength to bilateral upper and lower extremities.  Patient able to stand and ambulate without difficulty.  Sensation to light touch grossly intact to bilateral upper and lower extremities.  Patient has tenderness to right lumbar paraspinal muscles.  We will place patient on lidocaine patch.  Will obtain x-ray imaging of lumbar spine due to patient's history of malignancy. ? ?I personally viewed and interpreted patient's lab results.  I  agree with radiology interpretation of degenerative disc disease at L4-L5 and L5-S1 with grade 1-2 anterolisthesis of L4 on L5. ? ?On repeat examination patient continues to have no focal neurological deficits.  Patient reports improvement in pain after receiving the lidocaine patch.  Suspect that patient's pain is musculoskeletal in nature due to mechanism of injury and physical exam.  We will have patient follow-up with her primary care provider.  Patient given strict return precautions.  Advised to return to [facility] or [facility] as needed. ? ?Based on patient's chief complaint, I considered admission might be necessary, however after reassuring ED workup feel patient is reasonable for discharge.  Discussed results, findings, treatment and follow up. Patient advised of return precautions. Patient verbalized understanding and agreed with plan. ? ?Portions of this note were generated with [employer]. Dictation errors may occur despite best attempts at proofreading. ? ? ? ? ? ? ? ? ?Final Clinical Impression(s) / ED Diagnoses ?Final  diagnoses:  ?Acute right-sided low back pain without sciatica  ? ? ?Rx / DC Orders ?ED Discharge Orders   ? ?      Ordered  ?  Lidocaine (HM LIDOCAINE PATCH) 4 % PTCH  Every 12 hours PRN       ? 03/07/22 1542  ?

## 2022-03-07 NOTE — Discharge Instructions (Addendum)
You came to the emergency department today to be evaluated for your back pain.  Your physical exam was reassuring.  The x-ray of your lumbar back did not show any broken bones or dislocations.  It did show some degenerative changes.  Please follow-up with your primary care doctor for repeat evaluation. ? ?I have given you prescription for lidocaine patches.  Please use these as prescribed. ? ?Please take Tylenol (acetaminophen) to relieve your pain.  You make take tylenol, up to 1,000 mg (two extra strength pills) every 8 hours as needed.  Do not take more than 3,000 mg tylenol in a 24 hour period (not more than one dose every 8 hours.  Please check all medication labels as many medications such as pain and cold medications may contain tylenol.  Do not drink alcohol while taking these medications.  ? ?Get help right away if: ?You develop new bowel or bladder control problems. ?You have unusual weakness or numbness in your arms or legs. ?You feel faint. ?

## 2022-03-07 NOTE — ED Triage Notes (Signed)
Pt arrives pov, slow gait with c/o lower back pain x 1 week. Denies injury or dysuria ?

## 2022-05-17 ENCOUNTER — Emergency Department (HOSPITAL_BASED_OUTPATIENT_CLINIC_OR_DEPARTMENT_OTHER): Payer: Medicare Other

## 2022-05-17 ENCOUNTER — Encounter (HOSPITAL_BASED_OUTPATIENT_CLINIC_OR_DEPARTMENT_OTHER): Payer: Self-pay | Admitting: Urology

## 2022-05-17 ENCOUNTER — Emergency Department (HOSPITAL_BASED_OUTPATIENT_CLINIC_OR_DEPARTMENT_OTHER)
Admission: EM | Admit: 2022-05-17 | Discharge: 2022-05-17 | Disposition: A | Discharge status: Home / Self Care | Payer: Medicare Other | Attending: Emergency Medicine | Admitting: Emergency Medicine

## 2022-05-17 DIAGNOSIS — R259 Unspecified abnormal involuntary movements: Secondary | ICD-10-CM | POA: Diagnosis not present

## 2022-05-17 DIAGNOSIS — Z853 Personal history of malignant neoplasm of breast: Secondary | ICD-10-CM | POA: Diagnosis not present

## 2022-05-17 DIAGNOSIS — Z7982 Long term (current) use of aspirin: Secondary | ICD-10-CM | POA: Insufficient documentation

## 2022-05-17 DIAGNOSIS — R030 Elevated blood-pressure reading, without diagnosis of hypertension: Secondary | ICD-10-CM

## 2022-05-17 DIAGNOSIS — R413 Other amnesia: Secondary | ICD-10-CM

## 2022-05-17 DIAGNOSIS — R4182 Altered mental status, unspecified: Secondary | ICD-10-CM | POA: Diagnosis present

## 2022-05-17 DIAGNOSIS — I10 Essential (primary) hypertension: Secondary | ICD-10-CM | POA: Diagnosis not present

## 2022-05-17 DIAGNOSIS — E119 Type 2 diabetes mellitus without complications: Secondary | ICD-10-CM | POA: Insufficient documentation

## 2022-05-17 LAB — COMPREHENSIVE METABOLIC PANEL
ALT: 27 U/L (ref 0–44)
AST: 36 U/L (ref 15–41)
Albumin: 4 g/dL (ref 3.5–5.0)
Alkaline Phosphatase: 58 U/L (ref 38–126)
Anion gap: 11 (ref 5–15)
BUN: 27 mg/dL — ABNORMAL HIGH (ref 8–23)
CO2: 22 mmol/L (ref 22–32)
Calcium: 9.1 mg/dL (ref 8.9–10.3)
Chloride: 108 mmol/L (ref 98–111)
Creatinine, Ser: 1.38 mg/dL — ABNORMAL HIGH (ref 0.44–1.00)
GFR, Estimated: 42 mL/min — ABNORMAL LOW (ref >60)
Glucose, Bld: 276 mg/dL — ABNORMAL HIGH (ref 70–99)
Potassium: 3 mmol/L — ABNORMAL LOW (ref 3.5–5.1)
Sodium: 141 mmol/L (ref 135–145)
Total Bilirubin: 1.2 mg/dL (ref 0.3–1.2)
Total Protein: 7.9 g/dL (ref 6.5–8.1)

## 2022-05-17 LAB — CBC WITH DIFFERENTIAL/PLATELET
Abs Immature Granulocytes: 0.03 10*3/uL (ref 0.00–0.07)
Basophils Absolute: 0.1 10*3/uL (ref 0.0–0.1)
Basophils Relative: 1 %
Eosinophils Absolute: 0.1 10*3/uL (ref 0.0–0.5)
Eosinophils Relative: 0 %
HCT: 39.9 % (ref 36.0–46.0)
Hemoglobin: 13.3 g/dL (ref 12.0–15.0)
Immature Granulocytes: 0 %
Lymphocytes Relative: 24 %
Lymphs Abs: 2.9 10*3/uL (ref 0.7–4.0)
MCH: 28.4 pg (ref 26.0–34.0)
MCHC: 33.3 g/dL (ref 30.0–36.0)
MCV: 85.3 fL (ref 80.0–100.0)
Monocytes Absolute: 0.7 10*3/uL (ref 0.1–1.0)
Monocytes Relative: 6 %
Neutro Abs: 8.1 10*3/uL — ABNORMAL HIGH (ref 1.7–7.7)
Neutrophils Relative %: 69 %
Platelets: 304 10*3/uL (ref 150–400)
RBC: 4.68 MIL/uL (ref 3.87–5.11)
RDW: 14.8 % (ref 11.5–15.5)
WBC: 11.8 10*3/uL — ABNORMAL HIGH (ref 4.0–10.5)
nRBC: 0 % (ref 0.0–0.2)

## 2022-05-17 LAB — RAPID URINE DRUG SCREEN, HOSP PERFORMED
Amphetamines: NOT DETECTED
Barbiturates: NOT DETECTED
Benzodiazepines: NOT DETECTED
Cocaine: NOT DETECTED
Opiates: NOT DETECTED
Tetrahydrocannabinol: NOT DETECTED

## 2022-05-17 LAB — URINALYSIS, ROUTINE W REFLEX MICROSCOPIC
Bilirubin Urine: NEGATIVE
Glucose, UA: NEGATIVE mg/dL
Hgb urine dipstick: NEGATIVE
Ketones, ur: NEGATIVE mg/dL
Leukocytes,Ua: NEGATIVE
Nitrite: NEGATIVE
Protein, ur: 100 mg/dL — AB
Specific Gravity, Urine: >=1.03 (ref 1.005–1.030)
pH: 5.5 (ref 5.0–8.0)

## 2022-05-17 LAB — I-STAT VENOUS BLOOD GAS, ED
Acid-Base Excess: 1 mmol/L (ref 0.0–2.0)
Bicarbonate: 22.8 mmol/L (ref 20.0–28.0)
Calcium, Ion: 1.13 mmol/L — ABNORMAL LOW (ref 1.15–1.40)
HCT: 39 % (ref 36.0–46.0)
Hemoglobin: 13.3 g/dL (ref 12.0–15.0)
O2 Saturation: 88 %
Potassium: 3.4 mmol/L — ABNORMAL LOW (ref 3.5–5.1)
Sodium: 144 mmol/L (ref 135–145)
TCO2: 24 mmol/L (ref 22–32)
pCO2, Ven: 28.4 mmHg — ABNORMAL LOW (ref 44–60)
pH, Ven: 7.514 — ABNORMAL HIGH (ref 7.25–7.43)
pO2, Ven: 48 mmHg — ABNORMAL HIGH (ref 32–45)

## 2022-05-17 LAB — URINALYSIS, MICROSCOPIC (REFLEX)

## 2022-05-17 LAB — TROPONIN I (HIGH SENSITIVITY)
Troponin I (High Sensitivity): 7 ng/L (ref <18)
Troponin I (High Sensitivity): 7 ng/L (ref <18)

## 2022-05-17 IMAGING — DX DG CHEST 1V PORT
1 series · 1 of 1 positions shown · non-contrast
Comparison: Chest radiograph dated [DATE]

CLINICAL DATA: Altered mental status.

EXAM:
PORTABLE CHEST 1 VIEW

[chest ap]
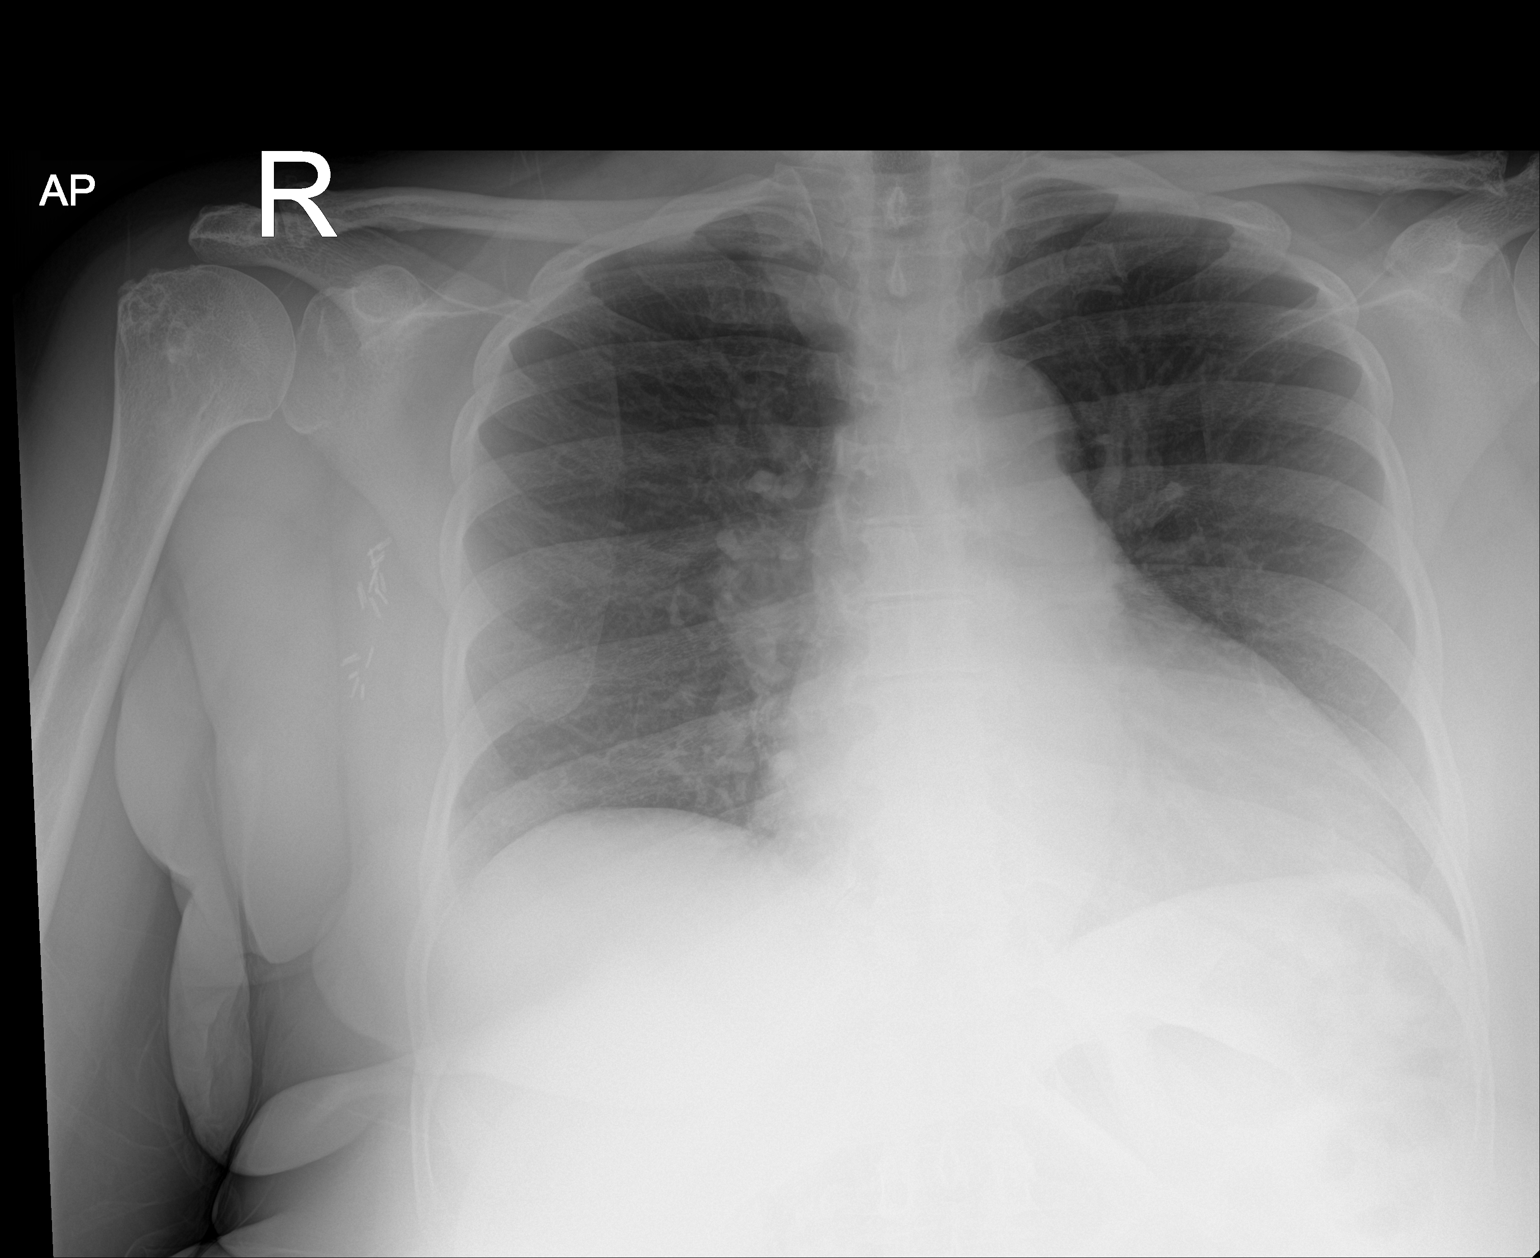

[1 of 1 positions shown; findings below may reference images not displayed]

FINDINGS: The heart is mildly enlarged, unchanged. Both lungs are clear. The
visualized skeletal structures are unremarkable. Surgical clips in
the right axilla.
IMPRESSION: No active disease.

## 2022-05-17 IMAGING — CT CT HEAD W/O CM
3 series · 16 of 47 positions shown, 19 images · non-contrast
Comparison: CT examination dated [DATE]

CLINICAL DATA: Short-term memory loss, mental status change.



[Series 2: head wo · axial · 0.40mm/px · z∈[-188,-58]mm · 10 of 32 slices shown, 13 images]
[im 3/32  brain]
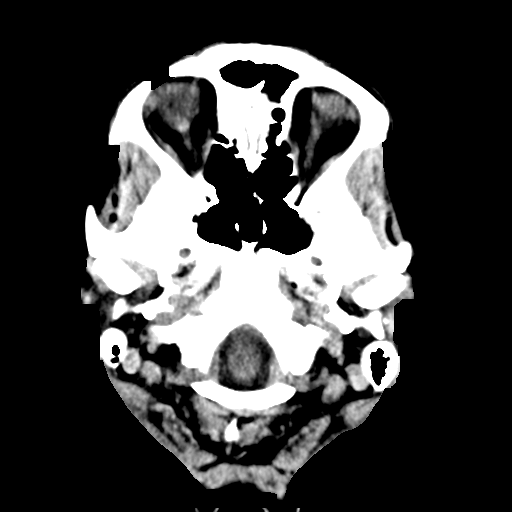
[im 3/32  bone]
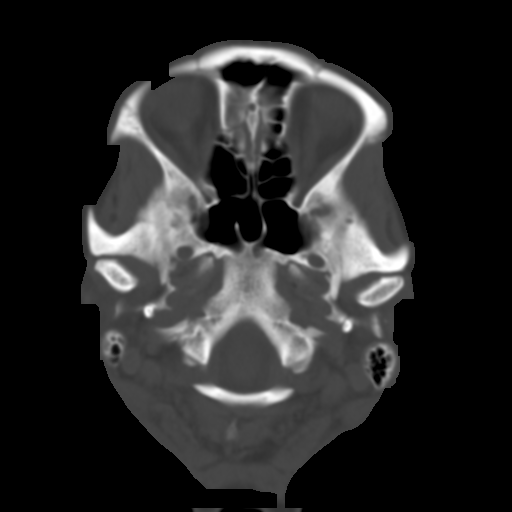
[im 6/32  brain]
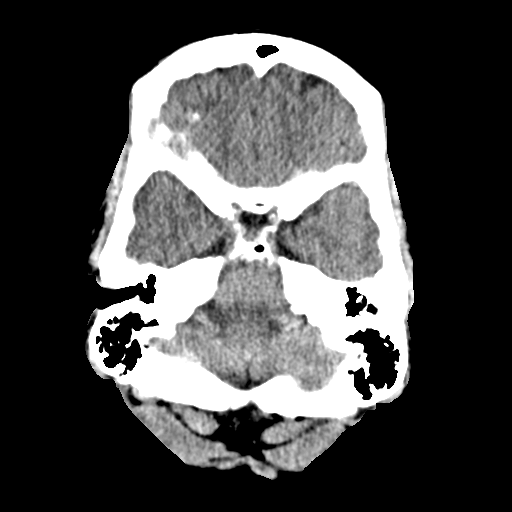
[im 9/32  brain]
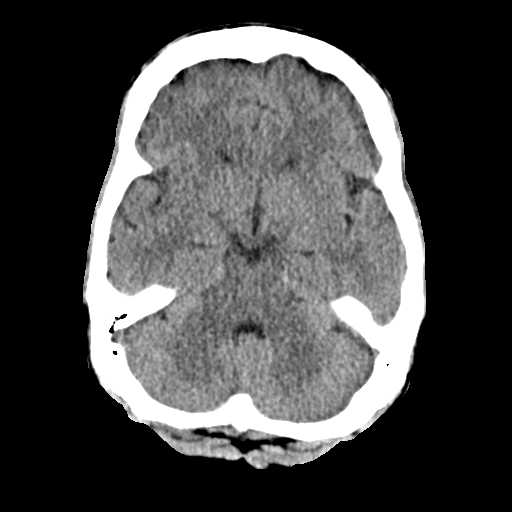
[im 11/32  brain]
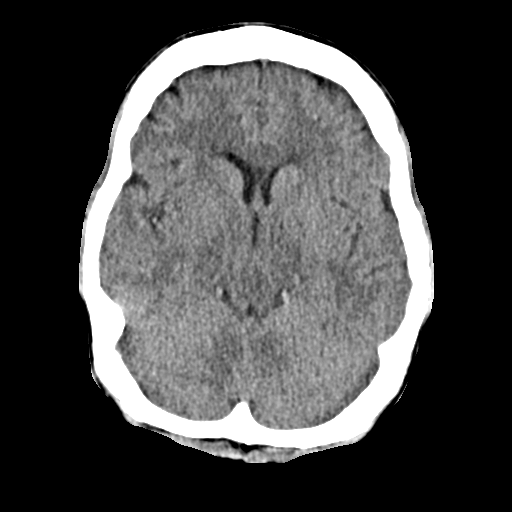
[im 14/32  brain]
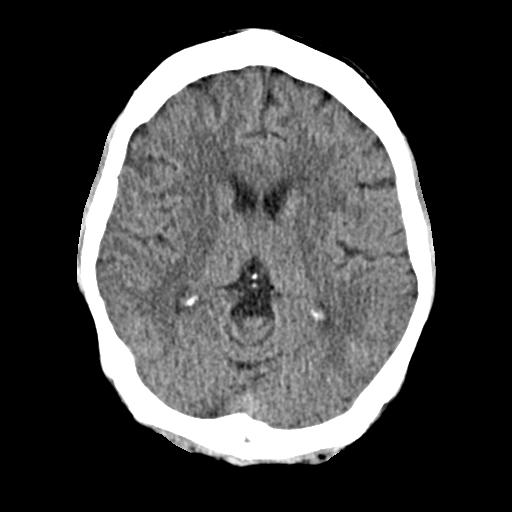
[im 14/32  bone]
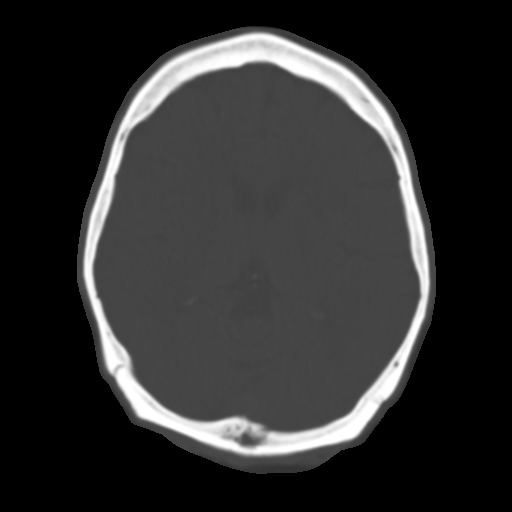
[im 18/32  brain]
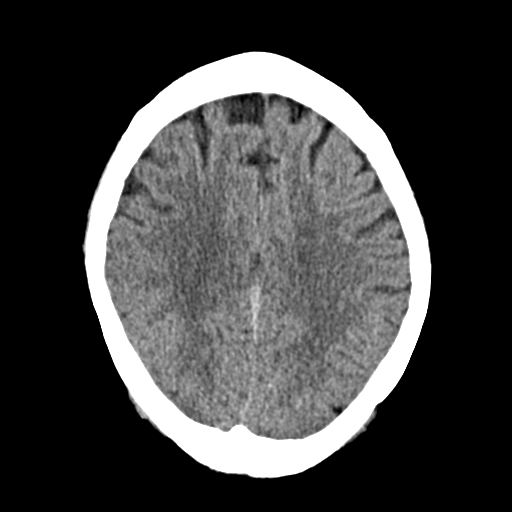
[im 21/32  brain]
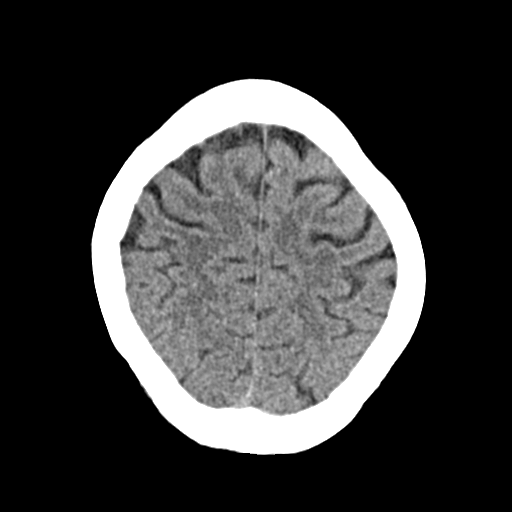
[im 24/32  brain]
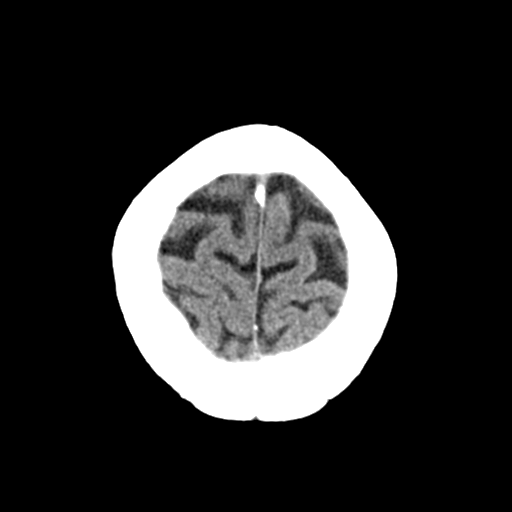
[im 26/32  brain]
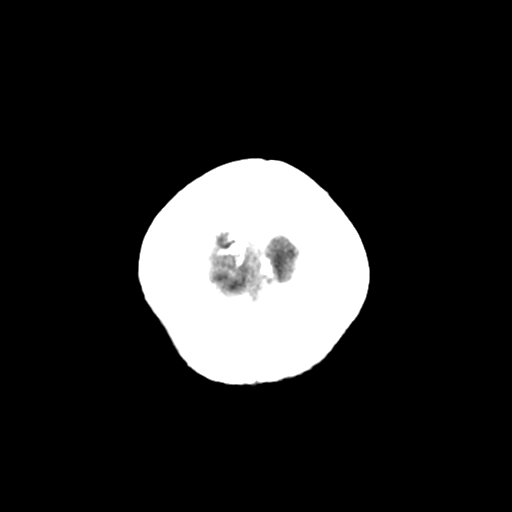
[im 26/32  bone]
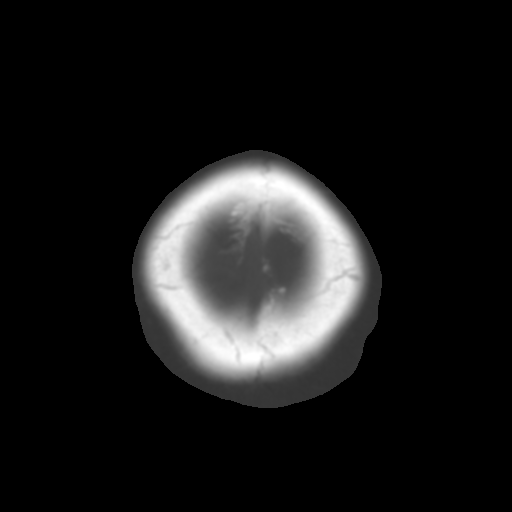
[im 29/32  brain]
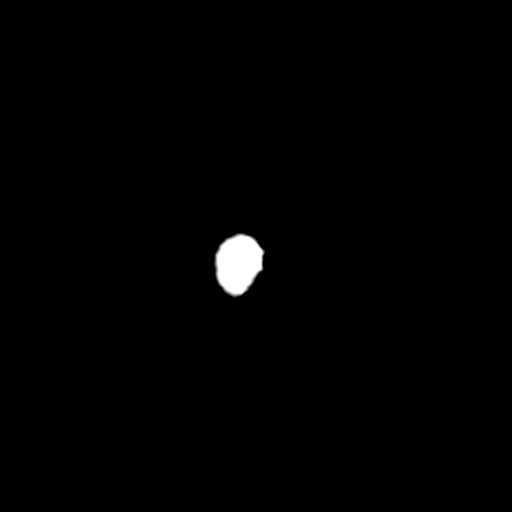

[Series 4: coronal soft · coronal · 0.32mm/px · 3 of 65 slices shown]
[im 22/65  brain]
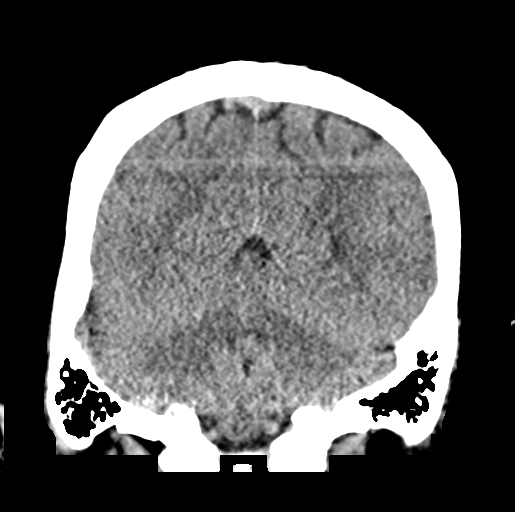
[im 29/65  brain]
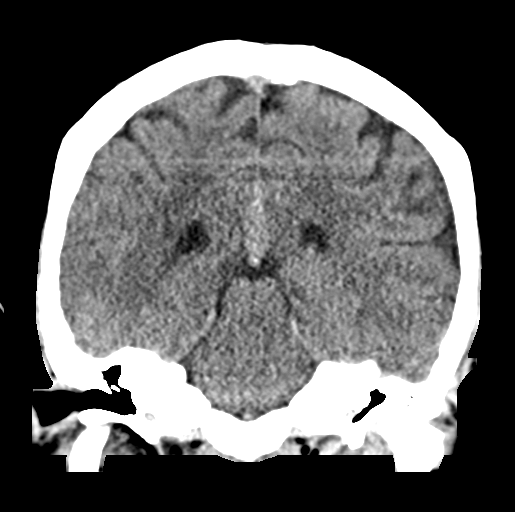
[im 36/65  brain]
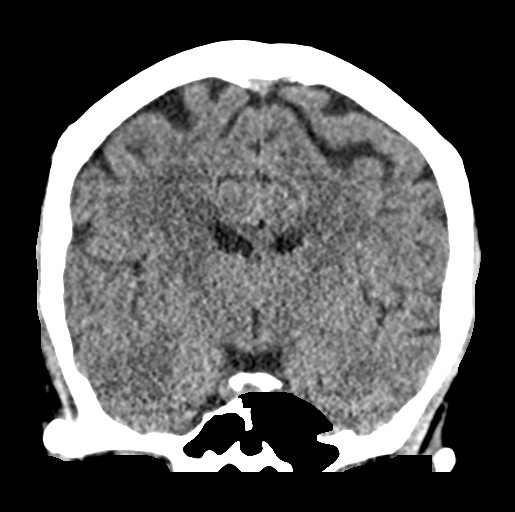

[Series 5: sag soft · sagittal · 0.31mm/px · 3 of 52 slices shown]
[im 18/52  brain]
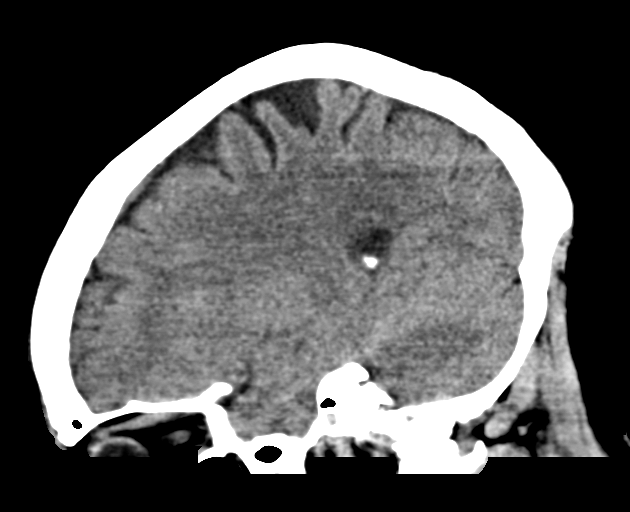
[im 26/52  brain]
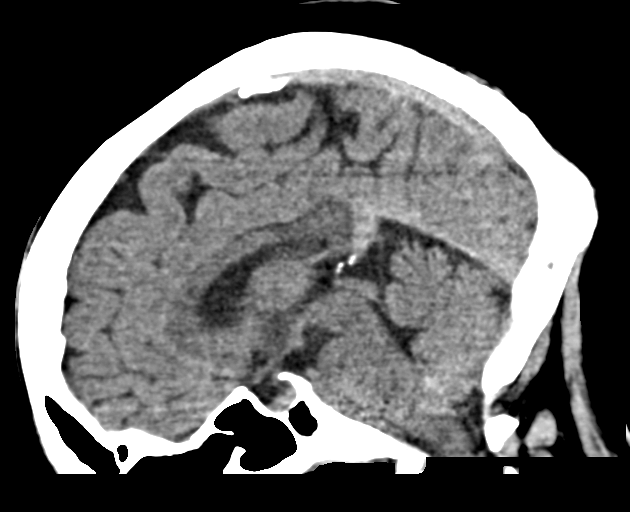
[im 35/52  brain]
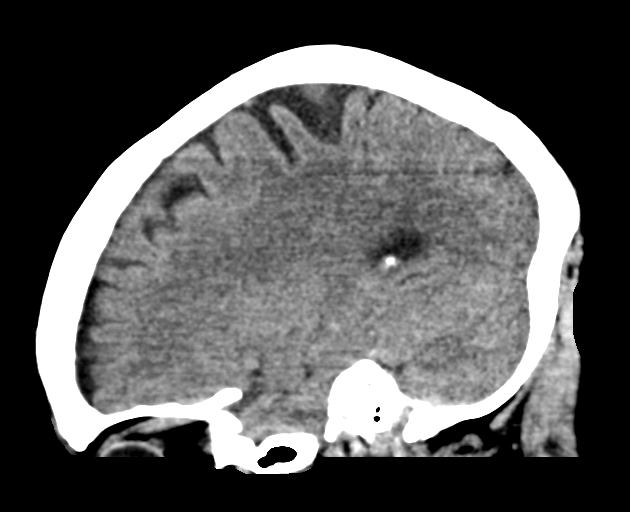

[16 of 47 positions shown; findings below may reference images not displayed]

FINDINGS: Brain: No evidence of acute infarction, hemorrhage, hydrocephalus,
extra-axial collection or mass lesion/mass effect. Scattered area of
low attenuation in the periventricular white matter presumed chronic
microvascular ischemic changes.

Vascular: No hyperdense vessel or unexpected calcification.

Skull: Normal. Negative for fracture or focal lesion.

Sinuses/Orbits: No acute finding.

Other: None.
IMPRESSION: No acute intracranial abnormality.

## 2022-05-17 MED ORDER — INSULIN ASPART 100 UNIT/ML IJ SOLN
6.0000 [IU] | Freq: Once | INTRAMUSCULAR | Status: AC
Start: 2022-05-17 — End: 2022-05-17
  Administered 2022-05-17: 6 [IU] via SUBCUTANEOUS

## 2022-05-17 MED ORDER — SODIUM CHLORIDE 0.9 % IV BOLUS
500.0000 mL | Freq: Once | INTRAVENOUS | Status: AC
  Administered 2022-05-17: 500 mL via INTRAVENOUS

## 2022-05-17 MED ORDER — POTASSIUM CHLORIDE 20 MEQ PO PACK
40.0000 meq | PACK | Freq: Two times a day (BID) | ORAL | Status: DC
Start: 2022-05-17 — End: 2022-05-17
  Administered 2022-05-17: 40 meq via ORAL
  Filled 2022-05-17: qty 2

## 2022-05-17 NOTE — ED Triage Notes (Signed)
Pt states "I am fine" states family says she is becoming more forgetful over past 2 years  Not remembering making calls to family  Feels she is getting worse   Able to tell bday, day of week, year, and president  A&O x 4

## 2022-05-17 NOTE — ED Notes (Addendum)
Pt brought by family . Ambulatory to room. Family reports pts mother came to live with pt yesterday. She came from SNF and felt she needed to be seen for her episodes of short term memory loss such as forgetting to pay bills. Seemed dazed yesterday and today per family. Pt states she is fine. Alert and oriented x 4

## 2022-05-17 NOTE — Discharge Instructions (Addendum)
Ambulatory referral to neurology made. Please call to schedule an appointment. Let the know a referral from the emergency department was placed.   [facility] [address], [place], [place] [address] [phone]  Follow-up with primary care physician for elevated blood pressure.

## 2022-05-17 NOTE — ED Provider Notes (Incomplete)
[facility] EMERGENCY DEPARTMENT Provider Note   CSN: [id number] Arrival date & time: 05/17/22  1138     History {Add pertinent medical, surgical, social history, OB history to HPI:1} Chief Complaint  Patient presents with   Memory Loss    Monica Bauer is a 66 y.o. female.  Patient is a 66 year old female presenting from home for altered mental status.  Patient's friend in the room states she apically has poor memory over the past 2 years including forgetting to pay her bills including her hospital.  States she has been having standing episodes of forgetfulness.  Denies any fevers, chills, coughing, diarrhea, vomiting, or dysuria.  Denies chest pain or shortness of breath.  Denies history of frequent urinary tract infections.  Denies recent falls or head trauma.  Nuys sensation deficits, motor deficits, slurred speech, difficulty speaking, or facial droop.  The history is provided by the patient. No language interpreter was used.       Home Medications Prior to Admission medications   Medication Sig Start Date End Date Taking? Authorizing Provider  amLODipine (NORVASC) 10 MG tablet Take 10 mg by mouth daily. 04/07/15 01/15/20  [provider]  Cholecalciferol (VITAMIN D3 PO) Take 1,000 Units by mouth daily.     [provider]  glipiZIDE (GLUCOTROL XL) 5 MG 24 hr tablet Take 5 mg by mouth daily with breakfast.    [provider]  levETIRAcetam (KEPPRA) 250 MG tablet Take 1 tablet (250 mg total) by mouth 2 (two) times daily for 1 day. 10/28/19 10/29/19  Harold Hedge, MD  levETIRAcetam (KEPPRA) 500 MG tablet Take 1 tablet (500 mg total) by mouth 2 (two) times daily. 10/30/19 11/29/19  Harold Hedge, MD  Lidocaine (HM LIDOCAINE PATCH) 4 % PTCH Apply 1 patch topically every 12 (twelve) hours as needed. 03/07/22   Loni Beckwith, PA-C  Multiple Vitamin (MULTIVITAMIN WITH MINERALS) TABS tablet Take 1 tablet by mouth daily.    [provider]      Allergies    Other    Review of Systems   Review of Systems  Constitutional:  Negative for chills and fever.  HENT:  Negative for ear pain and sore throat.   Eyes:  Negative for pain and visual disturbance.  Respiratory:  Negative for cough and shortness of breath.   Cardiovascular:  Negative for chest pain and palpitations.  Gastrointestinal:  Negative for abdominal pain and vomiting.  Genitourinary:  Negative for dysuria and hematuria.  Musculoskeletal:  Negative for arthralgias and back pain.  Skin:  Negative for color change and rash.  Neurological:  Negative for seizures and syncope.  Psychiatric/Behavioral:  Positive for confusion.   All other systems reviewed and are negative.   Physical Exam Updated Vital Signs BP (!) 170/96   Pulse 91   Temp 98.9 F (37.2 C) (Oral)   Resp 17   Ht '5\' 2"'$  (1.575 m)   Wt 90.7 kg   SpO2 97%   BMI 36.57 kg/m  Physical Exam Vitals and nursing note reviewed.  Constitutional:      General: She is not in acute distress.    Appearance: She is well-developed.  HENT:     Head: Normocephalic and atraumatic.  Eyes:     General: Lids are normal. Vision grossly intact.     Conjunctiva/sclera: Conjunctivae normal.     Pupils: Pupils are equal, round, and reactive to light.  Cardiovascular:     Rate and Rhythm: Normal rate and  regular rhythm.     Heart sounds: No murmur heard. Pulmonary:     Effort: Pulmonary effort is normal. No respiratory distress.     Breath sounds: Normal breath sounds.  Abdominal:     Palpations: Abdomen is soft.     Tenderness: There is no abdominal tenderness.  Musculoskeletal:        General: No swelling.     Cervical back: Neck supple.  Skin:    General: Skin is warm and dry.     Capillary Refill: Capillary refill takes less than 2 seconds.     Findings: No rash or wound.  Neurological:     General: No focal deficit present.     Mental Status: She is alert and oriented to person, place, and time.      GCS: GCS eye subscore is 4. GCS verbal subscore is 5. GCS motor subscore is 6.     Cranial Nerves: Cranial nerves 2-12 are intact.     Sensory: Sensation is intact.     Motor: Motor function is intact.     Coordination: Coordination is intact.     Gait: Gait is intact.  Psychiatric:        Mood and Affect: Mood normal.     ED Results / Procedures / Treatments   Labs (all labs ordered are listed, but only abnormal results are displayed) Labs Reviewed - No data to display  EKG None  Radiology No results found.  Procedures Procedures  {Document cardiac monitor, telemetry assessment procedure when appropriate:1}  Medications Ordered in ED Medications - No data to display  ED Course/ Medical Decision Making/ A&P                           Medical Decision Making Amount and/or Complexity of Data Reviewed Labs: ordered. Radiology: ordered.  Risk Prescription drug management.   69:62 PM 66 year old female presenting from home for altered mental status.  Patient is alert and oriented x3, no acute distress, afebrile, stable to signs.  Differential diagnosis includes but is not limited to hypoxia, hypercarbia, sepsis, urinary tract infection, hypothyroidism, intracranial abnormalities, the metabolic disturbances, or dementia.  Dementia versus delirium  -Physical exam demonstrates no neurovascular deficits.  No signs or symptoms of stroke.  No abnormal gait.    -Exam demonstrates no signs of sepsis.  No physical exam findings suggesting infection.  Urine analysis demonstrates no urinary tract infection  -No bleeding.  No anemia.  -No hypoglycemia.  Patient hyperglycemic [place] secondary to diabetes.  Glucose below 300.  No acidosis.  No anion gap.  Subcu insulin.   -Stable ECG, troponin, and electrolytes.  -No hypercarbia.  -During evaluation emergency department patient has episode of heavy breathing, hyperventilating, and begins making strange noises.  When I called  patient's name she immediately stopped and looked at me.  This episode continue to occur frequently throughout her stay in the emergency department.  Each time patient is awake and alert and stops when spoken to.  pH demonstrates respiratory acidosis with low CO2 levels likely secondary to episodes of hyperventilating.  -CT head demonstrates no acute process.  No intracranial etiologies visualized at this time.  Patient in no distress and symptoms seem to be chronic in nature.  Patient recommended for close follow-up with neurology.  Detailed discussions were had with the patient regarding current findings, and need for close f/u with PCP or on call doctor. The patient has been instructed to return immediately  if the symptoms worsen in any way for re-evaluation. Patient verbalized understanding and is in agreement with current care plan. All questions answered prior to discharge.  {Document critical care time when appropriate:1} {Document review of labs and clinical decision tools ie heart score, Chads2Vasc2 etc:1}  {Document your independent review of radiology images, and any outside records:1} {Document your discussion with family members, caretakers, and with consultants:1} {Document social determinants of health affecting pt's care:1} {Document your decision making why or why not admission, treatments were needed:1} Final Clinical Impression(s) / ED Diagnoses Final diagnoses:  None    Rx / DC Orders ED Discharge Orders     None

## 2022-05-17 NOTE — ED Notes (Signed)
Pt provided water, per her request. EDP Pearline Cables aware and agreeable.

## 2022-05-17 NOTE — ED Notes (Signed)
Pt AOx4, without complaints at this time. Call bell within reach, will continue to monitor.

## 2022-05-17 NOTE — ED Notes (Signed)
Pt assisted to bathroom, standby assistance.

## 2022-05-17 NOTE — ED Notes (Signed)
D/c paperwork reviewed with pt and family at bedside. Family reports they are attempting to make neuro appt for pt for tomorrow. No questions or concerns at time of d/c. Pt assisted to wheelchair and wheeled to ED exit.  NAD.

## 2022-05-17 NOTE — ED Notes (Signed)
Pt transported to imaging.

## 2022-05-18 ENCOUNTER — Observation Stay (HOSPITAL_COMMUNITY)
Admission: EM | Admit: 2022-05-18 | Discharge: 2022-05-20 | Disposition: A | Discharge status: Home Health | Payer: Medicare Other | Attending: Internal Medicine | Admitting: Internal Medicine

## 2022-05-18 ENCOUNTER — Emergency Department (HOSPITAL_COMMUNITY): Payer: Medicare Other

## 2022-05-18 ENCOUNTER — Observation Stay (HOSPITAL_BASED_OUTPATIENT_CLINIC_OR_DEPARTMENT_OTHER): Payer: Medicare Other

## 2022-05-18 ENCOUNTER — Encounter (HOSPITAL_COMMUNITY): Payer: Self-pay

## 2022-05-18 ENCOUNTER — Observation Stay (HOSPITAL_COMMUNITY): Payer: Medicare Other

## 2022-05-18 DIAGNOSIS — Z636 Dependent relative needing care at home: Secondary | ICD-10-CM | POA: Insufficient documentation

## 2022-05-18 DIAGNOSIS — R9431 Abnormal electrocardiogram [ECG] [EKG]: Secondary | ICD-10-CM | POA: Insufficient documentation

## 2022-05-18 DIAGNOSIS — Z6836 Body mass index (BMI) 36.0-36.9, adult: Secondary | ICD-10-CM | POA: Insufficient documentation

## 2022-05-18 DIAGNOSIS — I6389 Other cerebral infarction: Secondary | ICD-10-CM

## 2022-05-18 DIAGNOSIS — I639 Cerebral infarction, unspecified: Secondary | ICD-10-CM | POA: Diagnosis not present

## 2022-05-18 DIAGNOSIS — R739 Hyperglycemia, unspecified: Secondary | ICD-10-CM

## 2022-05-18 DIAGNOSIS — Z853 Personal history of malignant neoplasm of breast: Secondary | ICD-10-CM | POA: Diagnosis not present

## 2022-05-18 DIAGNOSIS — R2681 Unsteadiness on feet: Secondary | ICD-10-CM | POA: Insufficient documentation

## 2022-05-18 DIAGNOSIS — R7989 Other specified abnormal findings of blood chemistry: Secondary | ICD-10-CM | POA: Insufficient documentation

## 2022-05-18 DIAGNOSIS — Z634 Disappearance and death of family member: Secondary | ICD-10-CM | POA: Diagnosis not present

## 2022-05-18 DIAGNOSIS — E669 Obesity, unspecified: Secondary | ICD-10-CM | POA: Insufficient documentation

## 2022-05-18 DIAGNOSIS — F410 Panic disorder [episodic paroxysmal anxiety] without agoraphobia: Secondary | ICD-10-CM | POA: Insufficient documentation

## 2022-05-18 DIAGNOSIS — E1165 Type 2 diabetes mellitus with hyperglycemia: Secondary | ICD-10-CM | POA: Insufficient documentation

## 2022-05-18 DIAGNOSIS — E7800 Pure hypercholesterolemia, unspecified: Secondary | ICD-10-CM | POA: Diagnosis not present

## 2022-05-18 DIAGNOSIS — R2689 Other abnormalities of gait and mobility: Secondary | ICD-10-CM | POA: Diagnosis not present

## 2022-05-18 DIAGNOSIS — Z7984 Long term (current) use of oral hypoglycemic drugs: Secondary | ICD-10-CM | POA: Insufficient documentation

## 2022-05-18 DIAGNOSIS — R41841 Cognitive communication deficit: Secondary | ICD-10-CM | POA: Diagnosis not present

## 2022-05-18 DIAGNOSIS — I6381 Other cerebral infarction due to occlusion or stenosis of small artery: Secondary | ICD-10-CM | POA: Insufficient documentation

## 2022-05-18 DIAGNOSIS — I6782 Cerebral ischemia: Secondary | ICD-10-CM | POA: Diagnosis not present

## 2022-05-18 DIAGNOSIS — G454 Transient global amnesia: Secondary | ICD-10-CM | POA: Insufficient documentation

## 2022-05-18 DIAGNOSIS — F0392 Unspecified dementia, unspecified severity, with psychotic disturbance: Secondary | ICD-10-CM | POA: Diagnosis not present

## 2022-05-18 DIAGNOSIS — Z713 Dietary counseling and surveillance: Secondary | ICD-10-CM | POA: Insufficient documentation

## 2022-05-18 DIAGNOSIS — D72829 Elevated white blood cell count, unspecified: Secondary | ICD-10-CM | POA: Diagnosis not present

## 2022-05-18 DIAGNOSIS — G9340 Encephalopathy, unspecified: Secondary | ICD-10-CM | POA: Insufficient documentation

## 2022-05-18 DIAGNOSIS — Z7182 Exercise counseling: Secondary | ICD-10-CM | POA: Insufficient documentation

## 2022-05-18 DIAGNOSIS — R29818 Other symptoms and signs involving the nervous system: Secondary | ICD-10-CM | POA: Diagnosis not present

## 2022-05-18 DIAGNOSIS — E873 Alkalosis: Secondary | ICD-10-CM | POA: Diagnosis not present

## 2022-05-18 DIAGNOSIS — Z79899 Other long term (current) drug therapy: Secondary | ICD-10-CM | POA: Diagnosis not present

## 2022-05-18 DIAGNOSIS — I10 Essential (primary) hypertension: Secondary | ICD-10-CM | POA: Diagnosis not present

## 2022-05-18 DIAGNOSIS — Z87891 Personal history of nicotine dependence: Secondary | ICD-10-CM | POA: Insufficient documentation

## 2022-05-18 DIAGNOSIS — F0390 Unspecified dementia without behavioral disturbance: Secondary | ICD-10-CM | POA: Insufficient documentation

## 2022-05-18 DIAGNOSIS — R4182 Altered mental status, unspecified: Secondary | ICD-10-CM | POA: Diagnosis present

## 2022-05-18 DIAGNOSIS — G4730 Sleep apnea, unspecified: Secondary | ICD-10-CM | POA: Insufficient documentation

## 2022-05-18 DIAGNOSIS — F03918 Unspecified dementia, unspecified severity, with other behavioral disturbance: Secondary | ICD-10-CM | POA: Diagnosis not present

## 2022-05-18 DIAGNOSIS — M6281 Muscle weakness (generalized): Secondary | ICD-10-CM | POA: Insufficient documentation

## 2022-05-18 DIAGNOSIS — Z7982 Long term (current) use of aspirin: Secondary | ICD-10-CM | POA: Insufficient documentation

## 2022-05-18 LAB — DIFFERENTIAL
Abs Immature Granulocytes: 0.04 10*3/uL (ref 0.00–0.07)
Basophils Absolute: 0.1 10*3/uL (ref 0.0–0.1)
Basophils Relative: 1 %
Eosinophils Absolute: 0.2 10*3/uL (ref 0.0–0.5)
Eosinophils Relative: 2 %
Immature Granulocytes: 0 %
Lymphocytes Relative: 26 %
Lymphs Abs: 2.9 10*3/uL (ref 0.7–4.0)
Monocytes Absolute: 0.9 10*3/uL (ref 0.1–1.0)
Monocytes Relative: 8 %
Neutro Abs: 7.1 10*3/uL (ref 1.7–7.7)
Neutrophils Relative %: 63 %

## 2022-05-18 LAB — CBC
HCT: 38.6 % (ref 36.0–46.0)
Hemoglobin: 12.5 g/dL (ref 12.0–15.0)
MCH: 28.7 pg (ref 26.0–34.0)
MCHC: 32.4 g/dL (ref 30.0–36.0)
MCV: 88.7 fL (ref 80.0–100.0)
Platelets: 267 10*3/uL (ref 150–400)
RBC: 4.35 MIL/uL (ref 3.87–5.11)
RDW: 14.7 % (ref 11.5–15.5)
WBC: 11.2 10*3/uL — ABNORMAL HIGH (ref 4.0–10.5)
nRBC: 0 % (ref 0.0–0.2)

## 2022-05-18 LAB — COMPREHENSIVE METABOLIC PANEL
ALT: 30 U/L (ref 0–44)
AST: 42 U/L — ABNORMAL HIGH (ref 15–41)
Albumin: 3.5 g/dL (ref 3.5–5.0)
Alkaline Phosphatase: 54 U/L (ref 38–126)
Anion gap: 13 (ref 5–15)
BUN: 18 mg/dL (ref 8–23)
CO2: 19 mmol/L — ABNORMAL LOW (ref 22–32)
Calcium: 9.1 mg/dL (ref 8.9–10.3)
Chloride: 111 mmol/L (ref 98–111)
Creatinine, Ser: 1.09 mg/dL — ABNORMAL HIGH (ref 0.44–1.00)
GFR, Estimated: 56 mL/min — ABNORMAL LOW (ref >60)
Glucose, Bld: 149 mg/dL — ABNORMAL HIGH (ref 70–99)
Potassium: 3.8 mmol/L (ref 3.5–5.1)
Sodium: 143 mmol/L (ref 135–145)
Total Bilirubin: 1.1 mg/dL (ref 0.3–1.2)
Total Protein: 7 g/dL (ref 6.5–8.1)

## 2022-05-18 LAB — ETHANOL: Alcohol, Ethyl (B): <10 mg/dL (ref <10)

## 2022-05-18 LAB — PROTIME-INR
INR: 1.2 (ref 0.8–1.2)
Prothrombin Time: 14.6 seconds (ref 11.4–15.2)

## 2022-05-18 LAB — APTT: aPTT: 27 seconds (ref 24–36)

## 2022-05-18 LAB — ECHOCARDIOGRAM COMPLETE
Area-P 1/2: 4.21 cm2
Calc EF: 58.6 %
S' Lateral: 2.9 cm
Single Plane A2C EF: 62.4 %
Single Plane A4C EF: 53.9 %

## 2022-05-18 IMAGING — MR MR HEAD W/O CM
6 of 10 series · 27 of 48 positions shown · non-contrast
Comparison: CT head [DATE]

CLINICAL DATA: Acute neuro deficit.

EXAM:
MRI HEAD WITHOUT CONTRAST
TECHNIQUE: Multiplanar, multiecho pulse sequences of the brain and surrounding
structures were obtained without intravenous contrast.

[Series 2: DWI · axial · 3.0mm · 0.94mm/px · z∈[-108,+36]mm · 8 of 100 slices shown (1 of 2)]
[im 1/100]
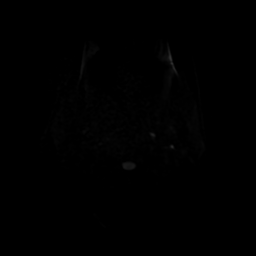
[im 12/100]
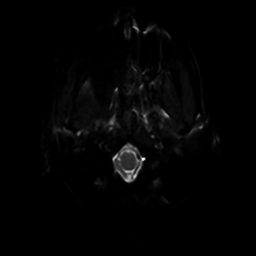
[im 34/100]
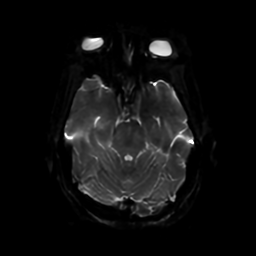
[im 45/100]
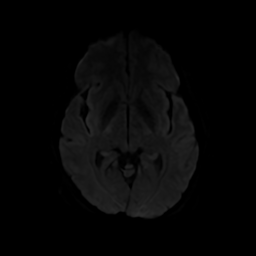
[im 56/100]
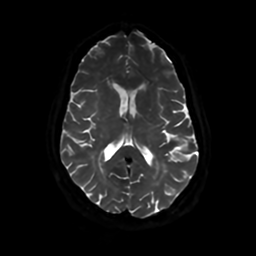
[im 67/100]
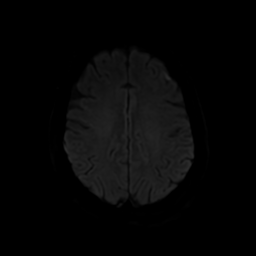
[im 89/100]
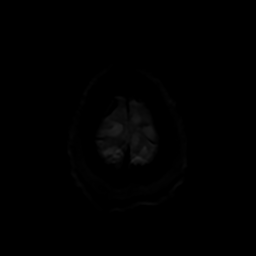
[im 100/100]
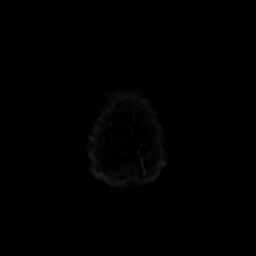

[Series 3: DWI · coronal · 4.0mm · 0.94mm/px · 7 of 74 slices shown (2 of 2)]
[im 1/74]
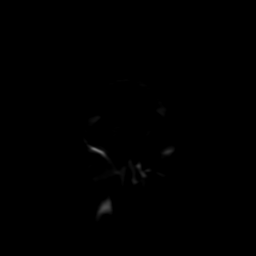
[im 13/74]
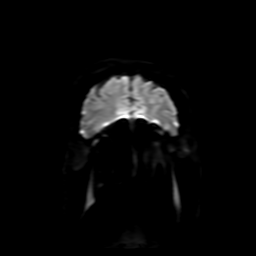
[im 25/74]
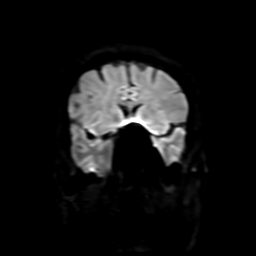
[im 37/74]
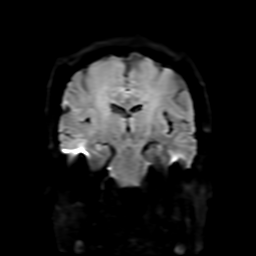
[im 49/74]
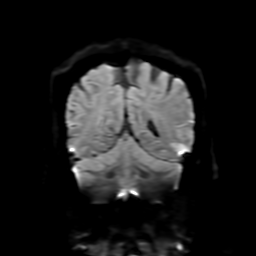
[im 61/74]
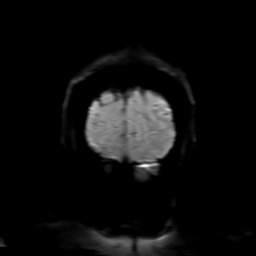
[im 74/74]
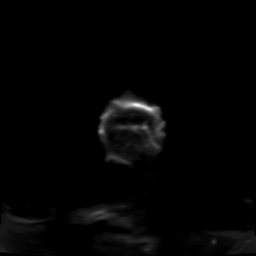

[Series 4: FLAIR · sagittal · 5.0mm · 0.23mm/px · 2 of 23 slices shown (1 of 2)]
[im 1/23]
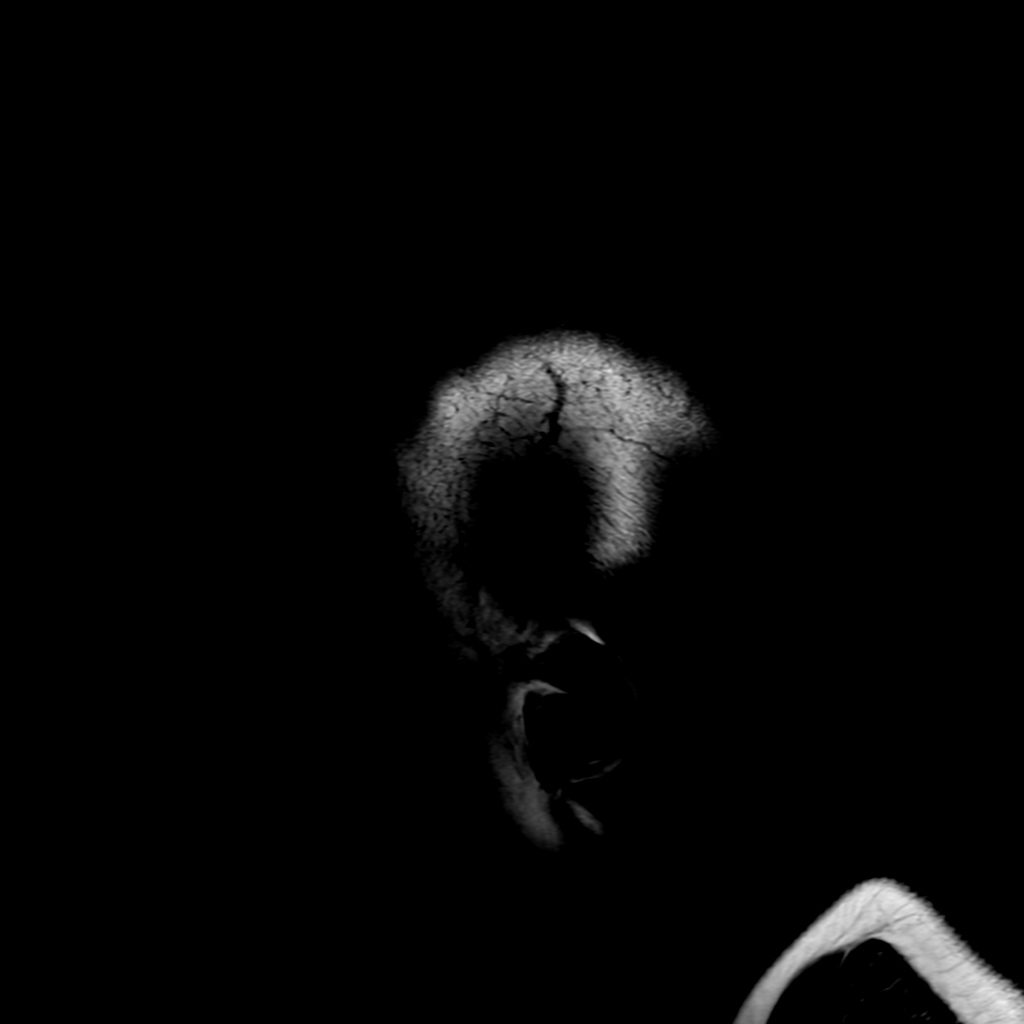
[im 23/23]
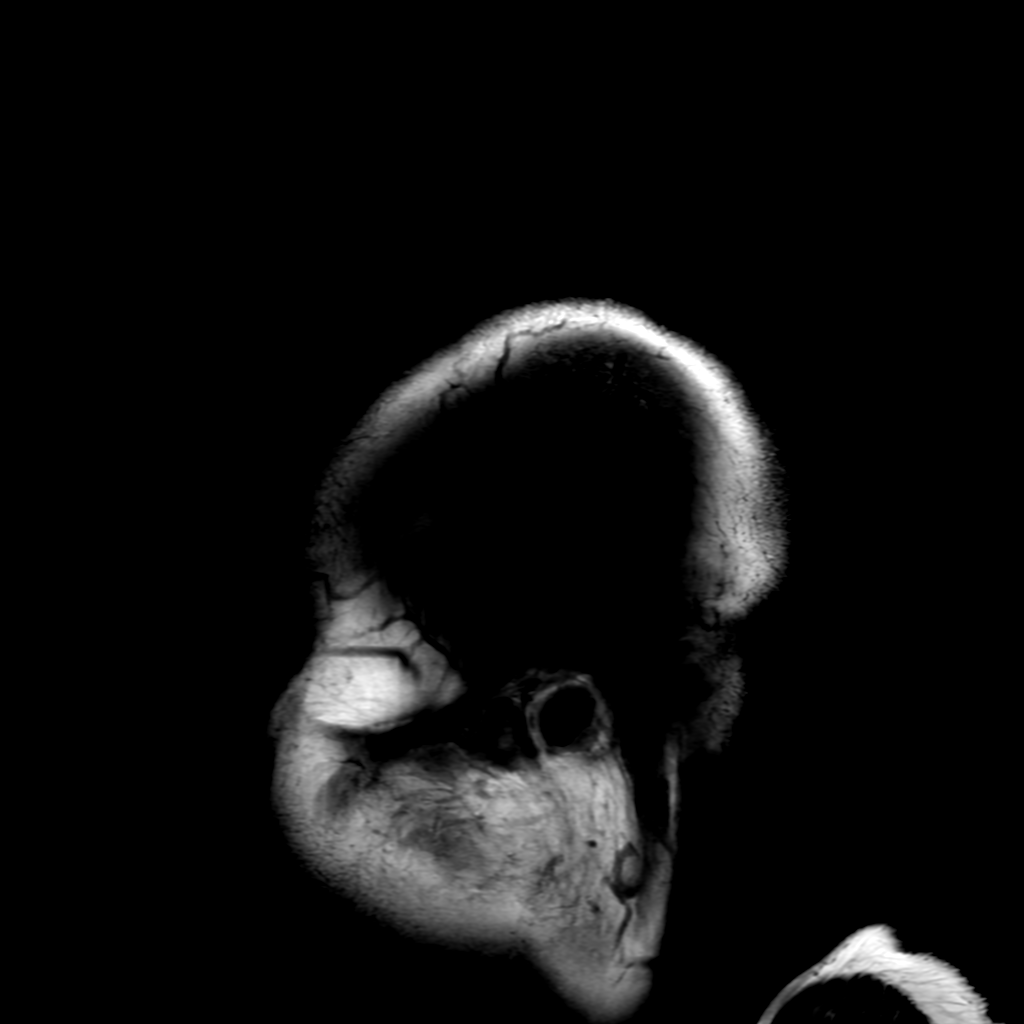

[Series 7: FLAIR · axial · 5.0mm · 0.47mm/px · z∈[-110,+38]mm · 2 of 26 slices shown (2 of 2)]
[im 1/26]
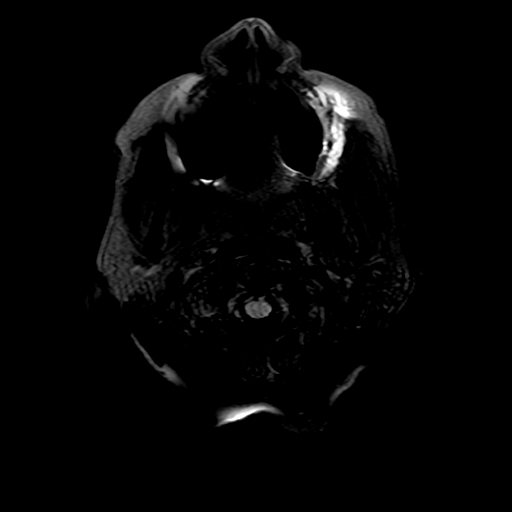
[im 26/26]
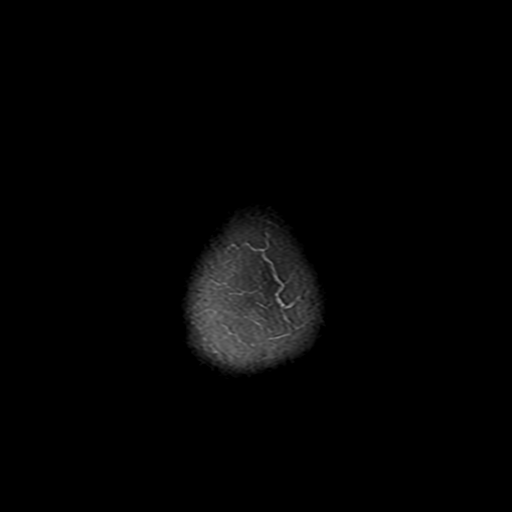

[Series 250: ADC · axial · 3.0mm · 0.94mm/px · z∈[-108,+36]mm · 5 of 49 slices shown (1 of 2)]
[im 1/49]
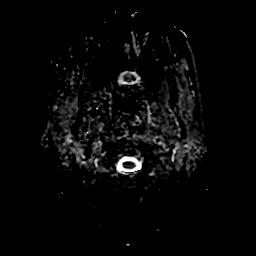
[im 13/49]
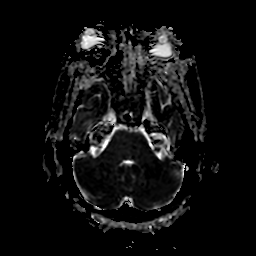
[im 25/49]
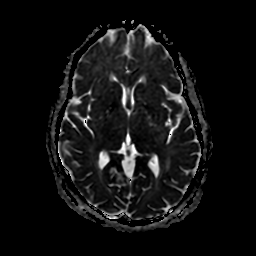
[im 37/49]
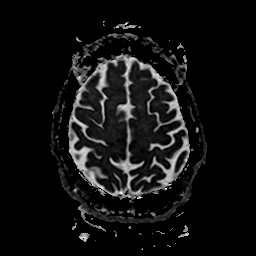
[im 49/49]
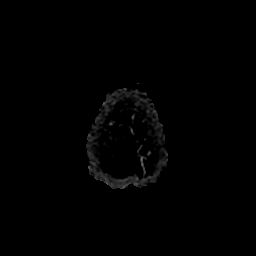

[Series 350: ADC · coronal · 4.0mm · 0.94mm/px · 3 of 37 slices shown (2 of 2)]
[im 1/37]
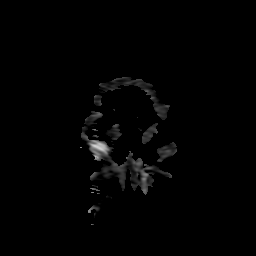
[im 19/37]
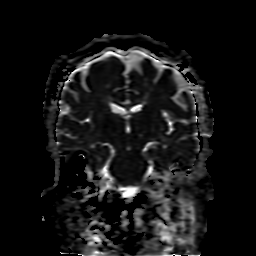
[im 37/37]
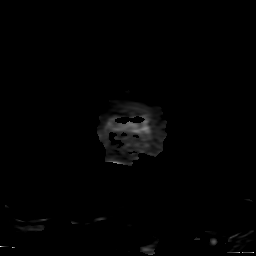

[27 of 48 positions shown; findings below may reference images not displayed]

FINDINGS: Brain: Small focus of restricted diffusion in the right frontal
parietal white matter measuring 5 mm. This is consistent with acute
infarct. No other acute infarct

Moderate white matter changes consistent with chronic microvascular
ischemia. Negative for hemorrhage, mass, or hydrocephalus

Vascular: Normal arterial flow voids

Skull and upper cervical spine: Negative

Sinuses/Orbits: Mucosal edema paranasal sinuses especially the left
maxillary sinus. Mastoid clear. Negative orbit

Other: None
IMPRESSION: 5 mm acute up infarct right frontal parietal white matter.

Moderate chronic microvascular ischemic change.

## 2022-05-18 IMAGING — MR MR MRA NECK W/O CM
2 series · 8 of 9 positions shown · non-contrast
Comparison: MRI head [DATE]

CLINICAL DATA: Stroke

EXAM:
MRA NECK WITHOUT CONTRAST
MRA HEAD WITHOUT CONTRAST
TECHNIQUE: Angiographic images of the Circle of Willis were acquired using MRA
technique without intravenous contrast.

[Series 1003: mip rotate · sagittal · 0.5mm · 0.14mm/px · 5 of 6 slices shown (1 of 2)]
[im 1/6]
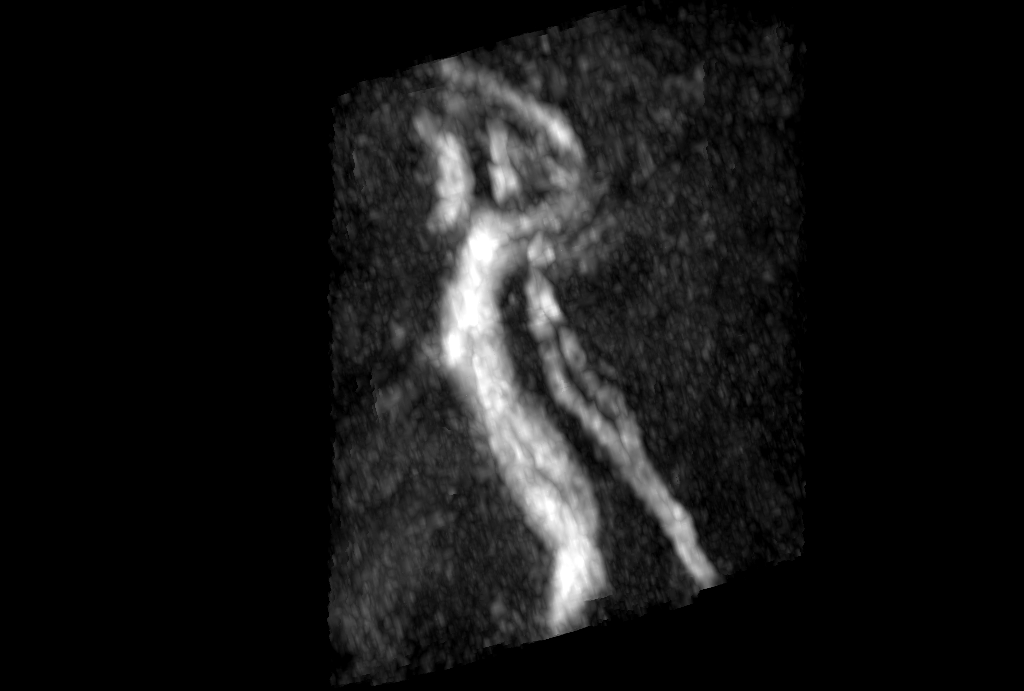
[im 2/6]
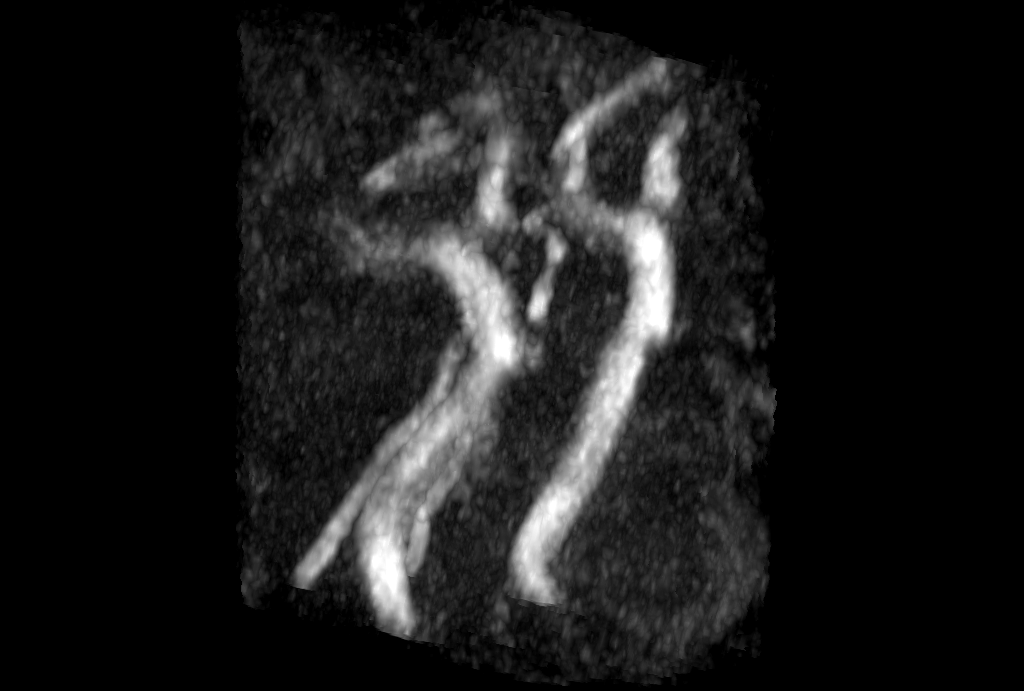
[im 3/6]
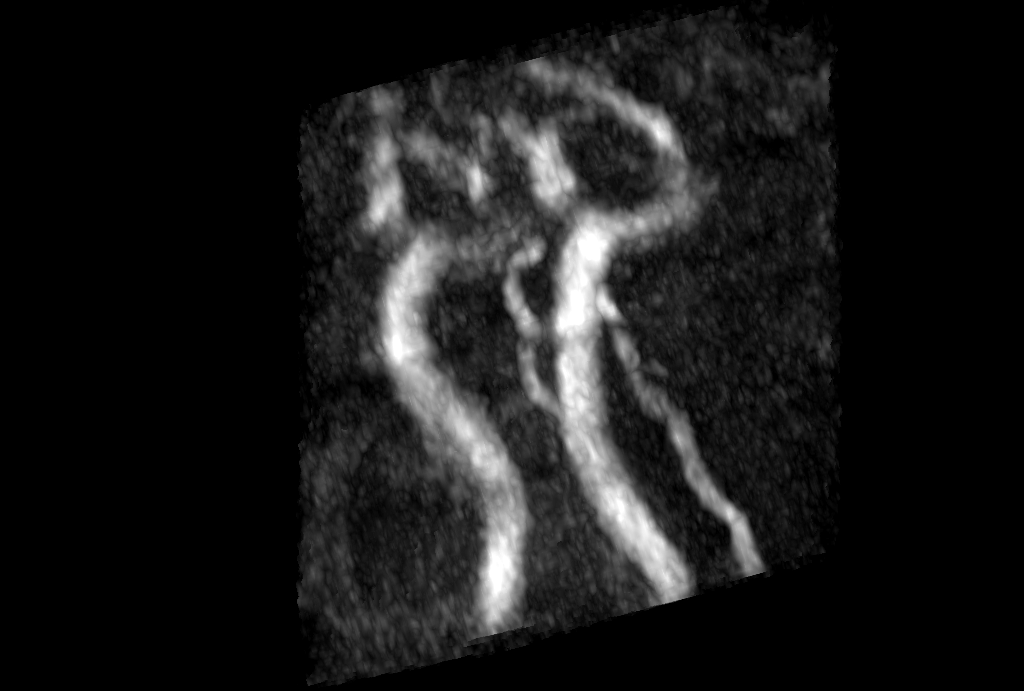
[im 4/6]
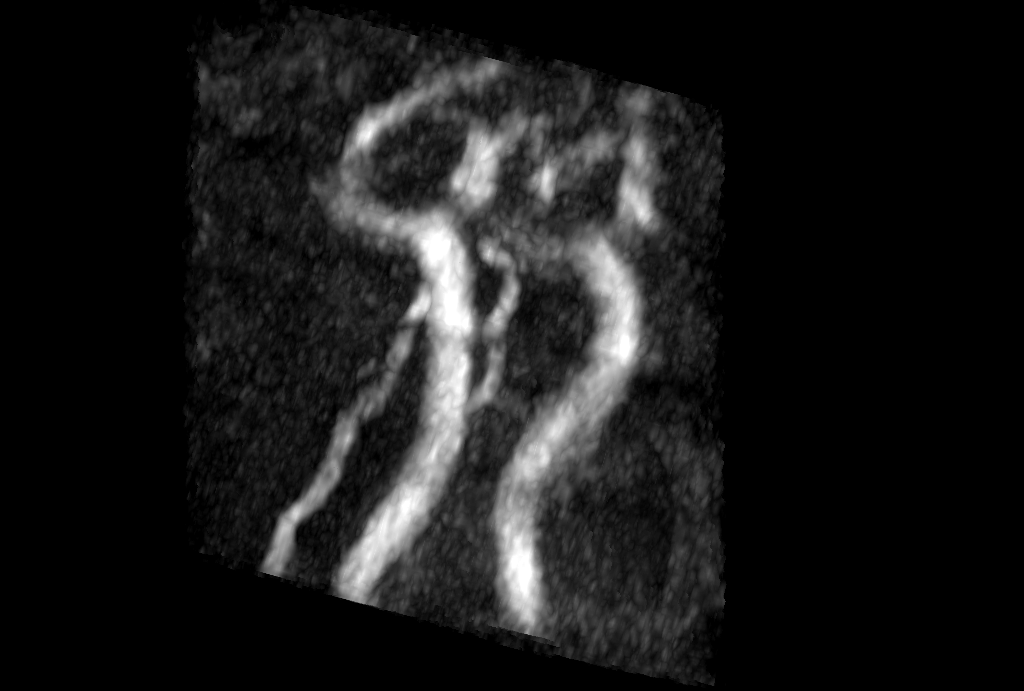
[im 6/6]
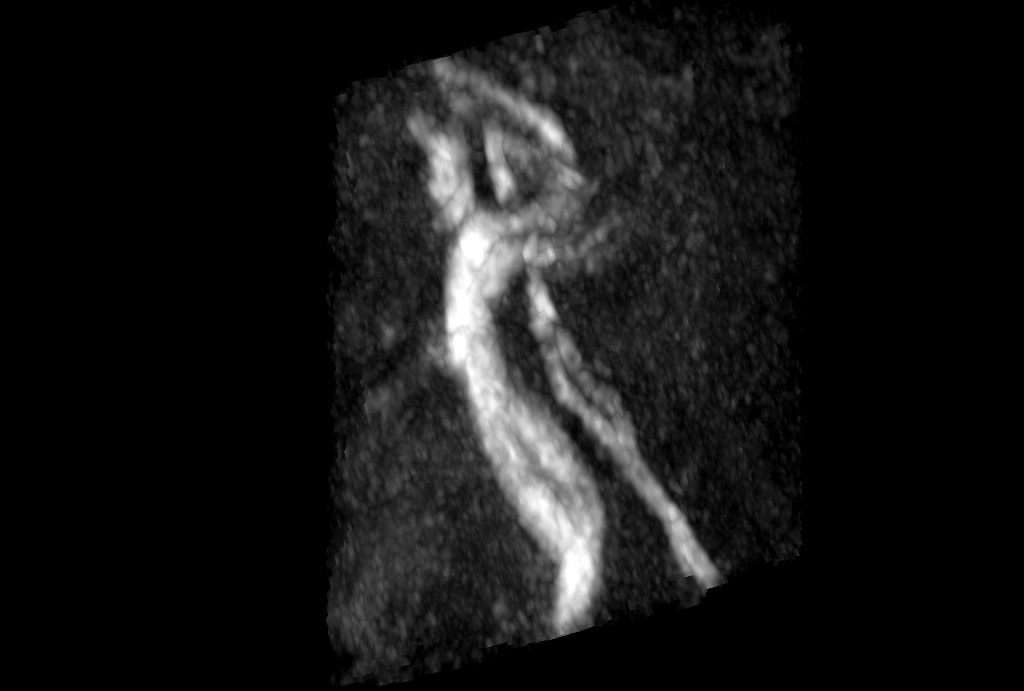

[Series 1003: mip rotate · sagittal · 0.8mm · 0.23mm/px · 3 of 3 slices shown (2 of 2)]
[im 1/3]
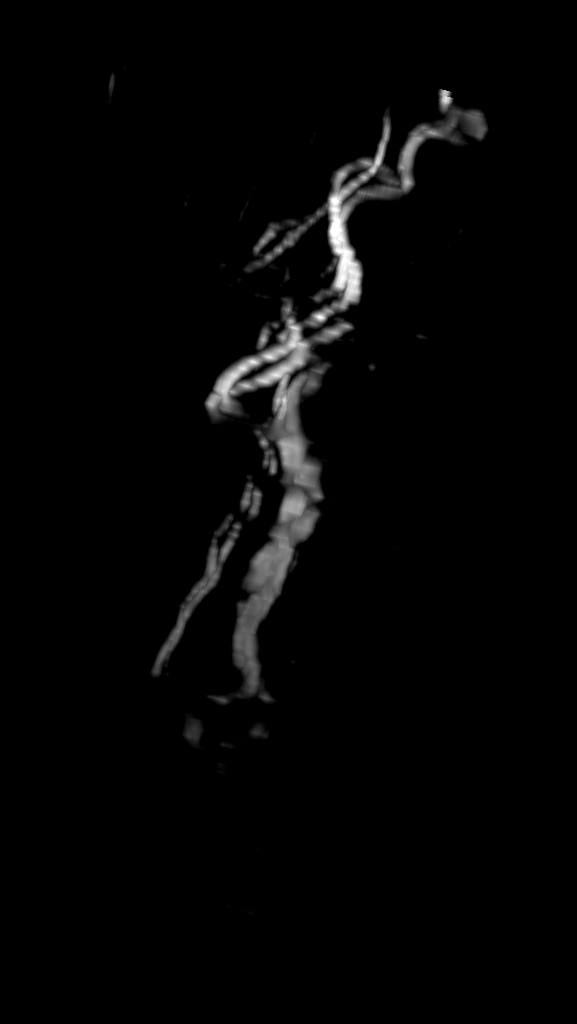
[im 2/3]
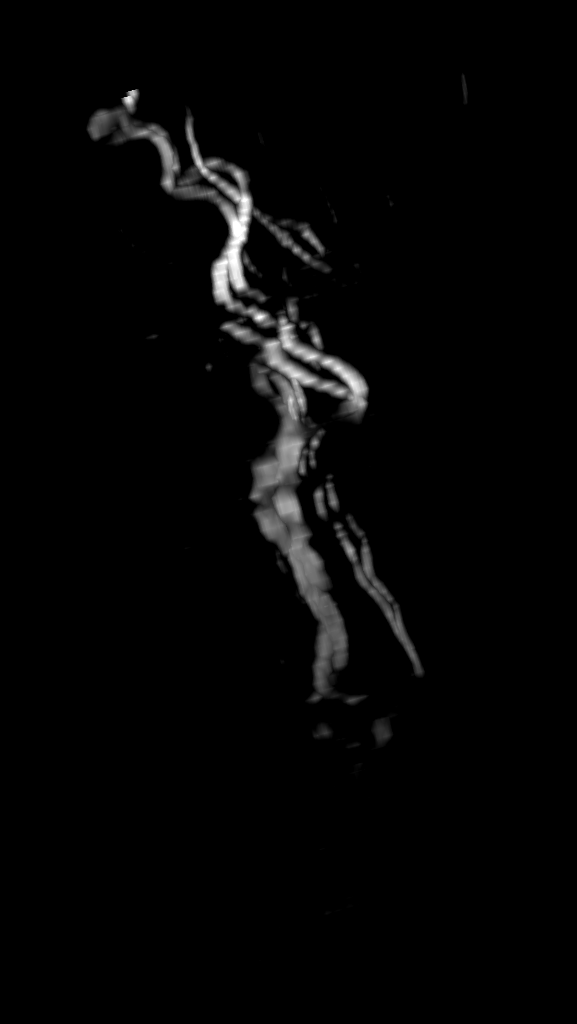
[im 3/3]
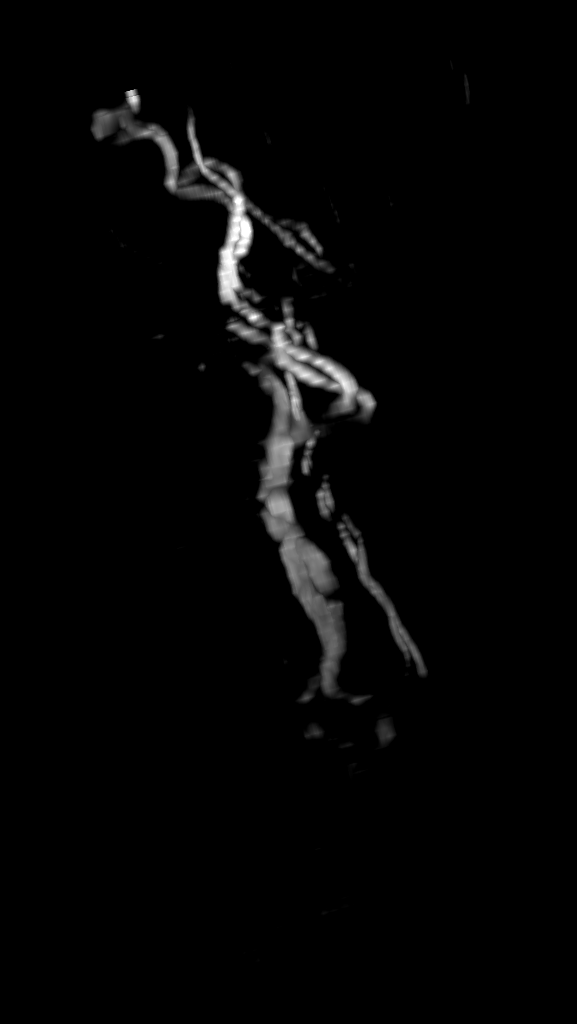

[8 of 9 positions shown; findings below may reference images not displayed]

FINDINGS: MRA NECK FINDINGS

Image quality degraded by motion.  Two attempts were made.

Carotid and vertebral arteries have antegrade flow bilaterally and
are patent. No significant carotid or vertebral artery stenosis.

MRA HEAD FINDINGS

Motion degraded study.  Suboptimal vascular detail.

Internal carotid artery patent bilaterally. Anterior and middle
cerebral arteries are patent with irregularity noted in the anterior
cerebral arteries bilaterally felt to be due to motion. No large
vessel occlusion or aneurysm.

Both vertebral arteries patent to the basilar. Basilar patent.
Posterior cerebral arteries patent bilaterally fetal origin
bilaterally. No aneurysm
IMPRESSION: Motion degraded study of the neck and head

No significant intracranial or extracranial stenosis. No
intracranial large vessel occlusion.

## 2022-05-18 IMAGING — MR MR MRA NECK W/O CM
4 series · 45 of 48 positions shown · non-contrast
Comparison: MRI head [DATE]

CLINICAL DATA: Stroke

EXAM:
MRA NECK WITHOUT CONTRAST
MRA HEAD WITHOUT CONTRAST
TECHNIQUE: Angiographic images of the Circle of Willis were acquired using MRA
technique without intravenous contrast.

[Series 14: tof_fl3d_tra_iso · axial · 0.6mm · 0.52mm/px · z∈[-230,-153]mm · 26 of 133 slices shown]
[im 1/133]
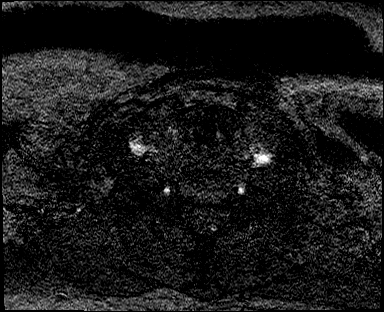
[im 6/133]
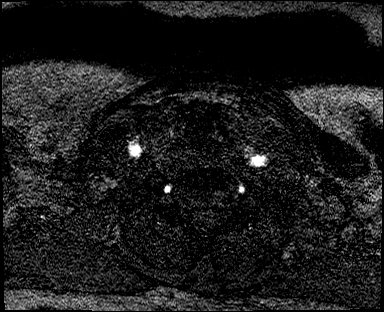
[im 11/133]
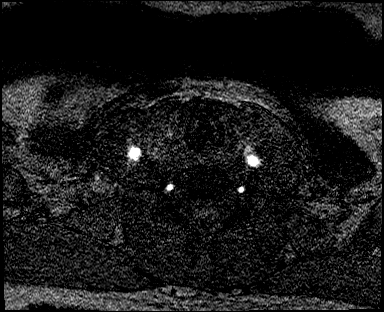
[im 16/133]
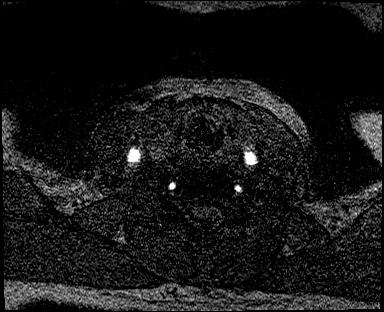
[im 22/133]
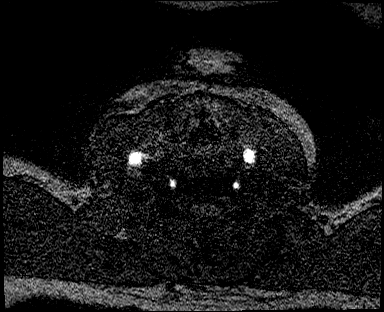
[im 27/133]
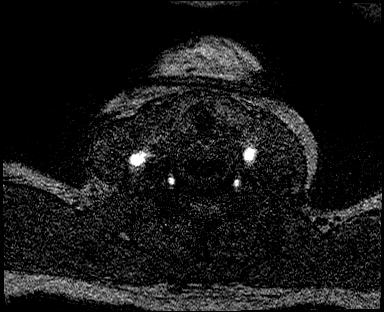
[im 32/133]
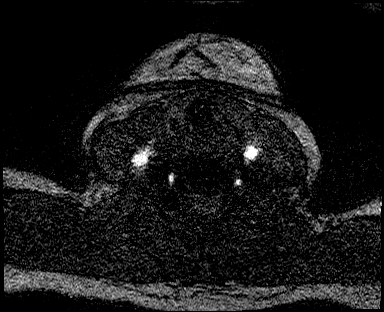
[im 37/133]
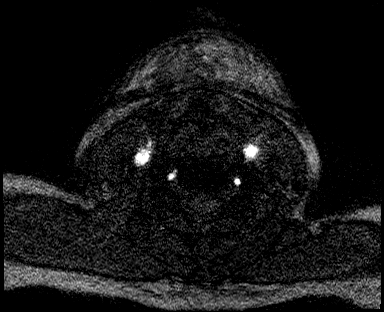
[im 43/133]
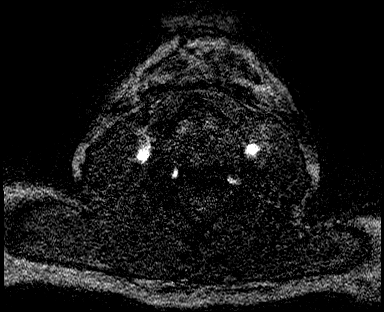
[im 48/133]
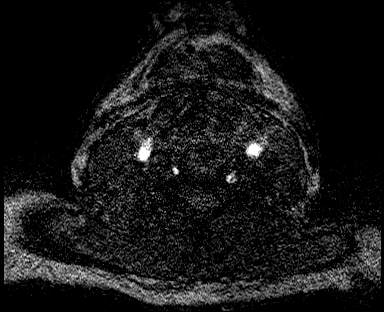
[im 53/133]
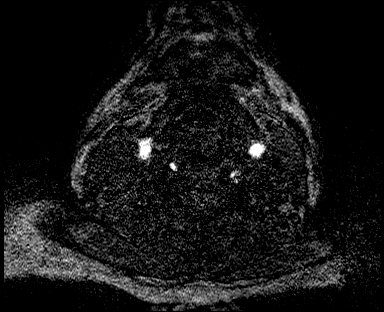
[im 59/133]
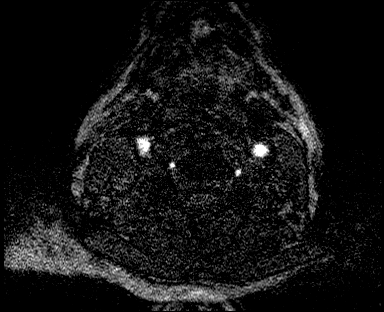
[im 64/133]
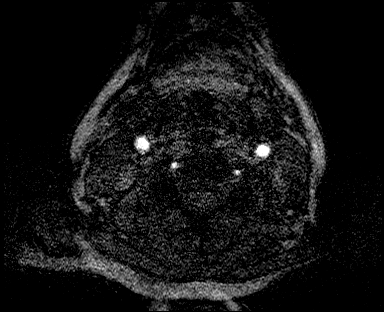
[im 69/133]
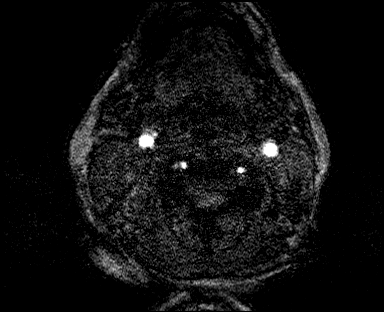
[im 74/133]
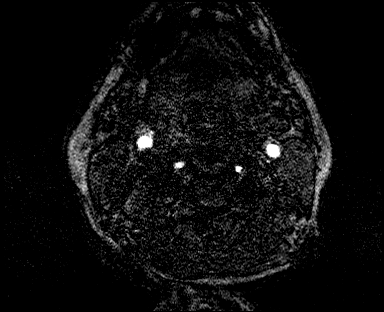
[im 80/133]
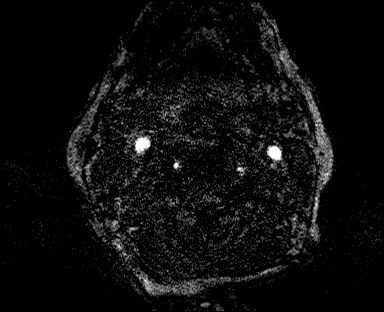
[im 85/133]
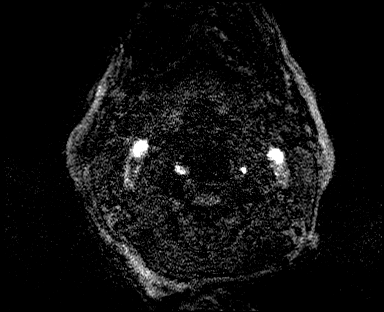
[im 90/133]
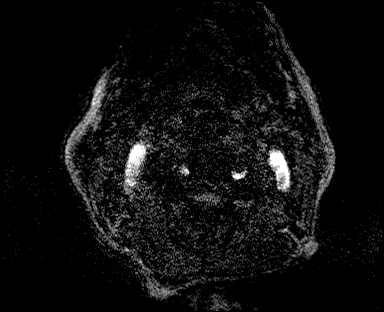
[im 96/133]
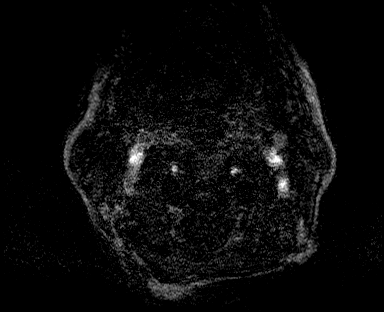
[im 101/133]
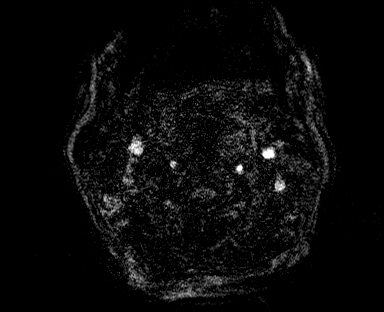
[im 106/133]
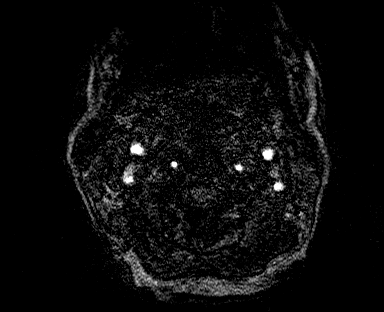
[im 111/133]
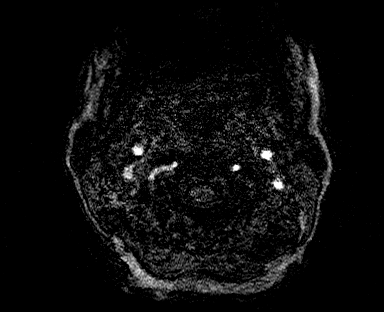
[im 117/133]
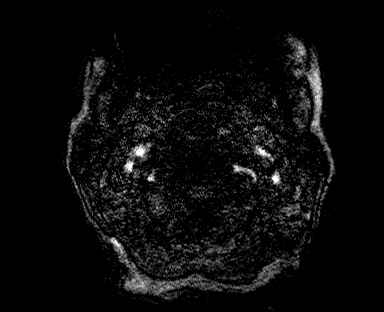
[im 122/133]
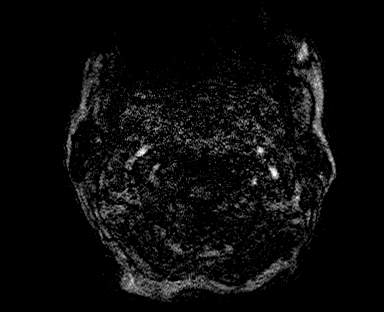
[im 127/133]
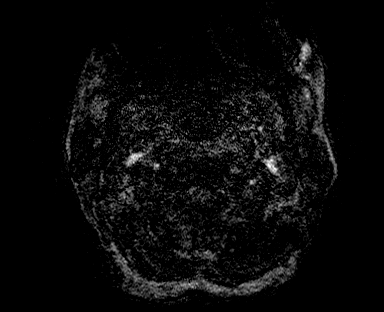
[im 133/133]
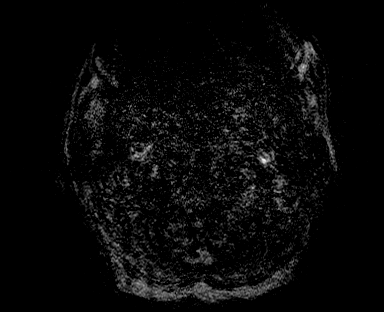

[Series 18: tof_fl2d_tra · axial · 3.0mm · 0.78mm/px · z∈[-300,-112]mm · 17 of 100 slices shown]
[im 1/100]
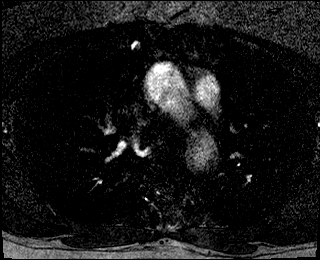
[im 6/100]
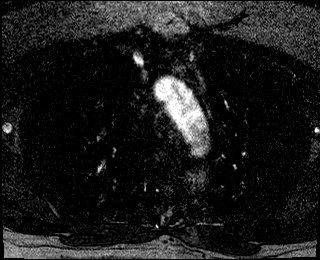
[im 11/100]
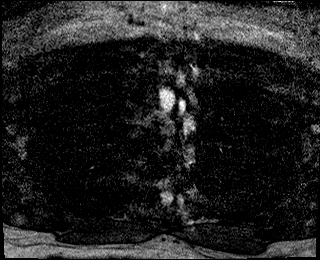
[im 16/100]
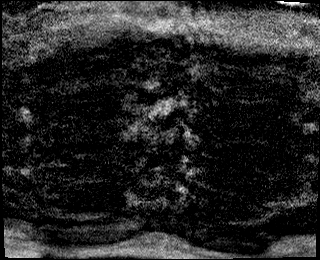
[im 21/100]
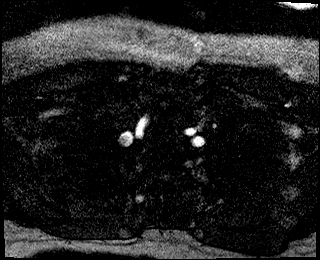
[im 27/100]
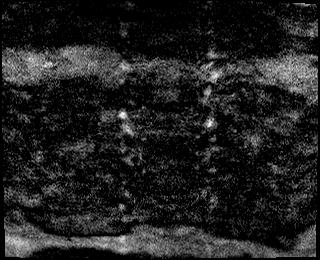
[im 32/100]
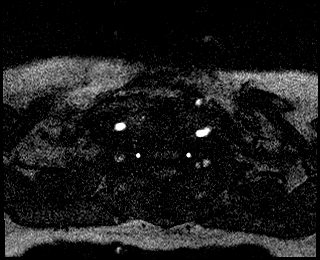
[im 37/100]
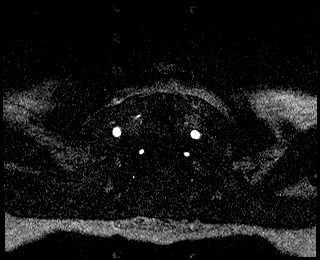
[im 42/100]
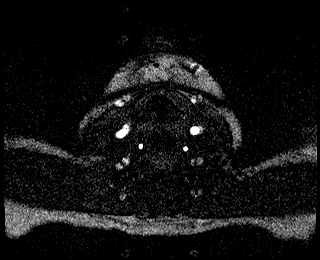
[im 47/100]
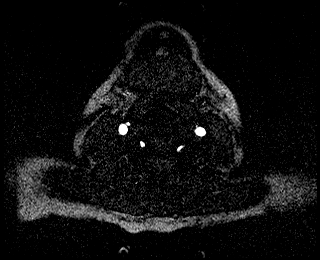
[im 53/100]
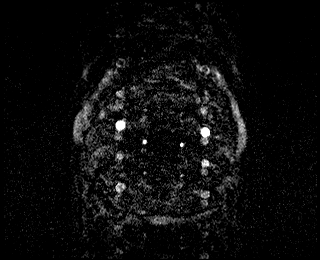
[im 58/100]
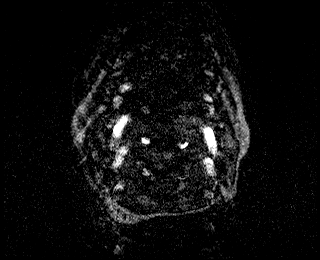
[im 63/100]
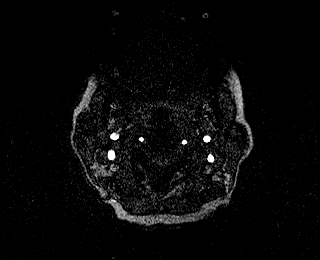
[im 68/100]
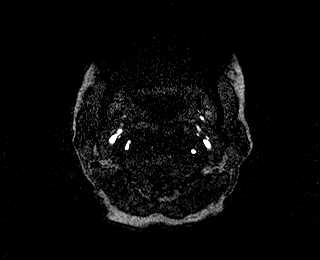
[im 73/100]
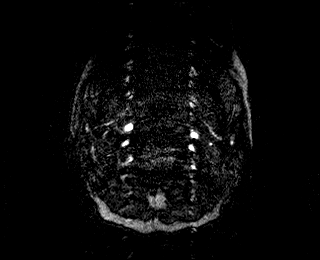
[im 84/100]
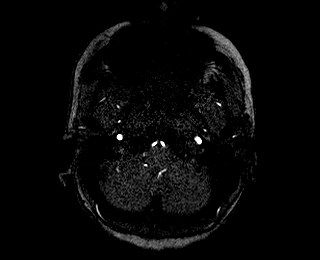
[im 94/100]
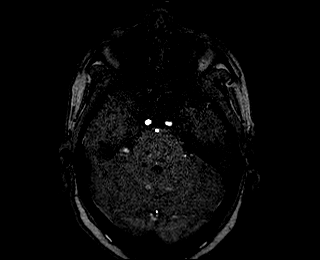

[Series 21: tof_fl2d_tra_mip_tra · axial · 210.9mm · 0.78mm/px · 1 of 1 slices shown]
[im 1/1]
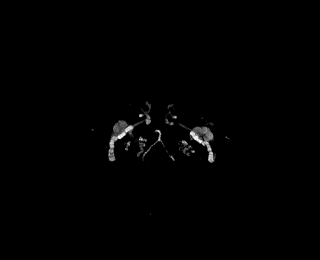

[Series 22: tof_fl2d_tra_mip_radials · oblique · 0.78mm/px · 1 of 4 slices shown]
[im 1/4]
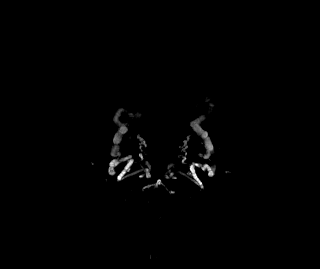

[45 of 48 positions shown; findings below may reference images not displayed]

FINDINGS: MRA NECK FINDINGS

Image quality degraded by motion.  Two attempts were made.

Carotid and vertebral arteries have antegrade flow bilaterally and
are patent. No significant carotid or vertebral artery stenosis.

MRA HEAD FINDINGS

Motion degraded study.  Suboptimal vascular detail.

Internal carotid artery patent bilaterally. Anterior and middle
cerebral arteries are patent with irregularity noted in the anterior
cerebral arteries bilaterally felt to be due to motion. No large
vessel occlusion or aneurysm.

Both vertebral arteries patent to the basilar. Basilar patent.
Posterior cerebral arteries patent bilaterally fetal origin
bilaterally. No aneurysm
IMPRESSION: Motion degraded study of the neck and head

No significant intracranial or extracranial stenosis. No
intracranial large vessel occlusion.

## 2022-05-18 IMAGING — MR MR MRA HEAD W/O CM
1 series · 20 of 48 positions shown · non-contrast
Comparison: MRI head [DATE]

CLINICAL DATA: Stroke

EXAM:
MRA NECK WITHOUT CONTRAST
MRA HEAD WITHOUT CONTRAST
TECHNIQUE: Angiographic images of the Circle of Willis were acquired using MRA
technique without intravenous contrast.

[Series 5: 3d cow · axial · 0.5mm · 0.41mm/px · z∈[-144,-58]mm · 20 of 188 slices shown]
[im 1/188]
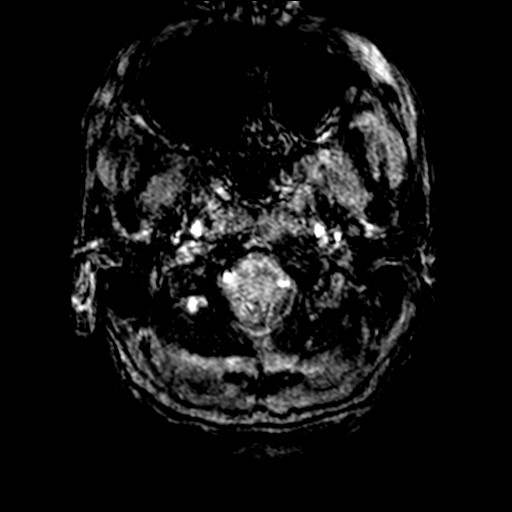
[im 4/188]
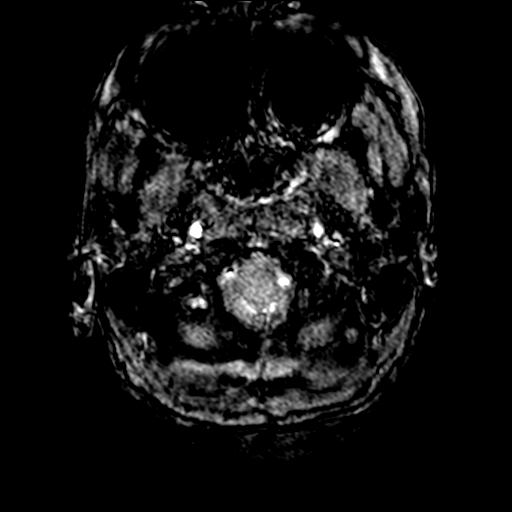
[im 8/188]
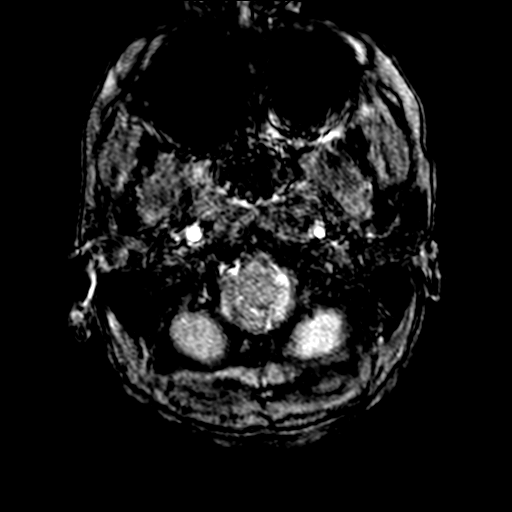
[im 12/188]
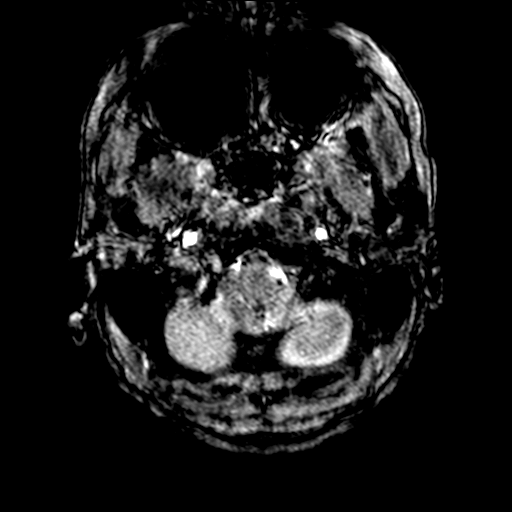
[im 16/188]
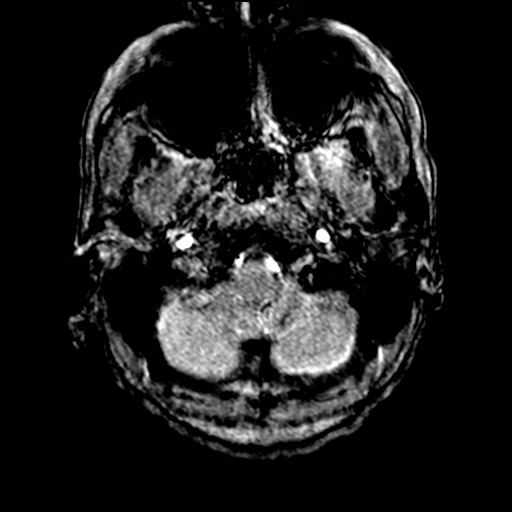
[im 20/188]
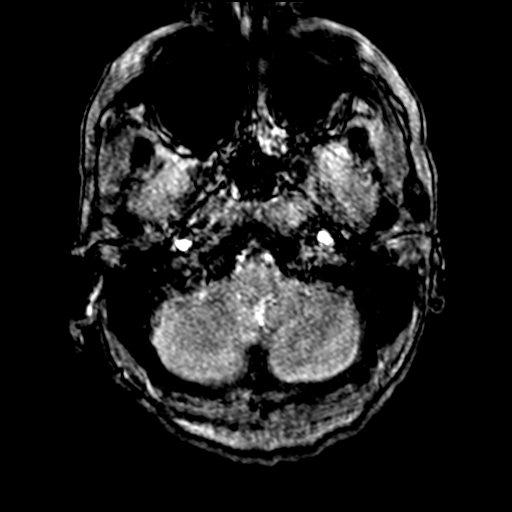
[im 24/188]
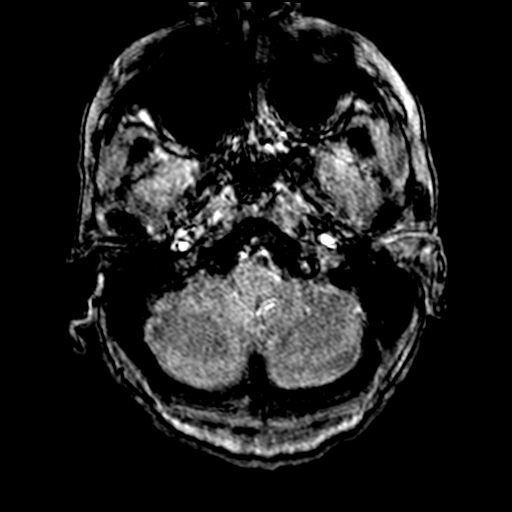
[im 28/188]
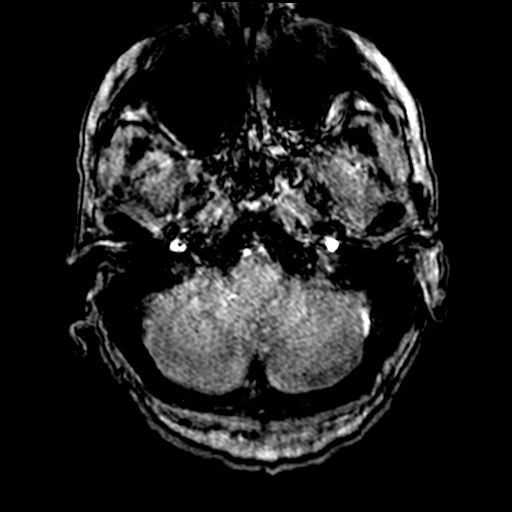
[im 32/188]
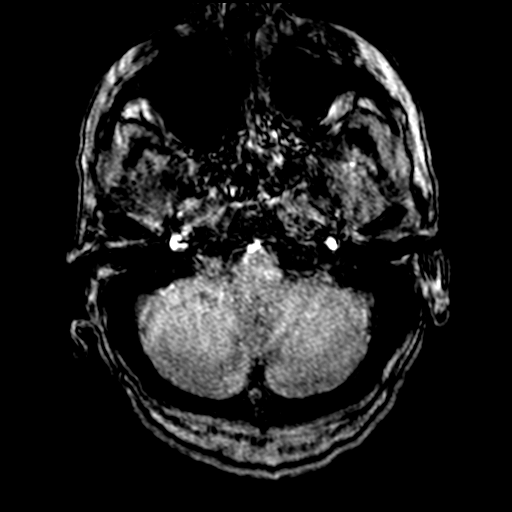
[im 36/188]
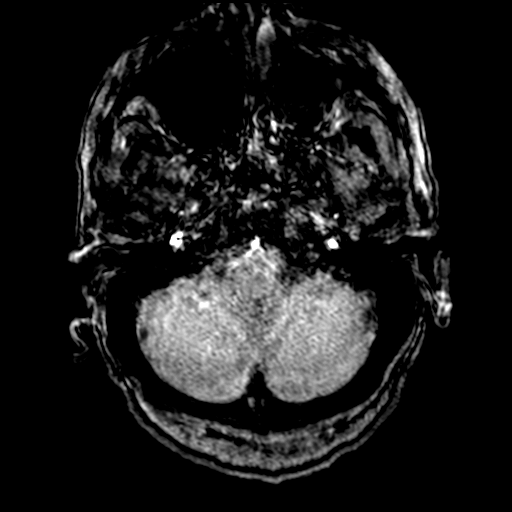
[im 40/188]
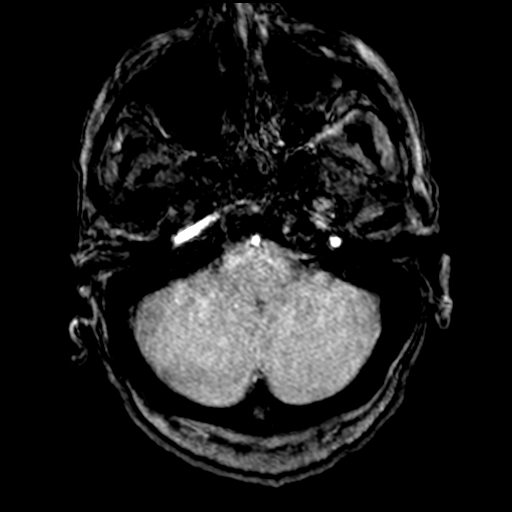
[im 44/188]
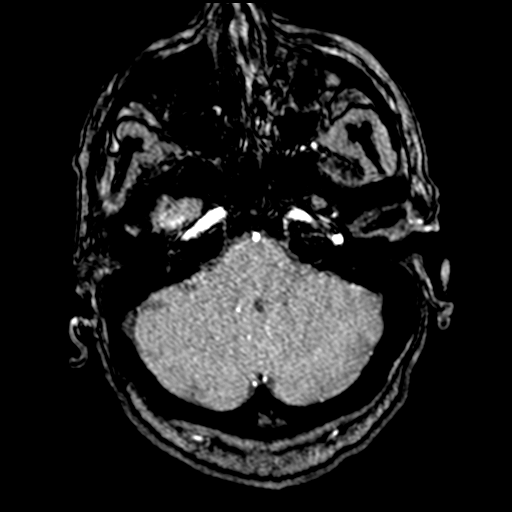
[im 60/188]
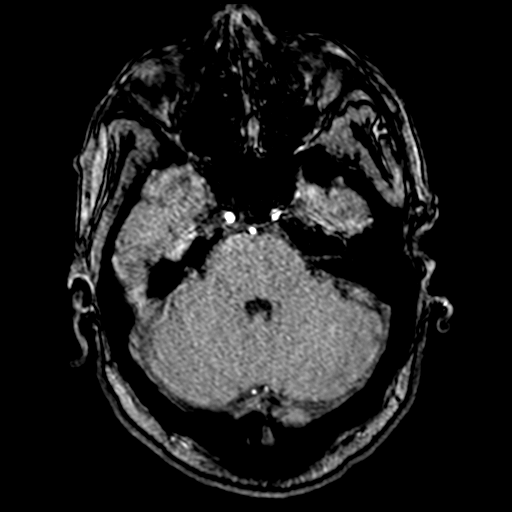
[im 84/188]
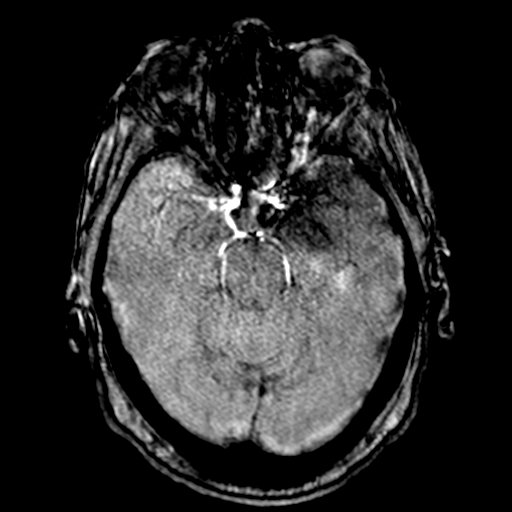
[im 96/188]
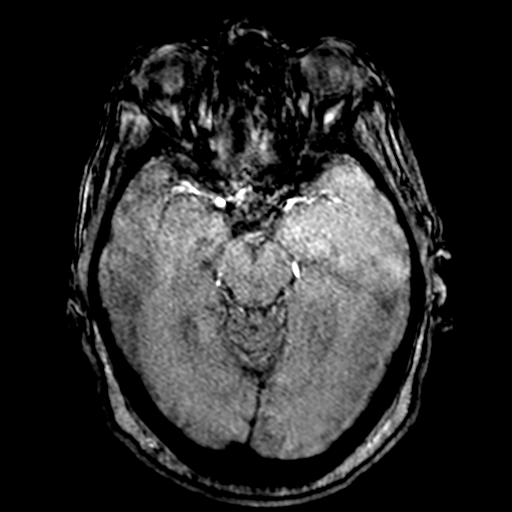
[im 108/188]
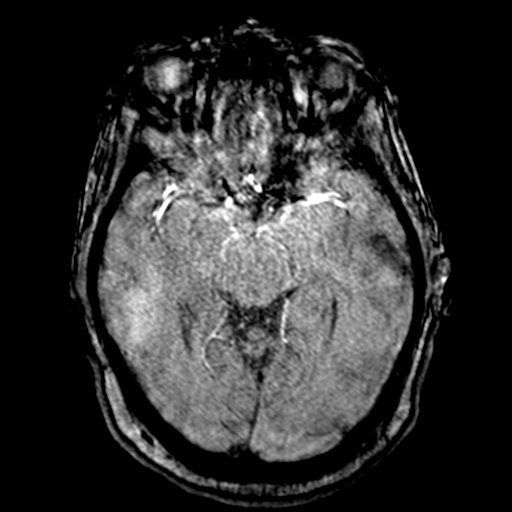
[im 132/188]
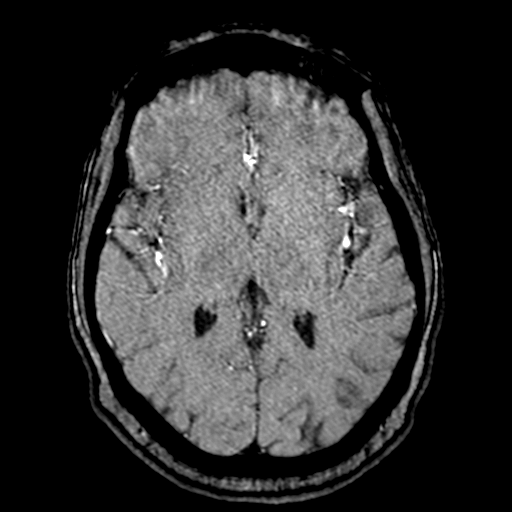
[im 156/188]
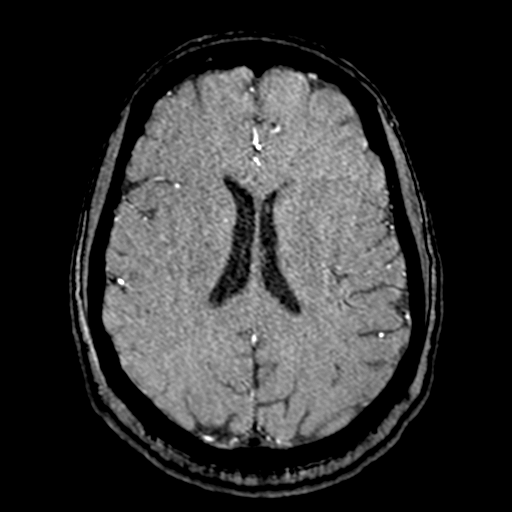
[im 160/188]
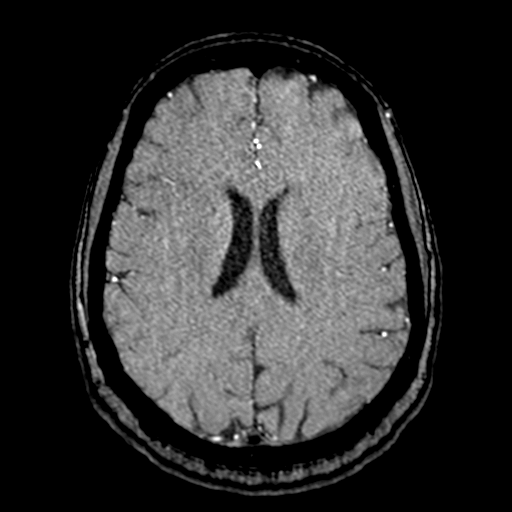
[im 180/188]
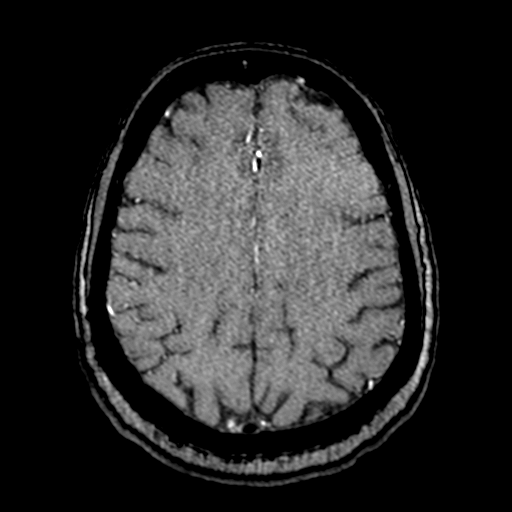

[20 of 48 positions shown; findings below may reference images not displayed]

FINDINGS: MRA NECK FINDINGS

Image quality degraded by motion.  Two attempts were made.

Carotid and vertebral arteries have antegrade flow bilaterally and
are patent. No significant carotid or vertebral artery stenosis.

MRA HEAD FINDINGS

Motion degraded study.  Suboptimal vascular detail.

Internal carotid artery patent bilaterally. Anterior and middle
cerebral arteries are patent with irregularity noted in the anterior
cerebral arteries bilaterally felt to be due to motion. No large
vessel occlusion or aneurysm.

Both vertebral arteries patent to the basilar. Basilar patent.
Posterior cerebral arteries patent bilaterally fetal origin
bilaterally. No aneurysm
IMPRESSION: Motion degraded study of the neck and head

No significant intracranial or extracranial stenosis. No
intracranial large vessel occlusion.

## 2022-05-18 IMAGING — MR MR MRA HEAD W/O CM
1 series · 1 of 1 positions shown · non-contrast
Comparison: MRI head [DATE]

CLINICAL DATA: Stroke

EXAM:
MRA NECK WITHOUT CONTRAST
MRA HEAD WITHOUT CONTRAST
TECHNIQUE: Angiographic images of the Circle of Willis were acquired using MRA
technique without intravenous contrast.

[Series 1036: mip rotate · sagittal · 0.4mm · 0.11mm/px · 1 of 1 slices shown]
[im 1/1]
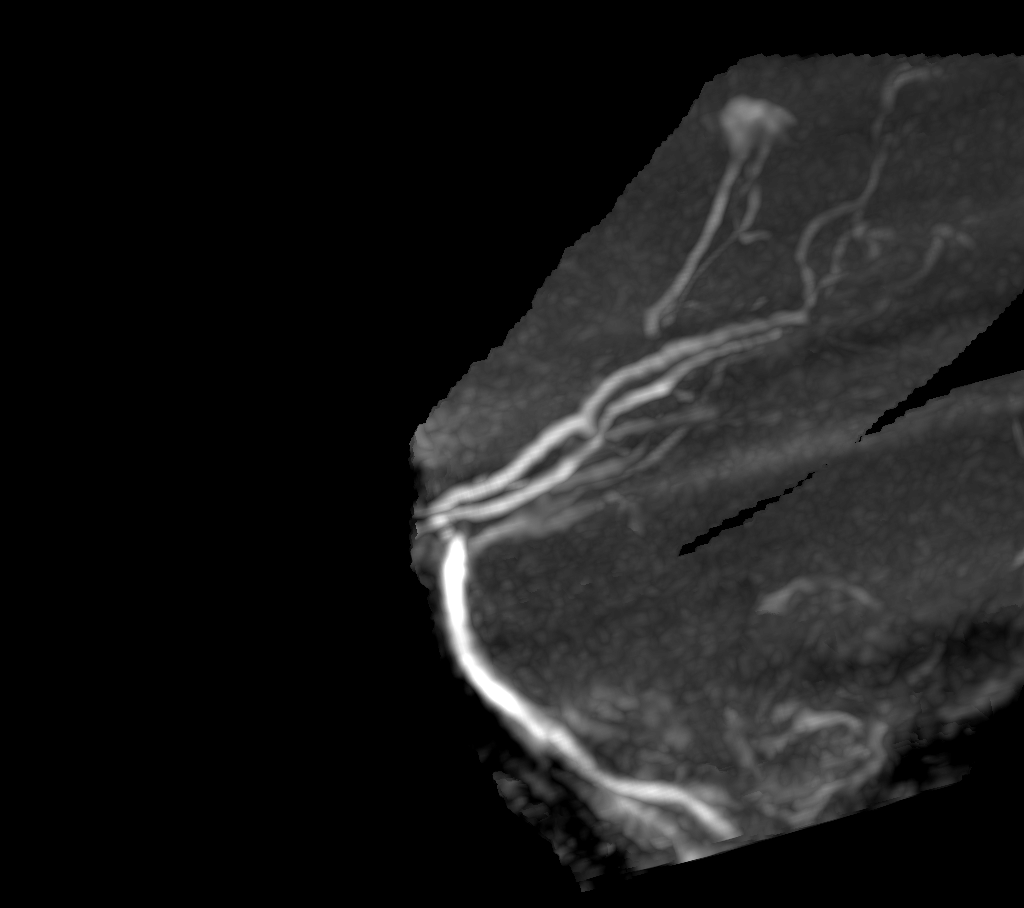

[1 of 1 positions shown; findings below may reference images not displayed]

FINDINGS: MRA NECK FINDINGS

Image quality degraded by motion.  Two attempts were made.

Carotid and vertebral arteries have antegrade flow bilaterally and
are patent. No significant carotid or vertebral artery stenosis.

MRA HEAD FINDINGS

Motion degraded study.  Suboptimal vascular detail.

Internal carotid artery patent bilaterally. Anterior and middle
cerebral arteries are patent with irregularity noted in the anterior
cerebral arteries bilaterally felt to be due to motion. No large
vessel occlusion or aneurysm.

Both vertebral arteries patent to the basilar. Basilar patent.
Posterior cerebral arteries patent bilaterally fetal origin
bilaterally. No aneurysm
IMPRESSION: Motion degraded study of the neck and head

No significant intracranial or extracranial stenosis. No
intracranial large vessel occlusion.

## 2022-05-18 MED ORDER — ACETAMINOPHEN 325 MG PO TABS: 650.0000 mg | ORAL_TABLET | ORAL | Status: DC | PRN

## 2022-05-18 MED ORDER — ACETAMINOPHEN 160 MG/5ML PO SOLN: 650.0000 mg | ORAL | Status: DC | PRN

## 2022-05-18 MED ORDER — LORAZEPAM 2 MG/ML IJ SOLN
1.0000 mg | Freq: Once | INTRAMUSCULAR | Status: AC
Start: 2022-05-18 — End: 2022-05-18
  Administered 2022-05-18: 1 mg via INTRAVENOUS
  Filled 2022-05-18: qty 1

## 2022-05-18 MED ORDER — SENNOSIDES-DOCUSATE SODIUM 8.6-50 MG PO TABS: 1.0000 | ORAL_TABLET | Freq: Every evening | ORAL | Status: DC | PRN

## 2022-05-18 MED ORDER — ENOXAPARIN SODIUM 40 MG/0.4ML IJ SOSY
40.0000 mg | PREFILLED_SYRINGE | INTRAMUSCULAR | Status: DC
  Administered 2022-05-18 – 2022-05-19 (×2): 40 mg via SUBCUTANEOUS
  Filled 2022-05-18 (×2): qty 0.4

## 2022-05-18 MED ORDER — ASPIRIN 325 MG PO TABS
325.0000 mg | ORAL_TABLET | Freq: Every day | ORAL | Status: DC
  Administered 2022-05-18 – 2022-05-19 (×2): 325 mg via ORAL
  Filled 2022-05-18 (×2): qty 1

## 2022-05-18 MED ORDER — SODIUM CHLORIDE 0.9 % IV BOLUS
1000.0000 mL | Freq: Once | INTRAVENOUS | Status: AC
Start: 2022-05-18 — End: 2022-05-18
  Administered 2022-05-18: 1000 mL via INTRAVENOUS

## 2022-05-18 MED ORDER — STROKE: EARLY STAGES OF RECOVERY BOOK
Freq: Once | Status: AC
  Filled 2022-05-18: qty 1

## 2022-05-18 MED ORDER — ACETAMINOPHEN 650 MG RE SUPP: 650.0000 mg | RECTAL | Status: DC | PRN

## 2022-05-18 NOTE — ED Notes (Signed)
Pt returned from MRI °

## 2022-05-18 NOTE — ED Notes (Signed)
Taken to MRI by transporter.

## 2022-05-18 NOTE — Progress Notes (Signed)
  Echocardiogram 2D Echocardiogram has been performed.  Fidel Levy 05/18/2022, 3:36 PM

## 2022-05-18 NOTE — ED Provider Notes (Signed)
[facility] EMERGENCY DEPARTMENT Provider Note   CSN: [id number] Arrival date & time: 05/18/22  1010     History  Chief Complaint  Patient presents with   Altered Mental Status    Monica Bauer is a 66 y.o. female.  Pt is a 66 yo female with pmhx significant for DM, BRCA, and HTN.  Pt has had worsening memory lately.  She was seen at [facility] yesterday.  CT head and labs nl yesterday.  They d/c her with instructions to f/u with neurology.  This morning, pt was not acting right, so family called EMS to take her to the ED for further eval.  Her family said she was clapping and acting bizarrely this am.  Pt does not recall this happening.  She has no complaints and said she feels fine.  Pt's family took a video of this event.  She is lying on the ground face down, and slapping her right arm on the carpet.  She is making a humming noise.  ? Seizure.       Home Medications Prior to Admission medications   Medication Sig Start Date End Date Taking? Authorizing Provider  amLODipine (NORVASC) 10 MG tablet Take 10 mg by mouth daily. 04/07/15 01/15/20  [provider]  Cholecalciferol (VITAMIN D3 PO) Take 1,000 Units by mouth daily.     [provider]  glipiZIDE (GLUCOTROL XL) 5 MG 24 hr tablet Take 5 mg by mouth daily with breakfast.    [provider]  levETIRAcetam (KEPPRA) 250 MG tablet Take 1 tablet (250 mg total) by mouth 2 (two) times daily for 1 day. 10/28/19 10/29/19  Harold Hedge, MD  levETIRAcetam (KEPPRA) 500 MG tablet Take 1 tablet (500 mg total) by mouth 2 (two) times daily. 10/30/19 11/29/19  Harold Hedge, MD  Lidocaine (HM LIDOCAINE PATCH) 4 % PTCH Apply 1 patch topically every 12 (twelve) hours as needed. 03/07/22   Loni Beckwith, PA-C  Multiple Vitamin (MULTIVITAMIN WITH MINERALS) TABS tablet Take 1 tablet by mouth daily.    [provider]      Allergies    Other    Review of Systems   Review of  Systems  Neurological:        Memory loss  Psychiatric/Behavioral:  Positive for behavioral problems.   All other systems reviewed and are negative.   Physical Exam Updated Vital Signs BP (!) 162/84   Pulse 63   Temp 98 F (36.7 C) (Oral)   Resp 15   SpO2 95%  Physical Exam Vitals and nursing note reviewed.  Constitutional:      Appearance: Normal appearance.  HENT:     Head: Normocephalic and atraumatic.     Right Ear: External ear normal.     Left Ear: External ear normal.     Nose: Nose normal.     Mouth/Throat:     Mouth: Mucous membranes are moist.     Pharynx: Oropharynx is clear.  Eyes:     Extraocular Movements: Extraocular movements intact.     Conjunctiva/sclera: Conjunctivae normal.     Pupils: Pupils are equal, round, and reactive to light.  Cardiovascular:     Rate and Rhythm: Normal rate and regular rhythm.     Pulses: Normal pulses.     Heart sounds: Normal heart sounds.  Pulmonary:     Effort: Pulmonary effort is normal.     Breath sounds: Normal breath sounds.  Abdominal:  General: Abdomen is flat. Bowel sounds are normal.     Palpations: Abdomen is soft.  Musculoskeletal:        General: Normal range of motion.     Cervical back: Normal range of motion and neck supple.  Skin:    General: Skin is warm.     Capillary Refill: Capillary refill takes less than 2 seconds.  Neurological:     General: No focal deficit present.     Mental Status: She is alert.     Comments: Oriented to person and place, but not to time.  Psychiatric:        Mood and Affect: Mood normal.        Behavior: Behavior normal.     ED Results / Procedures / Treatments   Labs (all labs ordered are listed, but only abnormal results are displayed) Labs Reviewed  CBC - Abnormal; Notable for the following components:      Result Value   WBC 11.2 (*)    All other components within normal limits  COMPREHENSIVE METABOLIC PANEL - Abnormal; Notable for the following  components:   CO2 19 (*)    Glucose, Bld 149 (*)    Creatinine, Ser 1.09 (*)    AST 42 (*)    GFR, Estimated 56 (*)    All other components within normal limits  RESP PANEL BY RT-PCR (FLU A&B, COVID) ARPGX2  ETHANOL  PROTIME-INR  APTT  DIFFERENTIAL  RAPID URINE DRUG SCREEN, HOSP PERFORMED  URINALYSIS, ROUTINE W REFLEX MICROSCOPIC    EKG EKG Interpretation  Date/Time:  Wednesday May 18 2022 10:19:18 EDT Ventricular Rate:  87 PR Interval:  163 QRS Duration: 104 QT Interval:  423 QTC Calculation: 509 R Axis:   -16 Text Interpretation: Sinus rhythm Borderline left axis deviation RSR' in V1 or V2, right VCD or RVH ST elevation, consider inferior injury Prolonged QT interval No significant change since last tracing Confirmed by Isla Pence [phone]) on 05/18/2022 10:57:51 AM  Radiology MR BRAIN WO CONTRAST  Result Date: 05/18/2022 CLINICAL DATA:  Acute neuro deficit. EXAM: MRI HEAD WITHOUT CONTRAST TECHNIQUE: Multiplanar, multiecho pulse sequences of the brain and surrounding structures were obtained without intravenous contrast. COMPARISON:  CT head 05/17/2022 FINDINGS: Brain: Small focus of restricted diffusion in the right frontal parietal white matter measuring 5 mm. This is consistent with acute infarct. No other acute infarct Moderate white matter changes consistent with chronic microvascular ischemia. Negative for hemorrhage, mass, or hydrocephalus Vascular: Normal arterial flow voids Skull and upper cervical spine: Negative Sinuses/Orbits: Mucosal edema paranasal sinuses especially the left maxillary sinus. Mastoid clear. Negative orbit Other: None IMPRESSION: 5 mm acute up infarct right frontal parietal white matter. Moderate chronic microvascular ischemic change. Electronically Signed   By: Franchot Gallo M.D.   On: 05/18/2022 12:36   DG Chest Portable 1 View  Result Date: 05/17/2022 CLINICAL DATA:  Altered mental status. EXAM: PORTABLE CHEST 1 VIEW COMPARISON:  Chest  radiograph dated October 17, 2019 FINDINGS: The heart is mildly enlarged, unchanged. Both lungs are clear. The visualized skeletal structures are unremarkable. Surgical clips in the right axilla. IMPRESSION: No active disease. Electronically Signed   By: Keane Police D.O.   On: 05/17/2022 13:37   CT Head Wo Contrast  Result Date: 05/17/2022 CLINICAL DATA:  Short-term memory loss, mental status change. EXAM: CT HEAD WITHOUT CONTRAST TECHNIQUE: Contiguous axial images were obtained from the base of the skull through the vertex without intravenous contrast. RADIATION DOSE REDUCTION: This exam  was performed according to the departmental dose-optimization program which includes automated exposure control, adjustment of the mA and/or kV according to patient size and/or use of iterative reconstruction technique. COMPARISON:  CT examination dated October 17, 2019 FINDINGS: Brain: No evidence of acute infarction, hemorrhage, hydrocephalus, extra-axial collection or mass lesion/mass effect. Scattered area of low attenuation in the periventricular white matter presumed chronic microvascular ischemic changes. Vascular: No hyperdense vessel or unexpected calcification. Skull: Normal. Negative for fracture or focal lesion. Sinuses/Orbits: No acute finding. Other: None. IMPRESSION: No acute intracranial abnormality. Electronically Signed   By: Keane Police D.O.   On: 05/17/2022 13:36    Procedures Procedures    Medications Ordered in ED Medications  aspirin tablet 325 mg (has no administration in time range)  sodium chloride 0.9 % bolus 1,000 mL (1,000 mLs Intravenous New Bag/Given 05/18/22 1059)  LORazepam (ATIVAN) injection 1 mg (1 mg Intravenous Given 05/18/22 1059)    ED Course/ Medical Decision Making/ A&P                           Medical Decision Making Amount and/or Complexity of Data Reviewed Labs: ordered. Radiology: ordered.  Risk OTC drugs. Prescription drug management. Decision regarding  hospitalization.   This patient presents to the ED for concern of altered ms, this involves an extensive number of treatment options, and is a complaint that carries with it a high risk of complications and morbidity.  The differential diagnosis includes cva, brain met, electrolyte abn, infection   Co morbidities that complicate the patient evaluation  DM, BRCA, and HTN   Additional history obtained:  Additional history obtained from epic chart review External records from outside source obtained and reviewed including EMS report, family   Lab Tests:  I Ordered, and personally interpreted labs.  The pertinent results include:  cbc neg, inr 1.2, cmp nl, etoh neg   Imaging Studies ordered:  I ordered imaging studies including MRI brain  I independently visualized and interpreted imaging which showed  IMPRESSION:  5 mm acute up infarct right frontal parietal white matter.    Moderate chronic microvascular ischemic change.   I agree with the radiologist interpretation   Cardiac Monitoring:  The patient was maintained on a cardiac monitor.  I personally viewed and interpreted the cardiac monitored which showed an underlying rhythm of: nsr   Medicines ordered and prescription drug management:  I ordered medication including ativan  for MRI  Reevaluation of the patient after these medicines showed that the patient improved I have reviewed the patients home medicines and have made adjustments as needed   Test Considered:  MRI   Critical Interventions:  MRI   Consultations Obtained:  I requested consultation with the neurologist (Dr. Quinn Axe),  and discussed lab and imaging findings as well as pertinent plan - she will see pt in consult.   Pt d/w IMTS for admission.   Problem List / ED Course:  CVA:  new CVA seen on MRI.  ASA given.  Neurology will consult.  Medicine admit for stroke work up. Memory loss:  possible dementia.  This is unlikely to be caused by the  acute stroke. Bizarre event this am:  ? Seizure vs behavioral?  Neurology to consult.   Reevaluation:  After the interventions noted above, I reevaluated the patient and found that they have :improved   Social Determinants of Health:  Lives at home   Dispostion:  After consideration of the diagnostic results  and the patients response to treatment, I feel that the patent would benefit from admission.          Final Clinical Impression(s) / ED Diagnoses Final diagnoses:  Cerebrovascular accident (CVA), unspecified mechanism ([place])    Rx / [place] Orders ED Discharge Orders     None         Isla Pence, MD 05/18/22 1336

## 2022-05-18 NOTE — ED Notes (Signed)
Received verbal report from Awilda Metro

## 2022-05-18 NOTE — ED Notes (Signed)
Echo at bedside

## 2022-05-18 NOTE — H&P (Cosign Needed)
Date: 05/18/2022               Patient Name:  Monica Bauer MRN: [id number]  DOB: 1956-02-04 Age / Sex: 66 y.o., female   PCP: Chesley Mires, MD         Medical Service: Internal Medicine Teaching Service         Attending Physician: Dr. Velna Ochs, MD    First Contact: Dr. Marlou Sa Pager: [phone]  Second Contact: Dr. Collene Gobble Pager: [phone]       After Hours (After 5p/  First Contact Pager: [phone]  weekends / holidays): Second Contact Pager: [phone]   Chief Complaint: confusion  History of Present Illness: Monica Bauer is a 66 y.o. F with PMH of breast cancer, T2DM, and HTN who presents with at least 2 days of behavioral disturbance on top of existing dementia. She has been witnessed to have episodes of 1. Sprawling out on the ground face-down, grunting, slapping herself on the legs; 2. Sitting on the ground, legs curled up, rocking back and forth while grunting and making incomprehensible sounds and hitting herself on the legs; 3. Pacing around the room making incomprehensible sounds and clapping. She has had progressive memory issues over the last 5+ years including forgetting to pay her mortgage and almost having her house foreclosed on, paranoia with several lock changes in her home and withholding information from several family members, refusing to seek medical care due to distrust and paranoia. She recently lost two close cousins 2 weeks ago. 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  Yesterday her sister in law, Monica Bauer, took her to be evaluated because of these behavioral concerns at which point she was told to seek outpatient follow-up and discharged home. This morning when Monica Bauer was sprawled out on the ground, her sister-in-law asked her to come over immediately at which point the decision was made to call EMS. During these episodes she has not had witnessed shaking, tongue biting, bowel or bladder incontinence.  She is independent of ADLs and IADLs at baseline.  ED Course: CBC with mild leukocytosis at 11.2; CMP with bicarb 19, Scr 1.09, AST 42, GFR 56; ethanol <10. MRI showed acute 5 mm infarct in R frontal parietal white matter with moderate chronic microvascular ischemic change. Given 1 mg Ativan prior to MRI.   Meds: None No outpatient medications have been marked as taking for the 05/18/22 encounter [facility] Encounter).   Allergies: Allergies as of 05/18/2022 - Review Complete 05/18/2022  Allergen Reaction Noted   Other  09/10/2014   Past Medical History:  Diagnosis Date   Breast cancer ([place])    Diabetes mellitus without complication ([place])    Hypertension     Family History: Unable to obtain.  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 She is independent of ADLs/IADLs at baseline.  Review of Systems: A complete ROS was negative except as per HPI.   Physical Exam: Blood pressure (!) 185/86, pulse 77, temperature 98 F (36.7 C), temperature source Oral, resp. rate 15, SpO2 99 %. Constitutional: Appears older than stated age, somnolent in bed. No acute distress. Cardio:Regular rate and rhythm. No murmurs, rubs, or gallops. Pulm:Clear to anterior auscultation bilaterally. Abdomen:Soft, nontender, nondistended. RXV:QMGQQPYP for extremity edema. Skin:Warm and dry. Neuro: Mental Status: Patient is somnolent but arousable, oriented x3 No signs of aphasia or neglect Cranial Nerves: II: Pupils equal, round, and reactive  to light.   III,IV, VI: Unable to follow commands to assess EOMI. V: Facial sensation is symmetric to light touch and temperature. VII: Facial movement is symmetric.  VIII: Hearing is intact to voice XI: Shoulder shrug is symmetric. XII: Tongue is midline without atrophy or fasciculations.  Motor: Normal effort thorughout, at least 4/5 bilateral UE, 4/5 bilateral LE Sensory: Sensation is grossly intact bilateral UE & LE Psych:Unable  to assess.  EKG: personally reviewed my interpretation is NSR with borderline L axis deviation, RSR' in V1 or V2 with possible RVH or ventricular conduction defect, ST elevation in inferior leads, prolonged QT interval. No significant change since last tracing.  MRI brain: 5 mm acute infarct of R frontal parietal white matter; moderate chronic microvascular ischemic change.  Assessment & Plan by Problem: Principal Problem:   Acute CVA (cerebrovascular accident) ([place]) Active Problems:   Acute encephalopathy   Hypertension   Hyperglycemia  Acute R frontal parietal CVA Patient was found to have acute CVA on MRI at presentation. Her memory disturbances have been progressing over several years without overt worsening over the last several days, however her mood disturbances have progressed over the last 2 weeks with specific episodes over the last several days. Neurological exam is grossly normal. She has a history of T2DM and hypertension, currently not on any medications. -Neurology consulted, appreciate their assistance -CVA work-up including HbA1c, lipid panel, echocardiogram, carotid dopplers, MRA brain -Start ASA 325 mg daily -Telemetry -PT/OT/SLP consult  Acute encephalopathy Patient has had progressive cognitive decline over the last at least 5 years per chart review and discussion with family. She is currently caregiver for her bedridden 66 year old mother and has had two deaths in the family in the last two weeks, both family members she was close with. Work-up thus far has revealed normal LFTs (no concern for hepatic encephalopathy), normal BUN (no concern for uremia). VBG obtained at visit yesterday did show metabolic alkalosis however I doubt that is contributing to her clinical picture. During the specific episodes which brought her to the hospital she was not witnessed to have events of shaking, tongue biting, urinary or bowel incontinence. Given the significant amount of stress that  she is under confounded by baseline dementia and new frontotemporal stroke, I am concerned for a psychiatric cause such as brief psychosis. -Will check TSH, vitamin B12, CK -UA, blood culture, urine culture to rule out infection -F/u UDS -Consider psychiatry consult if no other cause found in work-up above -Will need outpatient cognitive work-up, which has previously been recommended as well  Hypertension History of untreated hypertension. She has had intermittent hypertensive episodes with BP max 182/115; BP now trending towards normal, most recently 159/75. -CTM  Hx type 2 diabetes mellitus I am unable to see an HbA1c more recently than 2021, at that time it was 6.1%. She is not on medication for diabetes as an outpatient. Glucose of 149 at time of admission. -CTM  Dispo: Admit patient to Observation with expected length of stay less than 2 midnights.  Signed: Farrel Gordon, DO 05/18/2022, 2:56 PM  Pager: [phone] After 5pm on weekdays and 1pm on weekends: On Call pager: [phone]

## 2022-05-18 NOTE — ED Triage Notes (Signed)
From home, memory loss, forgetful, anxiety attacks. Went to [facility] yesterday with full work up. Alert and oriented x 2.

## 2022-05-18 NOTE — ED Notes (Signed)
Unable to draw the rest of lab orders, MD notified. Phlebotomist attempted with no success. Blood cultures drawn at this time. Pt refuses in and out cath but urinalysis was done yesterday at [facility] and results available in epic. MD notified.

## 2022-05-18 NOTE — Consult Note (Signed)
Neurology Consultation    Reason for Consult: memory loss, confusion, odd movements  CC: memory loss, confusion  HISTORY OF PRESENT ILLNESS   HPI  Monica Bauer is a 66 y.o. female with a PMH of sleep apnea (not using cpap), HTN, breast cancer, and diabetes who presents today with memory loss, confusion, and altered mental status. She is currently taking care of her elderly bedbound mother who is 24 years old. She does not have any reprieve help. Memory loss has been progressive for about two years, however last week it started getting noticeably worse. She was take to [facility] and discharged yesterday from the ED with a referral to outpatient neurology. At [place] [facility] Ms Gage was found by a family member laying on the floor with her arms in the arm flexing her wrists rhythmically, then laying on her stomach tapping her hands on the floor rhythmically, and then standing and clapping. During this time she did not acknowledge her family member nor did she speak. They have never noticed behavior like this in the last two years.  She was previously seen in the ED for memory loss where she was worked up for stroke, CNS infection (fluro guided LP unsuccessful), and seizures all of which were negative at that time. She underwent LTM that did not show any interictal-ictal activity, but did show that she only slept briefly during the 48 hours of EEG. She was prescribed keppra and recommended to follow up at [facility] at discharge. There is also a noted episode of AMS that was presumed to be TGA in 2015.   History is obtained from: Family member  Premorbid modified Rankin scale (mRS):  2-Slight disability-UNABLE to perform all activities but does not need assistance    ROS:  Unable to obtain due to altered mental status.   PAST MEDICAL HISTORY    Past Medical History:  Past Medical History:  Diagnosis Date   Breast cancer ([place])    Diabetes mellitus without complication ([place])    Hypertension      No family history on file. No family history on file.  Allergies:  Allergies  Allergen Reactions   Other     Pt does not know med allergy    Social History:   reports that she has quit smoking. She has never used smokeless tobacco. She reports that she does not drink alcohol and does not use drugs.    Medications (Not in a hospital admission)   EXAMINATION    Current vital signs:    05/18/2022   12:45 PM 05/18/2022   10:45 AM 05/18/2022   10:20 AM  Vitals with BMI  Systolic [phone]  Diastolic 84 90 078  Pulse 63 77 82    Examination:  GENERAL: Sleeping HEENT: - Normocephalic and atraumatic, dry mm, no lymphadenopathy, no Thyromegally LUNGS - Clear to auscultation bilaterally CV - S1S2 RRR, equal pulses bilaterally. ABDOMEN - Soft, nontender, nondistended with normoactive BS Ext: warm, well perfused, intact peripheral pulses, no pedal edema  NEURO: Exam limited by patient drowsiness- she received ativan prior to MRI Mental Status: Sleeping, falls asleep repeatedly throughout exam Language: speech is minimal, but clear.  States it is 2020 and that we are in [place], does not identify family members in the room and did not say her own name.  Cranial Nerves:  II: PERRL. Visual fields full to confrontation.  III, IV, VI: EOM in tact. Eyelids elevate symmetrically. Blinks to threat.  V: Sensation intact V1-3  symmetrically  VII: no facial asymmetry   VIII: hearing intact to voice IX, X: Palate elevates symmetrically. Phonation is normal.  CV:ELFYBOFB shrug 5/5 and symmetrical  XII: tongue is midline without fasciculations. Motor:  RUE: 4/5          LUE: 4/5    RLE: 3/5 LLE: 3/5 Tone: is normal and bulk is normal DTRs: 2+ and symmetrical throughout   Sensation- Intact to light touch bilaterally Coordination: no abnormal movements noted, no ataxia noted. Gait- deferred   LABS   I have reviewed labs in epic and the results pertinent to this  consultation are:  No results found for: "[facility]" Lab Results  Component Value Date   ALT 30 05/18/2022   AST 42 (H) 05/18/2022   ALKPHOS 54 05/18/2022   BILITOT 1.1 05/18/2022   Lab Results  Component Value Date   HGBA1C 6.7 (H) 10/18/2019   Lab Results  Component Value Date   WBC 11.2 (H) 05/18/2022   HGB 12.5 05/18/2022   HCT 38.6 05/18/2022   MCV 88.7 05/18/2022   PLT 267 05/18/2022   Lab Results  Component Value Date   VITAMINB12 5,711 (H) 10/18/2019   No results found for: "[facility]" Lab Results  Component Value Date   NA 143 05/18/2022   K 3.8 05/18/2022   CL 111 05/18/2022   CO2 19 (L) 05/18/2022     DIAGNOSTIC IMAGING/PROCEDURES   I have reviewed the images obtained:, as below    MRI- 5 mm acute up infarct right frontal parietal white matter.   ASSESSMENT/PLAN    Assessment:  66 year old female with a PMH of memory loss, HTN, sleep apnea, diabetes, and breast cancer presenting with abnormal movements and speech and confusion. She is currently under a significant amount of stress and she is the caretaker for her elderly bed bound mother. Her family has noticed a decline in her cognition, especially over the last week and a half. A small stroke was seen on imaging, she will need a stroke work up and an outpatient follow up for a cognitive work up.   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 MD to update note as needed.   Janine Ores, DNP, FNP-BC [facility] Pager: [phone]   NEUROHOSPITALIST ADDENDUM Performed a face to face diagnostic evaluation.   I have reviewed the contents of history and physical exam as documented by PA/ARNP/Resident and agree with above documentation.  I have discussed and formulated the above plan as documented. Edits to the note have been made as needed.  Donnetta Simpers, MD [facility] [phone]   If 7pm to 7am, please call on call as listed on AMION.

## 2022-05-19 ENCOUNTER — Encounter (HOSPITAL_COMMUNITY): Payer: Self-pay | Admitting: Internal Medicine

## 2022-05-19 ENCOUNTER — Observation Stay (HOSPITAL_BASED_OUTPATIENT_CLINIC_OR_DEPARTMENT_OTHER): Payer: Medicare Other

## 2022-05-19 ENCOUNTER — Other Ambulatory Visit: Payer: Self-pay

## 2022-05-19 ENCOUNTER — Other Ambulatory Visit (HOSPITAL_COMMUNITY): Payer: Self-pay

## 2022-05-19 ENCOUNTER — Observation Stay (HOSPITAL_COMMUNITY): Payer: Medicare Other

## 2022-05-19 DIAGNOSIS — R259 Unspecified abnormal involuntary movements: Secondary | ICD-10-CM

## 2022-05-19 DIAGNOSIS — F03918 Unspecified dementia, unspecified severity, with other behavioral disturbance: Secondary | ICD-10-CM

## 2022-05-19 DIAGNOSIS — G9340 Encephalopathy, unspecified: Secondary | ICD-10-CM

## 2022-05-19 DIAGNOSIS — I639 Cerebral infarction, unspecified: Secondary | ICD-10-CM | POA: Diagnosis not present

## 2022-05-19 LAB — LIPID PANEL
Cholesterol: 207 mg/dL — ABNORMAL HIGH (ref 0–200)
HDL: 63 mg/dL
LDL Cholesterol: 127 mg/dL — ABNORMAL HIGH (ref 0–99)
Total CHOL/HDL Ratio: 3.3 ratio
Triglycerides: 87 mg/dL
VLDL: 17 mg/dL (ref 0–40)

## 2022-05-19 LAB — HEMOGLOBIN A1C
Hgb A1c MFr Bld: 6.5 % — ABNORMAL HIGH (ref 4.8–5.6)
Mean Plasma Glucose: 139.85 mg/dL

## 2022-05-19 LAB — VITAMIN B12: Vitamin B-12: 2064 pg/mL — ABNORMAL HIGH (ref 180–914)

## 2022-05-19 LAB — URINALYSIS, ROUTINE W REFLEX MICROSCOPIC
Bacteria, UA: NONE SEEN
Bilirubin Urine: NEGATIVE
Glucose, UA: NEGATIVE mg/dL
Hgb urine dipstick: NEGATIVE
Ketones, ur: NEGATIVE mg/dL
Leukocytes,Ua: NEGATIVE
Nitrite: NEGATIVE
Protein, ur: 30 mg/dL — AB
Specific Gravity, Urine: 1.024 (ref 1.005–1.030)
pH: 5 (ref 5.0–8.0)

## 2022-05-19 LAB — BASIC METABOLIC PANEL WITH GFR
Anion gap: 8 (ref 5–15)
BUN: 14 mg/dL (ref 8–23)
CO2: 21 mmol/L — ABNORMAL LOW (ref 22–32)
Calcium: 8.7 mg/dL — ABNORMAL LOW (ref 8.9–10.3)
Chloride: 111 mmol/L (ref 98–111)
Creatinine, Ser: 0.98 mg/dL (ref 0.44–1.00)
GFR, Estimated: >60 mL/min
Glucose, Bld: 109 mg/dL — ABNORMAL HIGH (ref 70–99)
Potassium: 3.6 mmol/L (ref 3.5–5.1)
Sodium: 140 mmol/L (ref 135–145)

## 2022-05-19 LAB — RAPID URINE DRUG SCREEN, HOSP PERFORMED
Amphetamines: NOT DETECTED
Barbiturates: NOT DETECTED
Benzodiazepines: NOT DETECTED
Cocaine: NOT DETECTED
Opiates: NOT DETECTED
Tetrahydrocannabinol: NOT DETECTED

## 2022-05-19 LAB — CBC
HCT: 39.4 % (ref 36.0–46.0)
Hemoglobin: 13 g/dL (ref 12.0–15.0)
MCH: 29 pg (ref 26.0–34.0)
MCHC: 33 g/dL (ref 30.0–36.0)
MCV: 87.8 fL (ref 80.0–100.0)
Platelets: 278 K/uL (ref 150–400)
RBC: 4.49 MIL/uL (ref 3.87–5.11)
RDW: 14.8 % (ref 11.5–15.5)
WBC: 10.7 K/uL — ABNORMAL HIGH (ref 4.0–10.5)
nRBC: 0 % (ref 0.0–0.2)

## 2022-05-19 LAB — TSH: TSH: 1.562 u[IU]/mL (ref 0.350–4.500)

## 2022-05-19 LAB — CK: Total CK: 371 U/L — ABNORMAL HIGH (ref 38–234)

## 2022-05-19 MED ORDER — ATORVASTATIN CALCIUM 40 MG PO TABS
40.0000 mg | ORAL_TABLET | Freq: Every day | ORAL | 0 refills | Status: DC
  Filled 2022-05-19: qty 30, 30d supply, fill #0

## 2022-05-19 NOTE — Plan of Care (Signed)
Pt will be discharged tomorrow. Problem: Education: Goal: Knowledge of General Education information will improve Description: Including pain rating scale, medication(s)/side effects and non-pharmacologic comfort measures Outcome: Progressing   Problem: Health Behavior/Discharge Planning: Goal: Ability to manage health-related needs will improve Outcome: Progressing   Problem: Clinical Measurements: Goal: Ability to maintain clinical measurements within normal limits will improve Outcome: Progressing Goal: Will remain free from infection Outcome: Progressing Goal: Diagnostic test results will improve Outcome: Progressing Goal: Respiratory complications will improve Outcome: Progressing Goal: Cardiovascular complication will be avoided Outcome: Progressing   Problem: Activity: Goal: Risk for activity intolerance will decrease Outcome: Progressing   Problem: Nutrition: Goal: Adequate nutrition will be maintained Outcome: Progressing   Problem: Coping: Goal: Level of anxiety will decrease Outcome: Progressing   Problem: Elimination: Goal: Will not experience complications related to bowel motility Outcome: Progressing Goal: Will not experience complications related to urinary retention Outcome: Progressing   Problem: Pain Managment: Goal: General experience of comfort will improve Outcome: Progressing   Problem: Safety: Goal: Ability to remain free from injury will improve Outcome: Progressing   Problem: Skin Integrity: Goal: Risk for impaired skin integrity will decrease Outcome: Progressing   Problem: Education: Goal: Knowledge of disease or condition will improve Outcome: Progressing Goal: Knowledge of secondary prevention will improve (SELECT ALL) Outcome: Progressing Goal: Knowledge of patient specific risk factors will improve (INDIVIDUALIZE FOR PATIENT) Outcome: Progressing Goal: Individualized Educational Video(s) Outcome: Progressing   Problem:  Ischemic Stroke/TIA Tissue Perfusion: Goal: Complications of ischemic stroke/TIA will be minimized Outcome: Progressing

## 2022-05-19 NOTE — ED Notes (Signed)
Admit provider at bedside 

## 2022-05-19 NOTE — Discharge Instructions (Signed)
Monica Bauer, Monica Bauer were admitted to the hospital for altered mental status that is likely due to underlying dementia. While here, a scan of your brain showed a new stroke. To help prevent future strokes, it will be very important to take daily baby aspirin and a cholesterol medication called Atorvastatin.This has been sent to the pharmacy for you. Additionally, it will be important to follow up with your doctor to continue evaluating the underlying cause of dementia.   It was a pleasure meeting you and we wish you the best!

## 2022-05-19 NOTE — Care Management Obs Status (Signed)
[place] NOTIFICATION   Patient Details  Name: Monica Bauer MRN: [id number] Date of Birth: 10/26/1956   Medicare Observation Status Notification Given:  Yes    Pollie Friar, RN 05/19/2022, 4:35 PM

## 2022-05-19 NOTE — ED Notes (Signed)
Found pt out of bed ambulating in room wondering around. Pt was redirected back into bed. Sheets changed. Draw sheet and chuck placed. [place] bed alarm placed. Pt educated on not getting out of bed and to use the call bell. Pt was placed back on monitor and VS were obtained and documented

## 2022-05-19 NOTE — Progress Notes (Signed)
HD#0 SUBJECTIVE:  Patient Summary: Monica Bauer is a 66 y.o. with a pertinent PMH of breast cancer, T2DM, and HTN, who presented with acute encephalopathy and admitted for acute CVA work-up and acute encephalopathy.   Overnight Events: Patient required redirection due to getting out of bed and wandering her room.  Interim History: Monica Bauer seems more with it this morning but does not recall what prompted her to come to the hospital. She does not recall the episodes of strange behavior. She does seem sleepy, however we woke her up.  OBJECTIVE:  Vital Signs: Vitals:   05/19/22 1420 05/19/22 1421 05/19/22 1430 05/19/22 1600  BP: 131/73  (!) 158/78 (!) 159/90  Pulse: 90  79 75  Resp: (!) '22  16 16  '$ Temp:  98 F (36.7 C) 99.1 F (37.3 C) 99 F (37.2 C)  TempSrc:  Oral Oral Oral  SpO2: 96%  98% 98%  Weight:  90.7 kg    Height:  '5\' 2"'$  (1.575 m)     Supplemental O2: Room Air SpO2: 98 %  Filed Weights   05/19/22 1421  Weight: 90.7 kg    No intake or output data in the 24 hours ending 05/19/22 1941 Net IO Since Admission: 1,000 mL [05/19/22 1941]  Physical Exam: Constitutional: Appears older than stated age. No acute distress. Cardio:Regular rate and rhythm. No murmurs, rubs, or gallops. Pulm:Clear to anterior auscultation bilaterally. Abdomen:Soft, nontender, nondistended. OYD:XAJOINOM for extremity edema. Skin:Warm and dry. Neuro: Mental Status: Patient wakes easily, is oriented x3 No signs of aphasia or neglect Cranial Nerves: II: Pupils equal, round, and reactive to light.   III,IV, VI: EOMI intact. V: Facial sensation is symmetric to light touch and temperature. VII: Facial movement is symmetric.  VIII: Hearing is intact to voice XI: Shoulder shrug is symmetric. XII: Tongue is midline without atrophy or fasciculations.  Motor: Normal effort thorughout, at least 4/5 bilateral UE, 4/5 bilateral LE Sensory: Sensation is grossly intact bilateral UE &  LE Cerebellar:Finger to nose, heel-shin intact bilaterally. Psych:Pleasantly confused.  Patient Lines/Drains/Airways Status     Active Line/Drains/Airways     Name Placement date Placement time Site Days   Peripheral IV 05/18/22 22 G Anterior;Left Hand 05/18/22  1056  Hand  1             ASSESSMENT/PLAN:  Assessment: Principal Problem:   Acute CVA (cerebrovascular accident) ([place]) Active Problems:   Acute encephalopathy   Hypertension   Hyperglycemia   Plan: #Acute R frontal parietal CVA Work up thus far has revealed HbA1c of 6.5%; elevated cholesterol at 207, elevated LDL at 127; echocardiogram showing LV EF 55-60% with no wall motion abnormalities; MRA head and neck which was limited by artifact but showed no significant intracranial or extracranial stenosis or large vessel occlusion; US carotid which showed near-normal extracranial bilateral carotid arteries with minimal wall thickening or plaque and antegrade flow of bilateral vertebral arteries with normal flow in bilateral subclavian arteries. -PT/OT recommending home health PT/OT -Start atorvastatin 40 mg daily -ASA 81 mg daily indefinitely  #Acute encephalopathy Patient was sleepy this morning but appropriately responsive. She states that she has no recollection of what brought her into the hospital including her unusual behaviors. She does not sleep much as she is the primary caregiver of her elderly mother and states that most nights she does not sleep at all. She does try to nap on her mom's dialysis days while she undergoes treatment. She has tried melatonin for sleep in the  past with some success but not much. Further work-up overnight revealed normal TSH 1.562, vitamin B12 elevated at 2064, negative UDS, UA not concerning for infection, mildly elevated CK at 371. Neurology again does to feel her presentation is related to her CVA but ordered an EEG which did not ow epileptiform activity. Psychiatry was consulted due  to concerns of a psychiatric component complicating her baseline dementia and felt her presentation is consistent with dementia with behavioral disturbances, not likely to be a psychotic disorder. In addition she fortunately does not exhibit motor symptoms decreasing suspicion for Lewy body dementia. During their discussion the patient declined medication. Her family does continue to express concerns that the behavioral disturbances peak at night when she is alone and worry that she needs assistance of her own. Unfortunately I suspect that she is having progression of her dementia as evidenced by her altered behavior and that these declines will continue to occur with time. -RPR, homocysteine levels pending -Will place order for home health aide at discharge; I have confirmed that an aide will be able to visit the home Saturday to make sure she is settling in okay -Family has arranged geriatrician appointment for Monday -Recommend continued conversations regarding home situation, specifically regarding the patient being solely responsible for care of her mother in her state  #Hypertension BP has been trending towards a more normal range since admission. At this time allow for permissive hypertension given acute CVA, <220/120, with expectation that this normalizes over the next few days.  #Hyperlipidemia LDL 127. -Start atorvastatin 40 mg daily  #Type 2 diabetes mellitus HbA1c 6.5%. -Lifestyle modifications; I will recommend on discharge ongoing conversations about medical management as patient seems and is reported to be by family very reluctant to taking medications  Best Practice: Diet: Cardiac diet IVF: None VTE: enoxaparin (LOVENOX) injection 40 mg Start: 05/18/22 1445 Code: Full AB: None Therapy Recs: Home Health Family Contact: Stephanie, called and notified. DISPO: Anticipated discharge tomorrow to Home pending Medical stability.  Signature: Farrel Gordon, D.O.  Internal Medicine  Resident, PGY-1 [facility]  Pager: [phone] 7:41 PM, 05/19/2022   Please contact the on call pager after 5 pm and on weekends at [phone].

## 2022-05-19 NOTE — Progress Notes (Signed)
PT Cancellation Note  Patient Details Name: Monica Bauer MRN: [id number] DOB: [birth year]   Cancelled Treatment:    Reason Eval/Treat Not Completed: Active bedrest order Will follow up as activity orders updated.   Lou Miner, DPT  Acute Rehabilitation Services  Office: [phone]    Rudean Hitt 05/19/2022, 12:10 PM

## 2022-05-19 NOTE — Consult Note (Signed)
[name]   Service Date: May 19, 2022 LOS:  LOS: 0 days    Assessment  Monica Bauer is a 66 y.o. female admitted medically on 05/18/2022 10:10 AM for abnormal movements and acute encephalopathy. She carries the psychiatric diagnoses of mild cognitive impairment and an episode of TGA in 2015 and has a past medical history of breast cancer, T2DM, and HTN. Psychiatry was consulted for "dementia with new behavioral disturbance" by Dr. Farrel Gordon.    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  The patient's MoCA was 19, with a documented previous score of 23, 2 years ago.  The patient's symptoms of episodic abnormal movements and odd vocalizations are best explained as behavioral disturbance.  Low suspicion for a psychotic disorder given the episodic nature of her symptoms.  Also low suspicion for substance-induced psychotic disorder for the same reason and the lack of identifiable drug use.  Low suspicion for delirium given the work-up done so far.  EEG was negative.  Do not feel there is a role for a mood stabilizer or antipsychotic at this point, given lack of axis I disorder.  Did discuss an acetylcholinesterase inhibitor with the patient but she has no interest in medication.  She demonstrates very little insight into her condition.  She does have a good relationship with her sister-in-law who has arranged follow-up with a geriatrician on Monday.  No acute safety concerns were identified during the interview.   Diagnoses:  Active Hospital problems: Principal Problem:   Acute CVA (cerebrovascular accident) [facility]) Active Problems:   Acute encephalopathy   Hypertension   Hyperglycemia     Plan  ## Safety and Observation Level:   - Based on my clinical evaluation, I estimate the patient to be at low risk of self harm in the current setting - At this time, we recommend a routine level of observation. This decision is based on my review of the chart including patient's history and current presentation, interview of the patient, mental status examination, and consideration of suicide risk including evaluating suicidal ideation, plan, intent, suicidal or self-harm behaviors, risk factors, and protective factors. This judgment is based on our ability to directly address suicide risk, implement suicide prevention strategies and develop a safety plan while the patient is in the clinical setting. Please contact our team if there is a concern that risk level has changed.   ## Medications:  --patient has declined medication  ## Medical Decision Making Capacity:  Not formally assessed  ## Further Work-up:  --ideally would receive neuropsychological testing but the patient's lack of insight and financial expense make this likely prohibitive  ## Disposition:  --home  ##Legal Status VOLUNTARY  Thank you for this consult request. Recommendations have been communicated to the primary team.  We will sign off at this time.   Corky Sox, MD   NEW history  Relevant Aspects of Hospital Course:  Admitted on 05/18/2022 for abnormal movements and acute encephalopathy. Found to have a 4m acute parietal stroke, which the stroke team feels is non-contributory to her presenting symptoms. Has now returned to baseline mentation.    Patient Report:  On interview the patient exhibits a linear and logical thought process.  She denies experiencing any mental health issues or pathology.  She is able to correctly identify that "not acting  like myself" is what brought her into the hospital.  However, she forgets telling the interviewer this fact later on in the interview and appears accusatory and confrontational.  Of note, the patient is  later interviewed by the attending psychiatrist and she is unable to remember being interviewed by this provider. She is fully alert and oriented.  MoCA score is a 19, with the patient notably missing all 5 of the recall items and still doing poorly with prompted recall via multiple-choice.  The patient denies experiencing any recent problems with her mood.  She denies experiencing any auditory or visual hallucinations.  She denies any suicidal or homicidal thoughts.  The patient is amenable to this provider talking with her sister-in-law outside the room.  She identifies herself as Monica Bauer, contact number [phone].  She reports the patient has had a significant decline over the past 5 years, to the point where she has not been paying any of the bills and has exhibited paranoia regarding people in the family stealing from her.  She reports that she is set up an appointment with a geriatrician for Monday.  On discussion with the patient she reports that she is amenable to going to this appointment, but firmly denies the initiation of any medications.  ROS:  As above  Collateral information:  See above  Psychiatric History:  Information collected from chart review, patient, sister-in-law  Family psych history: denies   Social History:  Denies any legal or illegal drug use  Family History:  The patient's family history is not on file.  Medical History: Past Medical History:  Diagnosis Date   Breast cancer ([place])    Diabetes mellitus without complication ([place])    Hypertension     Surgical History: Past Surgical History:  Procedure Laterality Date   BREAST LUMPECTOMY      Medications:   Current Facility-Administered Medications:    acetaminophen (TYLENOL) tablet 650 mg, 650 mg, Oral, Q4H PRN **OR** acetaminophen (TYLENOL) 160 MG/5ML solution 650 mg, 650 mg, Per Tube, Q4H PRN **OR** acetaminophen (TYLENOL) suppository 650 mg, 650 mg, Rectal, Q4H PRN, Sanjuan Dame,  MD   aspirin tablet 325 mg, 325 mg, Oral, Daily, Isla Pence, MD, 325 mg at 05/19/22 1053   enoxaparin (LOVENOX) injection 40 mg, 40 mg, Subcutaneous, Q24H, Sanjuan Dame, MD, 40 mg at 05/19/22 1456   senna-docusate (Senokot-S) tablet 1 tablet, 1 tablet, Oral, QHS PRN, Sanjuan Dame, MD  Allergies: Allergies  Allergen Reactions   Other     Pt does not know med allergy       Objective  Vital signs:  Temp:  [98 F (36.7 C)-98.3 F (36.8 C)] 98 F (36.7 C) (06/22 1421) Pulse Rate:  [59-92] 90 (06/22 1420) Resp:  [10-27] 22 (06/22 1420) BP: (131-181)/(73-103) 131/73 (06/22 1420) SpO2:  [87 %-99 %] 96 % (06/22 1420) Weight:  [90.7 kg] 90.7 kg (06/22 1421)  Psychiatric Specialty Exam:  Presentation  General Appearance: Casual Eye Contact:Good Speech:Clear and Coherent Speech Volume:Normal Handedness:No data recorded  Mood and Affect  Mood:Euthymic Affect:Constricted  Thought Process  Thought Processes:Coherent; Linear Descriptions of Associations:Intact  Orientation:Full (Time, Place and Person)  Thought Content:Logical  History of Schizophrenia/Schizoaffective disorder:No data recorded Duration of Psychotic Symptoms:No data recorded Hallucinations:Hallucinations: None  Ideas of Reference:None  Suicidal Thoughts:Suicidal Thoughts: No  Homicidal Thoughts:Homicidal Thoughts: No   Sensorium  Memory:Immediate Poor; Recent Poor; Remote Fair Judgment:Poor Insight:Poor  Executive Functions  Concentration:Fair Attention Span:Fair Recall:Poor Fund of Knowledge:Fair Language:Fair  Psychomotor Activity  Psychomotor  Activity:Psychomotor Activity: Normal  Assets  Assets:Social Support; Housing  Sleep  Sleep:Sleep: Fair   Physical Exam: Physical Exam Constitutional:      Appearance: the patient is not toxic-appearing.  Pulmonary:     Effort: Pulmonary effort is normal.  Neurological:     General: No focal deficit present.     Mental  Status: the patient is alert and oriented to person, place, and time.   Review of Systems  Respiratory:  Negative for shortness of breath.   Cardiovascular:  Negative for chest pain.  Gastrointestinal:  Negative for abdominal pain, constipation, diarrhea, nausea and vomiting.  Neurological:  Negative for headaches.   Blood pressure 131/73, pulse 90, temperature 98 F (36.7 C), temperature source Oral, resp. rate (!) 22, height '5\' 2"'$  (1.575 m), weight 90.7 kg, SpO2 96 %. Body mass index is 36.57 kg/m.   Corky Sox, MD PGY-1

## 2022-05-19 NOTE — Evaluation (Signed)
Physical Therapy Evaluation Patient Details Name: Monica Bauer MRN: [id number] DOB: 01/10/1956 Today's Date: 05/19/2022  History of Present Illness  Pt is a 66 y/o female admitted secondary to memory loss and cognitive decline. 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 PMH includes HTN and breast cancer.  Clinical Impression  Pt admitted secondary to problem above with deficits below. Pt requiring min guard to min A for mobility tasks this session. Notable cognitive deficits as well. 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 Educated about need for assistance with caring for her mother at home; educated to discuss with MD. Recommending HHPT at d/c to address current mobility deficits. Would also benefit from aide to assist with ADL/IADL tasks at home. Will continue to follow acutely.        Recommendations for follow up therapy are one component of a multi-disciplinary discharge planning process, led by the attending physician.  Recommendations may be updated based on patient status, additional functional criteria and insurance authorization.  Follow Up Recommendations Home health PT [facility])      Assistance Recommended at Discharge Frequent or constant Supervision/Assistance  Patient can return home with the following  A lot of help with bathing/dressing/bathroom;Assistance with cooking/housework;Help with stairs or ramp for entrance;Assist for transportation;Direct supervision/assist for financial management;Direct supervision/assist for medications management    Equipment Recommendations None recommended by PT  Recommendations for Other Services       Functional Status Assessment Patient has had a recent decline in their functional status and demonstrates the ability to make significant improvements in function in a reasonable and predictable amount of time.     Precautions / Restrictions Precautions Precautions: Fall Restrictions Weight Bearing Restrictions: No      Mobility   Bed Mobility Overal bed mobility: Needs Assistance Bed Mobility: Supine to Sit, Sit to Supine     Supine to sit: Supervision Sit to supine: Supervision   General bed mobility comments: Supervision for safety and line management    Transfers Overall transfer level: Needs assistance Equipment used: None Transfers: Sit to/from Stand Sit to Stand: Min guard           General transfer comment: Min guard for safety.    Ambulation/Gait Ambulation/Gait assistance: Min guard, Min assist Gait Distance (Feet): 150 Feet Assistive device: None Gait Pattern/deviations: Step-through pattern, Decreased stride length Gait velocity: Decreased     General Gait Details: Mild unsteadiness noted with 1 LOB requiring up to minA. Otherwise requiring min guard.  Stairs            Wheelchair Mobility    Modified Rankin (Stroke Patients Only)       Balance Overall balance assessment: Needs assistance Sitting-balance support: No upper extremity supported, Feet supported Sitting balance-Leahy Scale: Good     Standing balance support: No upper extremity supported Standing balance-Leahy Scale: Fair                               Pertinent Vitals/Pain Pain Assessment Pain Assessment: No/denies pain    Home Living Family/patient expects to be discharged to:: Private residence Living Arrangements: Parent Available Help at Discharge: Family Type of Home: House Home Access: Level entry       Home Layout: Two level;Able to live on main level with bedroom/bathroom Home Equipment: Rolling Walker (2 wheels);Cane - single point Additional Comments: caretaker for her mother. Per pt she does not physically assist, however, per her sister in law, pt does  have to physically assist.    Prior Function Prior Level of Function : Independent/Modified Independent                     Hand Dominance   Dominant Hand: Right    Extremity/Trunk Assessment   Upper  Extremity Assessment Upper Extremity Assessment: Defer to OT evaluation    Lower Extremity Assessment Lower Extremity Assessment: Generalized weakness    Cervical / Trunk Assessment Cervical / Trunk Assessment: Kyphotic  Communication   Communication: No difficulties  Cognition Arousal/Alertness: Awake/alert Behavior During Therapy: WFL for tasks assessed/performed Overall Cognitive Status: Impaired/Different from baseline Area of Impairment: Safety/judgement, Problem solving, Memory                     Memory: Decreased short-term memory, Decreased recall of precautions   Safety/Judgement: Decreased awareness of deficits, Decreased awareness of safety   Problem Solving: Slow processing General Comments: Pt only recalling 2/3 words following 3 word recall. Pt reporting conflicting information during home information questions as well. Decreased awareness of deficits.        General Comments General comments (skin integrity, edema, etc.): Discussed needing increased assist to care for her mother at home.    Exercises     Assessment/Plan    PT Assessment Patient needs continued PT services  PT Problem List Decreased strength;Decreased activity tolerance;Decreased balance;Decreased cognition;Decreased safety awareness;Decreased knowledge of precautions;Decreased mobility       PT Treatment Interventions Gait training;DME instruction;Stair training;Functional mobility training;Therapeutic activities;Therapeutic exercise;Balance training;Patient/family education    PT Goals (Current goals can be found in the Care Plan section)  Acute Rehab PT Goals Patient Stated Goal: to go home PT Goal Formulation: With patient Time For Goal Achievement: 06/02/22 Potential to Achieve Goals: Good    Frequency Min 3X/week     Co-evaluation               AM-PAC PT "6 Clicks" Mobility  Outcome Measure Help needed turning from your back to your side while in a flat bed  without using bedrails?: None Help needed moving from lying on your back to sitting on the side of a flat bed without using bedrails?: A Little Help needed moving to and from a bed to a chair (including a wheelchair)?: A Little Help needed standing up from a chair using your arms (e.g., wheelchair or bedside chair)?: A Little Help needed to walk in hospital room?: A Little Help needed climbing 3-5 steps with a railing? : A Little 6 Click Score: 19    End of Session Equipment Utilized During Treatment: Gait belt Activity Tolerance: Patient tolerated treatment well Patient left: in bed;with call bell/phone within reach;with family/visitor present (on stretcher in ED) Nurse Communication: Mobility status PT Visit Diagnosis: Unsteadiness on feet (R26.81);Muscle weakness (generalized) (M62.81);Difficulty in walking, not elsewhere classified (R26.2)    Time: 1251-1315 PT Time Calculation (min) (ACUTE ONLY): 24 min   Charges:   PT Evaluation $PT Eval Moderate Complexity: 1 Mod PT Treatments $Gait Training: 8-22 mins        Lou Miner, DPT  Acute Rehabilitation Services  Office: [phone]   Rudean Hitt 05/19/2022, 2:05 PM

## 2022-05-19 NOTE — Hospital Course (Addendum)
Acute R frontal parietal CVA Patient underwent MRI on initial presentation and  was found to have acute 5 mm R frontal parietal CVA on MRI. Neurology was consulted and recommended stroke risk-factor work-up, including HbA1c which was elevated at 6.5%; lipid panel which showed elevated cholesterol at 207 and LDL of 127; echocardiogram which showed LF EV 55-60% with no wall motion abnormalities; carotid ultrasound which showed near-normal extracranial bilateral carotid arteries with minimal wall thickening or plaque and antegrade flow of bilateral vertebral arteries with normal flow in bilateral subclavian arteries; MRA head and neck which was limited by motion but showed no significant intracranial or extracranial stenosis or large vessel occlusion. She was started on ASA 325 mg at admission followed by high-intensity statin prior to discharge. PT recommending home health PT; OT recommendations actively pending.   Acute encephalopathy Patient presented with acute behavior disturbance complicated by pre-existing dementia. 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 LFTs were within normal limits decreasing concern for hepatic encephalopathy, BUN was normal decreasing concern for uremia; no witnessed shaking, tongue biting, or bowel/bladder incontinence decreaseing suspicion for seizure as well. Several labs were obtained including TSH which was normal at 1.562, vitamin B12 which was elevated at 2064; UDS which was negative; UA which was not concerning for infection. CK was elevated at 371, which is improved from prior values over the last 2 years. Neurology was consulted and did not feel that her encephalopathy was related to acute CVA, but did order EEG which did not show epileptiform activity.   Homocysteine, rpr Given the significant amount of stress that she is under confounded by baseline dementia and new  frontotemporal stroke, I am concerned for a psychiatric cause such as brief psychosis. -Will check TSH, vitamin B12, CK -UA, blood culture, urine culture to rule out infection -F/u UDS -Consider psychiatry consult if no other cause found in work-up above -Will need outpatient cognitive work-up, which has previously been recommended as well   Hypertension History of untreated hypertension. She has had intermittent hypertensive episodes with BP max 182/115; BP now trending towards normal, most recently 159/75. -CTM   Hx type 2 diabetes mellitus I am unable to see an HbA1c more recently than 2021, at that time it was 6.1%. She is not on medication for diabetes as an outpatient. Glucose of 149 at time of admission. -CT

## 2022-05-19 NOTE — Evaluation (Signed)
Occupational Therapy Evaluation Patient Details Name: Monica Bauer MRN: [id number] DOB: 1956-06-26 Today's Date: 05/19/2022   History of Present Illness Pt is a 66 y/o female admitted secondary to memory loss and cognitive decline. 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 PMH includes HTN and breast cancer.   Clinical Impression   Pt admitted for concerns listed above. PTA pt reported that she was independent with all ADL's and IADL's, including caring for her mother. At this time, pt presents near baseline physically, however cognitively pt demonstrates some concerns. Her short term memory, sequencing, and safety awareness appear to be impacted. During the session she completed the Short Blessed Test, in which she scored 11 out of 70- which indicates cognitive concerns with the need to further evaluate for cognitive decline, such as evaluating for dementia. Recommending pt receive further assist, such as family or aides to assist with her mom, as well as HHOT and HHAide to assist her at home. OT will continue to follow acutely.      Recommendations for follow up therapy are one component of a multi-disciplinary discharge planning process, led by the attending physician.  Recommendations may be updated based on patient status, additional functional criteria and insurance authorization.   Follow Up Recommendations  Home health OT    Assistance Recommended at Discharge Intermittent Supervision/Assistance  Patient can return home with the following A little help with bathing/dressing/bathroom;Assistance with cooking/housework;A little help with walking and/or transfers;Direct supervision/assist for medications management    Functional Status Assessment  Patient has had a recent decline in their functional status and demonstrates the ability to make significant improvements in function in a reasonable and predictable amount of time.  Equipment Recommendations  None recommended by OT     Recommendations for Other Services       Precautions / Restrictions Precautions Precautions: Fall Restrictions Weight Bearing Restrictions: No      Mobility Bed Mobility Overal bed mobility: Needs Assistance Bed Mobility: Supine to Sit, Sit to Supine     Supine to sit: Supervision Sit to supine: Supervision   General bed mobility comments: Supervision for safety and line management    Transfers Overall transfer level: Needs assistance Equipment used: None Transfers: Sit to/from Stand Sit to Stand: Min guard           General transfer comment: Min guard for safety.      Balance Overall balance assessment: Needs assistance Sitting-balance support: No upper extremity supported, Feet supported Sitting balance-Leahy Scale: Good     Standing balance support: No upper extremity supported Standing balance-Leahy Scale: Fair                             ADL either performed or assessed with clinical judgement   ADL Overall ADL's : Needs assistance/impaired Eating/Feeding: Set up;Sitting   Grooming: Supervision/safety;Standing   Upper Body Bathing: Supervision/ safety;Sitting   Lower Body Bathing: Min guard;Sitting/lateral leans;Sit to/from stand   Upper Body Dressing : Supervision/safety;Sitting   Lower Body Dressing: Min guard;Sitting/lateral leans;Sit to/from stand   Toilet Transfer: Min guard;Ambulation   Toileting- Clothing Manipulation and Hygiene: Min guard;Sitting/lateral lean       Functional mobility during ADLs: Min guard General ADL Comments: sup to min guard for safety     Vision Baseline Vision/History: 1 Wears glasses Ability to See in Adequate Light: 1 Impaired Patient Visual Report: No change from baseline Vision Assessment?: No apparent visual deficits Additional Comments: impaired this session due  to not having her galsses     Perception     Praxis      Pertinent Vitals/Pain Pain Assessment Pain Assessment:  No/denies pain     Hand Dominance Right   Extremity/Trunk Assessment Upper Extremity Assessment Upper Extremity Assessment: Overall WFL for tasks assessed   Lower Extremity Assessment Lower Extremity Assessment: Defer to PT evaluation   Cervical / Trunk Assessment Cervical / Trunk Assessment: Kyphotic   Communication Communication Communication: No difficulties   Cognition Arousal/Alertness: Awake/alert Behavior During Therapy: WFL for tasks assessed/performed Overall Cognitive Status: Impaired/Different from baseline Area of Impairment: Safety/judgement, Problem solving, Memory                     Memory: Decreased short-term memory, Decreased recall of precautions   Safety/Judgement: Decreased awareness of deficits, Decreased awareness of safety   Problem Solving: Slow processing General Comments: Pt completed Short Blessed Test - scoring 11 out of 28.     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 Per pt she does not physically assist, however, per her sister in law, pt does have to physically assist.      Prior Functioning/Environment Prior Level of Function : Independent/Modified Independent                        OT Problem List: Decreased activity tolerance;Decreased cognition;Decreased safety awareness      OT Treatment/Interventions: Self-care/ADL training;Therapeutic activities;Cognitive remediation/compensation;Patient/family  education;Balance training    OT Goals(Current goals can be found in the care plan section) Acute Rehab OT Goals Patient Stated Goal: To go home OT Goal Formulation: With patient Time For Goal Achievement: 06/02/22 Potential to Achieve Goals: Good ADL Goals Additional ADL Goal #1: Pt will complete a medication management task with no errors independently. Additional ADL Goal #2: Pt will complete a 3-4 step pathfinding task with independence.  OT Frequency: Min 2X/week    Co-evaluation              AM-PAC OT "6 Clicks" Daily Activity     Outcome Measure Help from another person eating meals?: A Little Help from another person taking care of personal grooming?: A Little Help from another person toileting, which includes using toliet, bedpan, or urinal?: A Little Help from another person bathing (including washing, rinsing, drying)?: A Little Help from another person to put on and taking off regular upper body clothing?: A Little Help from another person to put on and taking off regular lower body clothing?: A Little 6 Click Score: 18   End of Session Equipment Utilized During Treatment: Gait belt  Activity Tolerance: Patient tolerated treatment well Patient left: in bed;with bed alarm set;with call bell/phone within reach;with family/visitor present  OT Visit Diagnosis: Unsteadiness on feet (R26.81);Other abnormalities of gait and mobility (R26.89);Muscle weakness (generalized) (M62.81)                Time: 4696-2952 OT Time Calculation (min): 27 min  Charges:  OT General Charges $OT Visit: 1 Visit OT Evaluation $OT Eval Moderate Complexity: 1 Mod OT Treatments $Cognitive Funtion inital: Initial 15 mins  Trevell Pariseau H., OTR/L Acute Rehabilitation  Vivyan Biggers Elane Delvon Chipps 05/19/2022, 6:04 PM

## 2022-05-19 NOTE — Progress Notes (Signed)
VASCULAR LAB    Carotid duplex has been performed.  See CV proc for preliminary results.   Klaryssa Fauth, RVT 05/19/2022, 12:09 PM

## 2022-05-19 NOTE — Procedures (Signed)
Patient Name: Monica Bauer  MRN: [id number]  Epilepsy Attending: Lora Havens  Referring Physician/Provider: Hetty Blend, NP  Date:05/19/2022 Duration: 21.31 mins  Patient history: 66 year old female with a PMH of memory loss, HTN, sleep apnea, diabetes, and breast cancer presenting with abnormal movements and speech and confusion.EEG to evaluate for seizure.  Level of alertness: Awake, asleep  AEDs during EEG study: None  Technical aspects: This EEG study was done with scalp electrodes positioned according to the 10-20 International system of electrode placement. Electrical activity was acquired at a sampling rate of '500Hz'$  and reviewed with a high frequency filter of '70Hz'$  and a low frequency filter of '1Hz'$ . EEG data were recorded continuously and digitally stored.   Description: The posterior dominant rhythm consists of 9-10 Hz activity of moderate voltage (25-35 uV) seen predominantly in posterior head regions, symmetric and reactive to eye opening and eye closing. Sleep was characterized by vertex waves, sleep spindles (12 to 14 Hz), maximal frontocentral region.  Hyperventilation and photic stimulation were not performed.     IMPRESSION: This study is within normal limits. No seizures or epileptiform discharges were seen throughout the recording.  Kaytelynn Scripter [name]

## 2022-05-20 ENCOUNTER — Other Ambulatory Visit (HOSPITAL_COMMUNITY): Payer: Self-pay

## 2022-05-20 DIAGNOSIS — I639 Cerebral infarction, unspecified: Secondary | ICD-10-CM | POA: Diagnosis not present

## 2022-05-20 LAB — URINE CULTURE

## 2022-05-20 LAB — BASIC METABOLIC PANEL
Anion gap: 8 (ref 5–15)
BUN: 15 mg/dL (ref 8–23)
CO2: 23 mmol/L (ref 22–32)
Calcium: 9.2 mg/dL (ref 8.9–10.3)
Chloride: 110 mmol/L (ref 98–111)
Creatinine, Ser: 0.92 mg/dL (ref 0.44–1.00)
GFR, Estimated: >60 mL/min (ref >60)
Glucose, Bld: 99 mg/dL (ref 70–99)
Potassium: 3.6 mmol/L (ref 3.5–5.1)
Sodium: 141 mmol/L (ref 135–145)

## 2022-05-20 LAB — CBC
HCT: 38.2 % (ref 36.0–46.0)
Hemoglobin: 12.5 g/dL (ref 12.0–15.0)
MCH: 28.3 pg (ref 26.0–34.0)
MCHC: 32.7 g/dL (ref 30.0–36.0)
MCV: 86.4 fL (ref 80.0–100.0)
Platelets: 258 10*3/uL (ref 150–400)
RBC: 4.42 MIL/uL (ref 3.87–5.11)
RDW: 14.5 % (ref 11.5–15.5)
WBC: 9.9 10*3/uL (ref 4.0–10.5)
nRBC: 0 % (ref 0.0–0.2)

## 2022-05-20 LAB — RPR: RPR Ser Ql: NONREACTIVE

## 2022-05-20 LAB — HIV ANTIBODY (ROUTINE TESTING W REFLEX): HIV Screen 4th Generation wRfx: NONREACTIVE

## 2022-05-20 LAB — HOMOCYSTEINE: Homocysteine: 17.5 umol/L — ABNORMAL HIGH (ref 0.0–17.2)

## 2022-05-20 MED ORDER — ASPIRIN 81 MG PO TBEC
81.0000 mg | DELAYED_RELEASE_TABLET | Freq: Every day | ORAL | 12 refills | Status: DC
  Filled 2022-05-20: qty 30, 30d supply, fill #0

## 2022-05-20 MED ORDER — ASPIRIN 81 MG PO TBEC
81.0000 mg | DELAYED_RELEASE_TABLET | Freq: Every day | ORAL | Status: DC
  Administered 2022-05-20: 81 mg via ORAL
  Filled 2022-05-20: qty 1

## 2022-05-20 NOTE — TOC Transition Note (Addendum)
Transition of Care [facility]) - CM/SW Discharge Note   Patient Details  Name: Monica Bauer MRN: [id number] Date of Birth: [birth year]  [facility] CM/SW Contact:  Kermit Balo, RN Phone Number: 05/20/2022, 10:51 AM   Clinical Narrative:    CM met with the patient yesterday with her sister in law at the bedside. Pt lives at home with her mother who is practically bedbound. 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 CM inquired about family or caregivers that may be able to assist. Pt gave permission for CM to call her son. CM left son, Gerlene Burdock a VM yesterday.  Pt states she has DME at home from her mother that she can use.  Orders for [facility] services. Pt had no preference on [facility] agency. CM has arranged HH with [facility]. Information on the AVS.  Pt without a PCP. Sister in law has her an appointment on Monday with new PCP. [facility] will cover her for [facility] until she sees her PCP on Monday.  Pt was not taking any medications prior to admission. HH and family to make sure she is taking her medications appropriately.  Pt doesn't drive. She uses cabs for transportation.  CM spoke to Gerlene Burdock (son) this am and he has arranged for caregivers to assist at the patients home. Patient will thus have assistance and so will her mother.  Pts sister in law to provide transport home today.  Home address: [address] [place]  Final next level of care: Home w Home Health Services Barriers to Discharge: No Barriers Identified   Patient Goals and CMS Choice   CMS Medicare.gov Compare Post Acute Care list provided to:: Patient Choice offered to / list presented to : Patient  Discharge Placement                       Discharge Plan and Services                          HH Arranged: RN, PT, OT, Social Work [facility] Agency: [facility] Date [facility] Agency Contacted: 05/19/22   Representative spoke with at [facility] Agency: Eber Jones  Social Determinants of Health (SDOH)  Interventions     Readmission Risk Interventions     No data to display

## 2022-05-23 LAB — BASIC METABOLIC PANEL
BUN: 15 (ref 4–21)
CO2: 25 — AB (ref 13–22)
Chloride: 106 (ref 99–108)
Creatinine: 0.9 (ref <=1.1)
Glucose: 111
Potassium: 4.1 mEq/L (ref 3.5–5.1)
Sodium: 141 (ref 137–147)

## 2022-05-23 LAB — PROTEIN / CREATININE RATIO, URINE
Albumin, U: 23.8
Creatinine, Urine: 269

## 2022-05-23 LAB — VITAMIN D 25 HYDROXY (VIT D DEFICIENCY, FRACTURES): Vit D, 25-Hydroxy: 30

## 2022-05-23 LAB — HEPATIC FUNCTION PANEL
ALT: 22 U/L (ref 7–35)
AST: 20 (ref 13–35)
Alkaline Phosphatase: 60 (ref 25–125)
Bilirubin, Total: 0.5

## 2022-05-23 LAB — CBC AND DIFFERENTIAL
HCT: 39 (ref 36–46)
Hemoglobin: 13 (ref 12.0–16.0)
Platelets: 287 10*3/uL (ref 150–400)
WBC: 10.5

## 2022-05-23 LAB — CBC: RBC: 4.48 (ref 3.87–5.11)

## 2022-05-23 LAB — COMPREHENSIVE METABOLIC PANEL
Albumin: 4.1 (ref 3.5–5.0)
Calcium: 9.5 (ref 8.7–10.7)
Globulin: 3.1
eGFR: 73

## 2022-05-23 LAB — CULTURE, BLOOD (ROUTINE X 2)
Culture: NO GROWTH
Culture: NO GROWTH

## 2022-05-23 LAB — TSH: TSH: 0.74 (ref <=5.90)

## 2022-05-27 LAB — LIPID PANEL
Cholesterol: 186 (ref 0–200)
HDL: 64 (ref 35–70)
LDL Cholesterol: 104
Triglycerides: 85 (ref 40–160)

## 2022-05-27 LAB — HM HEPATITIS C SCREENING LAB: HM Hepatitis Screen: NEGATIVE

## 2022-06-06 ENCOUNTER — Encounter: Payer: Self-pay | Admitting: Family Medicine

## 2022-06-06 ENCOUNTER — Ambulatory Visit (INDEPENDENT_AMBULATORY_CARE_PROVIDER_SITE_OTHER): Payer: Medicare Other | Admitting: Family Medicine

## 2022-06-06 VITALS — BP 124/67 | HR 79 | Ht 62.0 in | Wt 204.8 lb

## 2022-06-06 DIAGNOSIS — Z7689 Persons encountering health services in other specified circumstances: Secondary | ICD-10-CM | POA: Diagnosis not present

## 2022-06-06 DIAGNOSIS — Z1211 Encounter for screening for malignant neoplasm of colon: Secondary | ICD-10-CM | POA: Diagnosis not present

## 2022-06-06 NOTE — Progress Notes (Signed)
New Patient Office Visit  Subjective    Patient ID: Monica Bauer, female    DOB: [birth year]  Age: 66 y.o. MRN: [id number]  CC: establish care   HPI Monica Bauer presents to establish care. She has not seen a PCP in over a year.   She denies any chronic conditions or acute complaints.   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  - States she just a full panel of blood work and will bring [place] a copy of results    Health Maintenance: Mammogram (Hx of right breast cancer 2014?)- follows with oncology, normal (Dr. Baird Cancer) Colonoscopy - referral placed  Bone density - deferred     Outpatient Encounter Medications as of 06/06/2022  Medication Sig   aspirin EC 81 MG tablet Take 1 tablet (81 mg total) by mouth daily. Swallow whole.   atorvastatin (LIPITOR) 40 MG tablet Take 1 tablet (40 mg total) by mouth daily.   No facility-administered encounter medications on file as of 06/06/2022.    Past Medical History:  Diagnosis Date   Breast cancer ([place])    Diabetes mellitus without complication ([place])    Hyperlipidemia    Hypertension    Stroke [facility])     Past Surgical History:  Procedure Laterality Date   BREAST LUMPECTOMY      No family history on file.  Social History   Socioeconomic History   Marital status: Single    Spouse name: Not on file   Number of children: Not on file   Years of education: Not on file   Highest education level: Not on file  Occupational History   Not on file  Tobacco Use   Smoking status: Former   Smokeless tobacco: Never  Substance and Sexual Activity   Alcohol use: No   Drug use: Never   Sexual activity: Not on file  Other Topics  Concern   Not on file  Social History Narrative   Not on file   Social Determinants of Health   Financial Resource Strain: Not on file  Food Insecurity: Not on file  Transportation Needs: Not on file  Physical Activity: Not on file  Stress: Not on file  Social Connections: Not on file  Intimate Partner Violence: Not on file    ROS All review of systems negative except what is listed in the HPI      Objective    BP 124/67   Pulse 79   Ht '5\' 2"'$  (1.575 m)   Wt 204 lb 12.8 oz (92.9 kg)   BMI 37.46 kg/m   Physical Exam Vitals reviewed.  Constitutional:      Appearance: Normal appearance.  Cardiovascular:     Rate and Rhythm: Normal rate and regular rhythm.  Pulmonary:     Effort: Pulmonary effort is normal.     Breath sounds: Normal breath sounds.  Musculoskeletal:     Cervical back: No tenderness.     Right lower leg: No edema.     Left lower leg: No edema.  Lymphadenopathy:     Cervical: No cervical adenopathy.  Skin:    General: Skin is warm  and dry.     Capillary Refill: Capillary refill takes less than 2 seconds.  Neurological:     General: No focal deficit present.     Mental Status: She is alert and oriented to person, place, and time. Mental status is at baseline.  Psychiatric:        Mood and Affect: Mood normal.        Behavior: Behavior normal.        Thought Content: Thought content normal.        Judgment: Judgment normal.         Assessment & Plan:   Problem List Items Addressed This Visit   None Visit Diagnoses     Screen for colon cancer    -  Primary   Relevant Orders   Ambulatory referral to Gastroenterology   Encounter to establish care         She will bring [place] lab results from most recent visit elsewhere. No red flags today. Will check records for health maintenance.  Discussed healthy diet and physical activity.    Return in about 6 months (around 12/07/2022) for routine f/u with labs .   Terrilyn Saver, NP

## 2022-06-06 NOTE — Patient Instructions (Signed)
Thank you for choosing [facility] for your Primary Care needs. I am excited for the opportunity to partner with you to meet your health care goals. It was a pleasure meeting you today!   Information on diet, exercise, and health maintenance recommendations are listed below. This is information to help you be sure you are on track for optimal health and monitoring.   Please look over this and let us know if you have any questions or if you have completed any of the health maintenance outside of [name] so that we can be sure your records are up to date.  ___________________________________________________________  MyChart:  For all urgent or time sensitive needs we ask that you please call the office to avoid delays. Our number is [phone]. MyChart is not constantly monitored and due to the large volume of messages a day, replies may take up to 72 business hours.  MyChart Policy: MyChart allows for you to see your visit notes, after visit summary, provider recommendations, lab and tests results, make an appointment, request refills, and contact your provider or the office for non-urgent questions or concerns. Providers are seeing patients during normal business hours and do not have built in time to review MyChart messages.  We ask that you allow a minimum of 3 business days for responses to MyChart messages. For this reason, please do not send urgent requests through MyChart. Please call the office at [phone]. New and ongoing conditions may require a visit. We have virtual and in-person visits available for your convenience.  Complex MyChart concerns may require a visit. Your provider may request you schedule a virtual or in-person visit to ensure we are providing the best care possible. MyChart messages sent after 11:00 AM on Friday will not be received by the provider until Monday morning.    Lab and Test Results: You will receive your lab and  test results on MyChart as soon as they are completed and results have been sent by the lab or testing facility. Due to this service, you will receive your results BEFORE your provider.  I review lab and test results each morning prior to seeing patients. Some results require collaboration with other providers to ensure you are receiving the most appropriate care. For this reason, we ask that you please allow a minimum of 3-5 business days from the time that ALL results have been received for your provider to receive and review lab and test results and contact you about these.  Most lab and test result comments from the provider will be sent through MyChart. Your provider may recommend changes to the plan of care, follow-up visits, repeat testing, ask questions, or request an office visit to discuss these results. You may reply directly to this message or call the office to provide information for the provider or set up an appointment. In some instances, you will be called with test results and recommendations. Please let us know if this is preferred and we will make note of this in your chart to provide this for you.    If you have not heard a response to your lab or test results in 5 business days from all results returning to MyChart, please call the office to let us know. We ask that you please avoid calling prior to this time unless there is an emergent concern. Due to high call volumes, this can delay the resulting process.  After Hours: For all non-emergency after hours needs,   please call the office at [phone] and select the option to reach the on-call  service. On-call services are shared between multiple [facility] offices and therefore it will not be possible to speak directly with your provider. On-call providers may provide medical advice and recommendations, but are unable to provide refills for maintenance medications.  For all emergency or urgent medical needs after normal business  hours, we recommend that you seek care at the closest Urgent Care or Emergency Department to ensure appropriate treatment in a timely manner.  [facility] has a 24 hour emergency room located on the ground floor for your convenience.   Urgent Concerns During the Business Day Providers are seeing patients from 8AM to 5PM with a busy schedule and are most often not able to respond to non-urgent calls until the end of the day or the next business day. If you should have URGENT concerns during the day, please call and speak to the nurse or schedule a same day appointment so that we can address your concern without delay.   Thank you, again, for choosing me as your health care partner. I appreciate your trust and look forward to learning more about you.   Monica Godar B. Monica Chipman, DNP, FNP-C  ___________________________________________________________  Health Maintenance Recommendations Screening Testing Mammogram Every 1-2 years based on history and risk factors Starting at age 50 Pap Smear Ages 21-39 every 3 years Ages 30-65 every 5 years with HPV testing More frequent testing may be required based on results and history Colon Cancer Screening Every 1-10 years based on test performed, risk factors, and history Starting at age 45 Bone Density Screening Every 2-10 years based on history Starting at age 65 for women Recommendations for men differ based on medication usage, history, and risk factors AAA Screening One time ultrasound Men 65-75 years old who have ever smoked Lung Cancer Screening Low Dose Lung CT every 12 months Age 50-80 years with a 20 pack-year smoking history who still smoke or who have quit within the last 15 years  Screening Labs Routine  Labs: Complete Blood Count (CBC), Complete Metabolic Panel (CMP), Cholesterol (Lipid Panel) Every 6-12 months based on history and medications May be recommended more frequently based on current conditions or  previous results Hemoglobin A1c Lab Every 3-12 months based on history and previous results Starting at age 45 or earlier with diagnosis of diabetes, high cholesterol, BMI >26, and/or risk factors Frequent monitoring for patients with diabetes to ensure blood sugar control Thyroid Panel (TSH w/ T3 & T4) Every 6 months based on history, symptoms, and risk factors May be repeated more often if on medication HIV One time testing for all patients 13 and older May be repeated more frequently for patients with increased risk factors or exposure Hepatitis C One time testing for all patients 18 and older May be repeated more frequently for patients with increased risk factors or exposure Gonorrhea, Chlamydia Every 12 months for all sexually active persons 13-24 years Additional monitoring may be recommended for those who are considered high risk or who have symptoms PSA Men 40-54 years old with risk factors Additional screening may be recommended from age 55-69 based on risk factors, symptoms, and history  Vaccine Recommendations Tetanus Booster All adults every 10 years Flu Vaccine All patients 6 months and older every year COVID Vaccine All patients 12 years and older Initial dosing with booster May recommend additional booster based on age and health history HPV Vaccine 2 doses all patients   age 9-26 Dosing may be considered for patients over 26 Shingles Vaccine (Shingrix) 2 doses all adults 50 years and older Pneumonia (Pneumovax 23) All adults 65 years and older May recommend earlier dosing based on health history Pneumonia (Prevnar 13) All adults 65 years and older Dosed 1 year after Pneumovax 23 Pneumonia (Prevnar 20) All adults 65 years and older (adults 19-64 with certain conditions or risk factors) 1 dose  For those who have no received Prevnar 13 vaccine previously   Additional Screening, Testing, and Vaccinations may be recommended on an individualized basis based on  family history, health history, risk factors, and/or exposure.  __________________________________________________________  Diet Recommendations for All Patients  I recommend that all patients maintain a diet low in saturated fats, carbohydrates, and cholesterol. While this can be challenging at first, it is not impossible and small changes can make big differences.  Things to try: Decreasing the amount of soda, sweet tea, and/or juice to one or less per day and replace with water While water is always the first choice, if you do not like water you may consider adding a water additive without sugar to improve the taste other sugar free drinks Replace potatoes with a brightly colored vegetable at dinner Use healthy oils, such as canola oil or olive oil, instead of butter or hard margarine Limit your bread intake to two pieces or less a day Replace regular pasta with low carb pasta options Bake, broil, or grill foods instead of frying Monitor portion sizes  Eat smaller, more frequent meals throughout the day instead of large meals  An important thing to remember is, if you love foods that are not great for your health, you don't have to give them up completely. Instead, allow these foods to be a reward when you have done well. Allowing yourself to still have special treats every once in a while is a nice way to tell yourself thank you for working hard to keep yourself healthy.   Also remember that every day is a new day. If you have a bad day and "fall off the wagon", you can still climb right back up and keep moving along on your journey!  We have resources available to help you!  Some websites that may be helpful include: www.MyPlate.gov  Www.VeryWellFit.com _____________________________________________________________  Activity Recommendations for All Patients  I recommend that all adults get at least 20 minutes of moderate physical activity that elevates your heart rate at least 5  days out of the week.  Some examples include: Walking or jogging at a pace that allows you to carry on a conversation Cycling (stationary bike or outdoors) Water aerobics Yoga Weight lifting Dancing If physical limitations prevent you from putting stress on your joints, exercise in a pool or seated in a chair are excellent options.  Do determine your MAXIMUM heart rate for activity: 220 - YOUR AGE = MAX Heart Rate   Remember! Do not push yourself too hard.  Start slowly and build up your pace, speed, weight, time in exercise, etc.  Allow your body to rest between exercise and get good sleep. You will need more water than normal when you are exerting yourself. Do not wait until you are thirsty to drink. Drink with a purpose of getting in at least 8, 8 ounce glasses of water a day plus more depending on how much you exercise and sweat.    If you begin to develop dizziness, chest pain, abdominal pain, jaw pain, shortness of breath, headache,   vision changes, lightheadedness, or other concerning symptoms, stop the activity and allow your body to rest. If your symptoms are severe, seek emergency evaluation immediately. If your symptoms are concerning, but not severe, please let us know so that we can recommend further evaluation.     

## 2022-06-08 ENCOUNTER — Encounter: Payer: Self-pay | Admitting: *Deleted

## 2022-06-08 ENCOUNTER — Telehealth: Payer: Self-pay | Admitting: Family Medicine

## 2022-06-08 NOTE — Telephone Encounter (Signed)
Labs abstracted 

## 2022-06-08 NOTE — Telephone Encounter (Signed)
Pt dropped off copy of Lab results for provider to keep and have on pt's chart (6 pages [place] labs) Document put at front office tray under providers name.

## 2022-06-10 ENCOUNTER — Encounter: Payer: Self-pay | Admitting: *Deleted

## 2022-06-14 ENCOUNTER — Encounter: Payer: Self-pay | Admitting: Neurology

## 2022-06-14 ENCOUNTER — Ambulatory Visit (INDEPENDENT_AMBULATORY_CARE_PROVIDER_SITE_OTHER): Payer: Medicare Other | Admitting: Neurology

## 2022-06-14 VITALS — BP 126/62 | HR 73 | Ht 62.0 in | Wt 207.0 lb

## 2022-06-14 DIAGNOSIS — I6350 Cerebral infarction due to unspecified occlusion or stenosis of unspecified cerebral artery: Secondary | ICD-10-CM | POA: Diagnosis not present

## 2022-06-14 DIAGNOSIS — G3184 Mild cognitive impairment, so stated: Secondary | ICD-10-CM | POA: Diagnosis not present

## 2022-06-14 MED ORDER — ATORVASTATIN CALCIUM 40 MG PO TABS: 40.0000 mg | ORAL_TABLET | Freq: Every day | ORAL | 3 refills | Status: DC

## 2022-06-14 NOTE — Patient Instructions (Addendum)
Continue with Aspirin 81 mg and Amlodipine daily  Start with Lipitor 40 mg daily  Consider nursing facility for your mother as you are having difficulty taking care of her at your home  Return in 6 months    There are well-accepted and sensible ways to reduce risk for Alzheimers disease and other degenerative brain disorders .  Exercise Daily Walk A daily 20 minute walk should be part of your routine. Disease related apathy can be a significant roadblock to exercise and the only way to overcome this is to make it a daily routine and perhaps have a reward at the end (something your loved one loves to eat or drink perhaps) or a personal trainer coming to the home can also be very useful. Most importantly, the patient is much more likely to exercise if the caregiver / spouse does it with him/her. In general a structured, repetitive schedule is best.  General Health: Any diseases which effect your body will effect your brain such as a pneumonia, urinary infection, blood clot, heart attack or stroke. Keep contact with your primary care doctor for regular follow ups.  Sleep. A good nights sleep is healthy for the brain. Seven hours is recommended. If you have insomnia or poor sleep habits we can give you some instructions. If you have sleep apnea wear your mask.  Diet: Eating a heart healthy diet is also a good idea; fish and poultry instead of red meat, nuts (mostly non-peanuts), vegetables, fruits, olive oil or canola oil (instead of butter), minimal salt (use other spices to flavor foods), whole grain rice, bread, cereal and pasta and wine in moderation.Research is now showing that the MIND diet, which is a combination of The Mediterranean diet and the DASH diet, is beneficial for cognitive processing and longevity. Information about this diet can be found in The MIND Diet, a book by Doyne Keel, MS, RDN, and online at NotebookDistributors.si  Finances, Power of Attorney and  Advance Directives: You should consider putting legal safeguards in place with regard to financial and medical decision making. While the spouse always has power of attorney for medical and financial issues in the absence of any form, you should consider what you want in case the spouse / caregiver is no longer around or capable of making decisions.

## 2022-06-14 NOTE — Progress Notes (Signed)
[facility]  PATIENT: Monica Bauer DOB: 1956/06/23  REQUESTING CLINICIAN: Lianne Cure, DO HISTORY FROM: Patient and sister in law  REASON FOR VISIT: Stroke follow up   HISTORICAL  CHIEF COMPLAINT:  Chief Complaint  Patient presents with   New Patient (Initial Visit)    Rm 12, NP/ED referral for memory loss and CVA post hospital follow up States she is doing well, no new questions     HISTORY OF PRESENT ILLNESS:  This is a 66 year old woman past medical history of hypertension, hyperlipidemia who is presenting after right frontal stroke follow-up.  Patient is accompanied by her sister-in-law.  Patient was taken to the hospital by sister-in-law due to increased confusion, forgetfulness, and during initial work-up was found to have a right frontoparietal stroke.  Stroke etiology likely small vessel disease.  She did have vessel imaging which showed no large vessel stenosis.  Echo showed a EF of 55 to 60% and normal left ventricular function.  Due to ongoing encephalopathy she did have an EEG which was negative for any acute seizures.  Per sister-in-law patient is under a lot of stress, she is taking care of her bedridden mother by herself she is not getting any support.  Lately she has an aid that comes for 1 hour in the morning but otherwise she is taking care of her  which is extremely stressful for patient.    TBI:   No past history of TBI Stroke:  Right frontal, parietal stroke Seizures:   no past history of seizures Sleep:   no history of sleep apnea.   Mood:  Increase stress due to taking care of bedridden mother  Functional status: independent in all most ADLs and IADLs Patient lives with mother. Cooking: patient  Cleaning: patient  Shopping: patient  Bathing: patient Toileting: patient  Driving: Currently not driving Bills: patient   Ever left the stove on by accident?: denies Forget how to use items around the house?: denies Getting lost  going to familiar places?: denies Forgetting loved ones names?: denies Word finding difficulty? denies Sleep: Report 3 to 4 hours per night   OTHER MEDICAL CONDITIONS: Hypertension, hyperlipidemia, cognitive impairment   REVIEW OF SYSTEMS: Full 14 system review of systems performed and negative with exception of: as noted in the HPI   ALLERGIES: Allergies  Allergen Reactions   Other     Pt does not know med allergy    HOME MEDICATIONS: Outpatient Medications Prior to Visit  Medication Sig Dispense Refill   amLODipine (NORVASC) 10 MG tablet Take 10 mg by mouth daily.     aspirin EC 81 MG tablet Take 1 tablet (81 mg total) by mouth daily. Swallow whole. 30 tablet 12   atorvastatin (LIPITOR) 40 MG tablet Take 1 tablet (40 mg total) by mouth daily. 30 tablet 0   No facility-administered medications prior to visit.    PAST MEDICAL HISTORY: Past Medical History:  Diagnosis Date   Breast cancer ([place])    Diabetes mellitus without complication ([place])    Hyperlipidemia    Hypertension    Stroke ([place])     PAST SURGICAL HISTORY: Past Surgical History:  Procedure Laterality Date   BREAST LUMPECTOMY      FAMILY HISTORY: History reviewed. No pertinent family history.  SOCIAL HISTORY: Social History   Socioeconomic History   Marital status: Single    Spouse name: Not on file   Number of children: Not on file   Years of education: Not on file  Highest education level: Not on file  Occupational History   Not on file  Tobacco Use   Smoking status: Former   Smokeless tobacco: Never  Substance and Sexual Activity   Alcohol use: No   Drug use: Never   Sexual activity: Not on file  Other Topics Concern   Not on file  Social History Narrative   Not on file   Social Determinants of Health   Financial Resource Strain: Not on file  Food Insecurity: Not on file  Transportation Needs: Not on file  Physical Activity: Not on file  Stress: Not on file  Social Connections:  Not on file  Intimate Partner Violence: Not on file    PHYSICAL EXAM  GENERAL EXAM/CONSTITUTIONAL: Vitals:  Vitals:   06/14/22 0946  BP: 126/62  Pulse: 73  Weight: 207 lb (93.9 kg)  Height: '5\' 2"'$  (1.575 m)   Body mass index is 37.86 kg/m. Wt Readings from Last 3 Encounters:  06/14/22 207 lb (93.9 kg)  06/06/22 204 lb 12.8 oz (92.9 kg)  05/19/22 199 lb 15.3 oz (90.7 kg)   Patient is in no distress; well developed, nourished and groomed; neck is supple  EYES: Pupils round and reactive to light, Visual fields full to confrontation, Extraocular movements intacts,   MUSCULOSKELETAL: Gait, strength, tone, movements noted in Neurologic exam below  NEUROLOGIC: MENTAL STATUS:      No data to display              06/14/2022    9:52 AM  Montreal Cognitive Assessment   Visuospatial/ Executive (0/5) 4  Naming (0/3) 3  Attention: Read list of digits (0/2) 2  Attention: Read list of letters (0/1) 1  Attention: Serial 7 subtraction starting at 100 (0/3) 1  Language: Repeat phrase (0/2) 2  Language : Fluency (0/1) 1  Abstraction (0/2) 2  Delayed Recall (0/5) 1  Orientation (0/6) 5  Total 22    CRANIAL NERVE:  2nd, 3rd, 4th, 6th - pupils equal and reactive to light, visual fields full to confrontation, extraocular muscles intact, no nystagmus 5th - facial sensation symmetric 7th - facial strength symmetric 8th - hearing intact 9th - palate elevates symmetrically, uvula midline 11th - shoulder shrug symmetric 12th - tongue protrusion midline  MOTOR:  normal bulk and tone, full strength in the BUE, BLE  SENSORY:  normal and symmetric to light touch  COORDINATION:  finger-nose-finger, fine finger movements normal  REFLEXES:  deep tendon reflexes present and symmetric  GAIT/STATION:  normal    DIAGNOSTIC DATA (LABS, IMAGING, TESTING) - I reviewed patient records, labs, notes, testing and imaging myself where available.  Lab Results  Component Value Date    WBC 10.5 05/23/2022   HGB 13.0 05/23/2022   HCT 39 05/23/2022   MCV 86.4 05/20/2022   PLT 287 05/23/2022      Component Value Date/Time   NA 141 05/23/2022 0000   K 4.1 05/23/2022 0000   CL 106 05/23/2022 0000   CO2 25 (A) 05/23/2022 0000   GLUCOSE 99 05/20/2022 0042   BUN 15 05/23/2022 0000   CREATININE 0.9 05/23/2022 0000   CREATININE 0.92 05/20/2022 0042   CALCIUM 9.5 05/23/2022 0000   PROT 7.0 05/18/2022 1104   ALBUMIN 4.1 05/23/2022 0000   AST 20 05/23/2022 0000   ALT 22 05/23/2022 0000   ALKPHOS 60 05/23/2022 0000   BILITOT 1.1 05/18/2022 1104   GFRNONAA >60 05/20/2022 0042   GFRAA >60 10/28/2019 0257   Lab Results  Component Value Date   CHOL 186 05/23/2022   HDL 64 05/23/2022   LDLCALC 104 05/23/2022   TRIG 85 05/23/2022   CHOLHDL 3.3 05/19/2022   Lab Results  Component Value Date   HGBA1C 6.5 (H) 05/19/2022   Lab Results  Component Value Date   VITAMINB12 2,064 (H) 05/19/2022   Lab Results  Component Value Date   TSH 0.74 05/23/2022    MRI Brain 05/18/22 5 mm acute up infarct right frontal parietal white matter. Moderate chronic microvascular ischemic change   MRA head and neck 05/18/22 Motion degraded study of the neck and head No significant intracranial or extracranial stenosis. No intracranial large vessel occlusion  ASSESSMENT AND PLAN  66 y.o. year old female with vascular risk factors including hypertension, hyperlipidemia who is presenting after being diagnosed with a right frontoparietal stroke.  Stroke etiology likely small vessel disease.  Patient was recommended aspirin 81 mg and amlodipine.  She was also recommended Lipitor but has not started the medication.  I strongly advised patient to start statin.   When it comes to her cognitive impairment, and her taking care of her mother I do agree that stress can cause the memory problem.  Sister-in-law is concerned that patient is unable to take care of her mother therefore I strongly  advised him to consider looking into a nursing facility where they can care for her mom and she can going visit every day.  She will look into it.  I will see her in 6 months for follow-up.   1. Cerebrovascular accident (CVA) due to occlusion of cerebral artery ([place])   2. Mild cognitive impairment      Patient Instructions  Continue with Aspirin 81 mg and Amlodipine daily  Start with Lipitor 40 mg daily  Consider nursing facility for your mother as you are having difficulty taking care of her at your home  Return in 6 months    There are well-accepted and sensible ways to reduce risk for Alzheimers disease and other degenerative brain disorders .  Exercise Daily Walk A daily 20 minute walk should be part of your routine. Disease related apathy can be a significant roadblock to exercise and the only way to overcome this is to make it a daily routine and perhaps have a reward at the end (something your loved one loves to eat or drink perhaps) or a personal trainer coming to the home can also be very useful. Most importantly, the patient is much more likely to exercise if the caregiver / spouse does it with him/her. In general a structured, repetitive schedule is best.  General Health: Any diseases which effect your body will effect your brain such as a pneumonia, urinary infection, blood clot, heart attack or stroke. Keep contact with your primary care doctor for regular follow ups.  Sleep. A good nights sleep is healthy for the brain. Seven hours is recommended. If you have insomnia or poor sleep habits we can give you some instructions. If you have sleep apnea wear your mask.  Diet: Eating a heart healthy diet is also a good idea; fish and poultry instead of red meat, nuts (mostly non-peanuts), vegetables, fruits, olive oil or canola oil (instead of butter), minimal salt (use other spices to flavor foods), whole grain rice, bread, cereal and pasta and wine in moderation.Research is now showing  that the MIND diet, which is a combination of The Mediterranean diet and the DASH diet, is beneficial for cognitive processing and longevity. Information about  this diet can be found in The MIND Diet, a book by Doyne Keel, MS, RDN, and online at [employer].si  Finances, Power of Attorney and Advance Directives: You should consider putting legal safeguards in place with regard to financial and medical decision making. While the spouse always has power of attorney for medical and financial issues in the absence of any form, you should consider what you want in case the spouse / caregiver is no longer around or capable of making decisions.      No orders of the defined types were placed in this encounter.   Meds ordered this encounter  Medications   atorvastatin (LIPITOR) 40 MG tablet    Sig: Take 1 tablet (40 mg total) by mouth daily.    Dispense:  90 tablet    Refill:  3    Return in about 6 months (around 12/15/2022).  I have spent a total of 50 minutes dedicated to this patient today, preparing to see patient, performing a medically appropriate examination and evaluation, ordering tests and/or medications and procedures, and counseling and educating the patient/family/caregiver; independently interpreting result and communicating results to the family/patient/caregiver; and documenting clinical information in the electronic medical record.   Alric Ran, MD 06/14/2022, 5:28 PM  [facility] [address] [place], [place] [address] [phone]

## 2022-06-27 ENCOUNTER — Inpatient Hospital Stay: Payer: Medicare Other | Admitting: Neurology

## 2022-12-07 ENCOUNTER — Ambulatory Visit: Payer: Medicare Other | Admitting: Family Medicine

## 2022-12-07 ENCOUNTER — Ambulatory Visit: Payer: PRIVATE HEALTH INSURANCE | Admitting: Family Medicine

## 2022-12-07 ENCOUNTER — Encounter: Payer: Self-pay | Admitting: Family Medicine

## 2022-12-07 VITALS — BP 158/92 | HR 83 | Temp 98.3°F | Resp 99 | Ht 62.0 in | Wt 220.0 lb

## 2022-12-07 DIAGNOSIS — E119 Type 2 diabetes mellitus without complications: Secondary | ICD-10-CM | POA: Diagnosis not present

## 2022-12-07 DIAGNOSIS — R011 Cardiac murmur, unspecified: Secondary | ICD-10-CM

## 2022-12-07 DIAGNOSIS — I10 Essential (primary) hypertension: Secondary | ICD-10-CM

## 2022-12-07 DIAGNOSIS — Z1211 Encounter for screening for malignant neoplasm of colon: Secondary | ICD-10-CM | POA: Diagnosis not present

## 2022-12-07 DIAGNOSIS — E785 Hyperlipidemia, unspecified: Secondary | ICD-10-CM

## 2022-12-07 LAB — CBC WITH DIFFERENTIAL/PLATELET
Basophils Absolute: 0.1 10*3/uL (ref 0.0–0.1)
Basophils Relative: 0.8 % (ref 0.0–3.0)
Eosinophils Absolute: 0.2 10*3/uL (ref 0.0–0.7)
Eosinophils Relative: 1.8 % (ref 0.0–5.0)
HCT: 36 % (ref 36.0–46.0)
Hemoglobin: 12 g/dL (ref 12.0–15.0)
Lymphocytes Relative: 18.4 % (ref 12.0–46.0)
Lymphs Abs: 1.8 10*3/uL (ref 0.7–4.0)
MCHC: 33.2 g/dL (ref 30.0–36.0)
MCV: 84.6 fl (ref 78.0–100.0)
Monocytes Absolute: 0.6 10*3/uL (ref 0.1–1.0)
Monocytes Relative: 6.4 % (ref 3.0–12.0)
Neutro Abs: 7.1 10*3/uL (ref 1.4–7.7)
Neutrophils Relative %: 72.6 % (ref 43.0–77.0)
Platelets: 246 10*3/uL (ref 150.0–400.0)
RBC: 4.26 Mil/uL (ref 3.87–5.11)
RDW: 15 % (ref 11.5–15.5)
WBC: 9.8 10*3/uL (ref 4.0–10.5)

## 2022-12-07 LAB — LIPID PANEL
Cholesterol: 247 mg/dL — ABNORMAL HIGH (ref 0–200)
HDL: 94.5 mg/dL (ref >39.00)
LDL Cholesterol: 140 mg/dL — ABNORMAL HIGH (ref 0–99)
NonHDL: 152.18
Total CHOL/HDL Ratio: 3
Triglycerides: 61 mg/dL (ref 0.0–149.0)
VLDL: 12.2 mg/dL (ref 0.0–40.0)

## 2022-12-07 LAB — COMPREHENSIVE METABOLIC PANEL
ALT: 23 U/L (ref 0–35)
AST: 18 U/L (ref 0–37)
Albumin: 4.2 g/dL (ref 3.5–5.2)
Alkaline Phosphatase: 61 U/L (ref 39–117)
BUN: 14 mg/dL (ref 6–23)
CO2: 30 mEq/L (ref 19–32)
Calcium: 9.5 mg/dL (ref 8.4–10.5)
Chloride: 104 mEq/L (ref 96–112)
Creatinine, Ser: 0.8 mg/dL (ref 0.40–1.20)
GFR: 76.49 mL/min (ref >60.00)
Glucose, Bld: 106 mg/dL — ABNORMAL HIGH (ref 70–99)
Potassium: 3.8 mEq/L (ref 3.5–5.1)
Sodium: 142 mEq/L (ref 135–145)
Total Bilirubin: 0.7 mg/dL (ref 0.2–1.2)
Total Protein: 7.3 g/dL (ref 6.0–8.3)

## 2022-12-07 LAB — MICROALBUMIN / CREATININE URINE RATIO
Creatinine,U: 135.7 mg/dL
Microalb Creat Ratio: 47.5 mg/g — ABNORMAL HIGH (ref 0.0–30.0)
Microalb, Ur: 64.4 mg/dL — ABNORMAL HIGH (ref 0.0–1.9)

## 2022-12-07 LAB — HEMOGLOBIN A1C: Hgb A1c MFr Bld: 7.3 % — ABNORMAL HIGH (ref 4.6–6.5)

## 2022-12-07 MED ORDER — AMLODIPINE BESYLATE 10 MG PO TABS: 10.0000 mg | ORAL_TABLET | Freq: Every day | ORAL | 1 refills | Status: AC

## 2022-12-07 MED ORDER — LOSARTAN POTASSIUM 100 MG PO TABS: 100.0000 mg | ORAL_TABLET | Freq: Every day | ORAL | 1 refills | Status: AC

## 2022-12-07 NOTE — Progress Notes (Signed)
Established Patient Office Visit  Subjective   Patient ID: Monica Bauer, female    DOB: [birth year]  Age: 67 y.o. MRN: [id number]  Chief Complaint  Patient presents with   6 months follow up     HTN    HPI  Hypertension: - Medications: amlodipine 10 mg daily, losartan 100 mg daily - Compliance: good - Checking BP at home:  - Denies any SOB, recurrent headaches, CP, vision changes, LE edema, dizziness, palpitations, or medication side effects. - Diet: general - Exercise: home exercises  BP Readings from Last 3 Encounters:  12/07/22 (!) 158/92  06/14/22 126/62  06/06/22 124/67     Diabetes: - Checking BG at home: no - Medications: none, lifestyle measures - Compliance: good - Diet: general  - Eye exam: reports she just had one in August  - Foot exam: today  - Microalbumin: today - Denies symptoms of hypoglycemia, polyuria, polydipsia, numbness extremities, foot ulcers/trauma, wounds that are not healing, medication side effects  Lab Results  Component Value Date   HGBA1C 6.5 (H) 05/19/2022     Hyperlipidemia: - medications: none - compliance: states she has not been taking lipitor which was previously on her list - medication SEs: n/a The ASCVD Risk score (Arnett DK, et al., 2019) failed to calculate for the following reasons:   The patient has a prior MI or stroke diagnosis          ROS All review of systems negative except what is listed in the HPI    Objective:     BP (!) 158/92   Pulse 83   Temp 98.3 F (36.8 C)   Resp (!) 99   Ht '5\' 2"'$  (1.575 m)   Wt 220 lb (99.8 kg)   BMI 40.24 kg/m    Physical Exam Vitals reviewed.  Constitutional:      Appearance: Normal appearance.  Cardiovascular:     Rate and Rhythm: Normal rate and regular rhythm.     Pulses: Normal pulses.     Heart sounds: Murmur heard.  Pulmonary:     Effort: Pulmonary effort is normal.     Breath sounds: Normal breath sounds.  Feet:     Comments: Diabetic  foot exam was performed.  No deformities or other abnormal visual findings.  Posterior tibialis and dorsalis pulse intact bilaterally.  Intact to touch and monofilament testing bilaterally.   Skin:    General: Skin is warm and dry.  Neurological:     Mental Status: She is alert and oriented to person, place, and time.  Psychiatric:        Mood and Affect: Mood normal.        Behavior: Behavior normal.        Thought Content: Thought content normal.        Judgment: Judgment normal.      No results found for any visits on 12/07/22.    The ASCVD Risk score (Arnett DK, et al., 2019) failed to calculate for the following reasons:   The patient has a prior MI or stroke diagnosis    Assessment & Plan:   Problem List Items Addressed This Visit       Cardiovascular and Mediastinum   Hypertension (Chronic)    Blood pressure is not at goal for age and co-morbidities.   Recommendations: continue amlodipine and losartan, monitor at home, come back in 2 weeks for follow-up - BP goal <130/80 - monitor and log blood pressures at home - check around  the same time each day in a relaxed setting - Limit salt to <2000 mg/day - Follow DASH eating plan (heart healthy diet) - limit alcohol to 2 standard drinks per day for men and 1 per day for women - avoid tobacco products - get at least 2 hours of regular aerobic exercise weekly Patient aware of signs/symptoms requiring further/urgent evaluation.      Relevant Medications   amLODipine (NORVASC) 10 MG tablet   losartan (COZAAR) 100 MG tablet   Other Relevant Orders   CBC with Differential/Platelet   Comprehensive metabolic panel   Lipid panel     Endocrine   Diabetes mellitus without complication (HCC) (Chronic) Lifestyle management for now Updating labs   Relevant Medications   losartan (COZAAR) 100 MG tablet   Other Relevant Orders   Lipid panel   Microalbumin / creatinine urine ratio   Hemoglobin A1c   Other Visit  Diagnoses     Murmur, cardiac    -  Primary No other symptoms.  Adding echo to further evaluate   Relevant Orders   ECHOCARDIOGRAM COMPLETE   Screen for colon cancer       Relevant Orders   Ambulatory referral to Gastroenterology       Return in about 2 weeks (around 12/21/2022) for BP f/u with Lovena Le .    Terrilyn Saver, NP

## 2022-12-07 NOTE — Patient Instructions (Addendum)
Blood pressure is not at goal for age and co-morbidities.   Recommendations: continue amlodipine and losartan, monitor at home, come back in 2 weeks for follow-up - BP goal <130/80 - monitor and log blood pressures at home - check around the same time each day in a relaxed setting - Limit salt to <2000 mg/day - Follow DASH eating plan (heart healthy diet) - limit alcohol to 2 standard drinks per day for men and 1 per day for women - avoid tobacco products - get at least 2 hours of regular aerobic exercise weekly Patient aware of signs/symptoms requiring further/urgent evaluation.  Updating your labs today.  Continue low-carb, heart healthy diet and all medications as prescribed  Ordering an echocardiogram (ultrasound of your heart) to check murmur - they will call you to schedule

## 2022-12-07 NOTE — Assessment & Plan Note (Signed)
Blood pressure is not at goal for age and co-morbidities.   Recommendations: continue amlodipine and losartan, monitor at home, come back in 2 weeks for follow-up - BP goal <130/80 - monitor and log blood pressures at home - check around the same time each day in a relaxed setting - Limit salt to <2000 mg/day - Follow DASH eating plan (heart healthy diet) - limit alcohol to 2 standard drinks per day for men and 1 per day for women - avoid tobacco products - get at least 2 hours of regular aerobic exercise weekly Patient aware of signs/symptoms requiring further/urgent evaluation.

## 2022-12-09 MED ORDER — METFORMIN HCL 500 MG PO TABS: 500.0000 mg | ORAL_TABLET | Freq: Two times a day (BID) | ORAL | 3 refills | Status: DC

## 2022-12-09 MED ORDER — ATORVASTATIN CALCIUM 40 MG PO TABS: 40.0000 mg | ORAL_TABLET | Freq: Every day | ORAL | 1 refills | Status: AC

## 2022-12-09 NOTE — Addendum Note (Signed)
Addended by: Caleen Jobs B on: 12/09/2022 01:40 PM   Modules accepted: Orders

## 2022-12-15 ENCOUNTER — Ambulatory Visit (INDEPENDENT_AMBULATORY_CARE_PROVIDER_SITE_OTHER): Payer: Medicare Other | Admitting: Neurology

## 2022-12-15 ENCOUNTER — Encounter: Payer: Self-pay | Admitting: Neurology

## 2022-12-15 VITALS — BP 149/79 | HR 83 | Ht 61.0 in | Wt 218.0 lb

## 2022-12-15 DIAGNOSIS — I6350 Cerebral infarction due to unspecified occlusion or stenosis of unspecified cerebral artery: Secondary | ICD-10-CM

## 2022-12-15 NOTE — Patient Instructions (Signed)
Continue current medications Follow-up in 1 year or sooner if worse.

## 2022-12-15 NOTE — Progress Notes (Signed)
[facility]  PATIENT: Monica Bauer DOB: Oct 07, 1956  REQUESTING CLINICIAN: Terrilyn Saver, NP HISTORY FROM: Patient and sister in law  REASON FOR VISIT: Stroke follow up   HISTORICAL  CHIEF COMPLAINT:  Chief Complaint  Patient presents with   Follow-up    Rm 12. Accompanied by Monica Bauer. Pt states she is doing well.    INTERVAL HISTORY 12/15/2022:  Patient presents today for follow-up, overall she states that she is doing well, no complaint, no concerns, and no deficits.  She is still taking care of her mother with dementia and end-stage renal disease on dialysis.  She reports that her sleep is okay, she does exercise daily, denies any falls and overall she is doing well.  She is not interested in taking her mother to a nursing home. She is compliant with her medication including Amlodipine, Losartan and Atorvastatin.    HISTORY OF PRESENT ILLNESS:  This is a 67 year old woman past medical history of hypertension, hyperlipidemia who is presenting after right frontal stroke follow-up.  Patient is accompanied by her sister-in-law.  Patient was taken to the hospital by sister-in-law due to increased confusion, forgetfulness, and during initial work-up was found to have a right frontoparietal stroke.  Stroke etiology likely small vessel disease.  She did have vessel imaging which showed no large vessel stenosis.  Echo showed a EF of 55 to 60% and normal left ventricular function.  Due to ongoing encephalopathy she did have an EEG which was negative for any acute seizures.  Per sister-in-law patient is under a lot of stress, she is taking care of her bedridden mother by herself she is not getting any support.  Lately she has an aid that comes for 1 hour in the morning but otherwise she is taking care of her  which is extremely stressful for patient.    TBI:   No past history of TBI Stroke:  Right frontal, parietal stroke Seizures:   no past history of  seizures Sleep:   no history of sleep apnea.   Mood:  Increase stress due to taking care of bedridden mother  Functional status: independent in all most ADLs and IADLs Patient lives with mother. Cooking: patient  Cleaning: patient  Shopping: patient  Bathing: patient Toileting: patient  Driving: Currently not driving Bills: patient   Ever left the stove on by accident?: denies Forget how to use items around the house?: denies Getting lost going to familiar places?: denies Forgetting loved ones names?: denies Word finding difficulty? denies Sleep: Report 3 to 4 hours per night   OTHER MEDICAL CONDITIONS: Hypertension, hyperlipidemia, cognitive impairment   REVIEW OF SYSTEMS: Full 14 system review of systems performed and negative with exception of: as noted in the HPI   ALLERGIES: Allergies  Allergen Reactions   Other     Pt does not know med allergy; believes it was a diabetic medication    HOME MEDICATIONS: Outpatient Medications Prior to Visit  Medication Sig Dispense Refill   amLODipine (NORVASC) 10 MG tablet Take 1 tablet (10 mg total) by mouth daily. 90 tablet 1   atorvastatin (LIPITOR) 40 MG tablet Take 1 tablet (40 mg total) by mouth daily. 90 tablet 1   losartan (COZAAR) 100 MG tablet Take 1 tablet (100 mg total) by mouth daily. 90 tablet 1   metFORMIN (GLUCOPHAGE) 500 MG tablet Take 1 tablet (500 mg total) by mouth 2 (two) times daily with a meal. 180 tablet 3   No facility-administered medications prior  to visit.    PAST MEDICAL HISTORY: Past Medical History:  Diagnosis Date   Breast cancer ([place])    Diabetes mellitus without complication ([place])    Hyperlipidemia    Hypertension    Stroke ([place])     PAST SURGICAL HISTORY: Past Surgical History:  Procedure Laterality Date   BREAST LUMPECTOMY      FAMILY HISTORY: History reviewed. No pertinent family history.  SOCIAL HISTORY: Social History   Socioeconomic History   Marital status: Single     Spouse name: Not on file   Number of children: Not on file   Years of education: Not on file   Highest education level: Not on file  Occupational History   Not on file  Tobacco Use   Smoking status: Former   Smokeless tobacco: Never  Substance and Sexual Activity   Alcohol use: No   Drug use: Never   Sexual activity: Not on file  Other Topics Concern   Not on file  Social History Narrative   Not on file   Social Determinants of Health   Financial Resource Strain: Not on file  Food Insecurity: Not on file  Transportation Needs: Not on file  Physical Activity: Not on file  Stress: Not on file  Social Connections: Not on file  Intimate Partner Violence: Not on file    PHYSICAL EXAM  GENERAL EXAM/CONSTITUTIONAL: Vitals:  Vitals:   12/15/22 1021  BP: (!) 149/79  Pulse: 83  Weight: 218 lb (98.9 kg)  Height: '5\' 1"'$  (1.549 m)   Body mass index is 41.19 kg/m. Wt Readings from Last 3 Encounters:  12/15/22 218 lb (98.9 kg)  12/07/22 220 lb (99.8 kg)  06/14/22 207 lb (93.9 kg)   Patient is in no distress; well developed, nourished and groomed; neck is supple  EYES: Visual fields full to confrontation, Extraocular movements intacts,   MUSCULOSKELETAL: Gait, strength, tone, movements noted in Neurologic exam below  NEUROLOGIC: MENTAL STATUS:      No data to display              06/14/2022    9:52 AM  Montreal Cognitive Assessment   Visuospatial/ Executive (0/5) 4  Naming (0/3) 3  Attention: Read list of digits (0/2) 2  Attention: Read list of letters (0/1) 1  Attention: Serial 7 subtraction starting at 100 (0/3) 1  Language: Repeat phrase (0/2) 2  Language : Fluency (0/1) 1  Abstraction (0/2) 2  Delayed Recall (0/5) 1  Orientation (0/6) 5  Total 22    CRANIAL NERVE:  2nd, 3rd, 4th, 6th -visual fields full to confrontation, extraocular muscles intact, no nystagmus 5th - facial sensation symmetric 7th - facial strength symmetric 8th - hearing  intact 9th - palate elevates symmetrically, uvula midline 11th - shoulder shrug symmetric 12th - tongue protrusion midline  MOTOR:  normal bulk and tone, full strength in the BUE, BLE  SENSORY:  normal and symmetric to light touch  COORDINATION:  finger-nose-finger, fine finger movements normal  GAIT/STATION:  normal    DIAGNOSTIC DATA (LABS, IMAGING, TESTING) - I reviewed patient records, labs, notes, testing and imaging myself where available.  Lab Results  Component Value Date   WBC 9.8 12/07/2022   HGB 12.0 12/07/2022   HCT 36.0 12/07/2022   MCV 84.6 12/07/2022   PLT 246.0 12/07/2022      Component Value Date/Time   NA 142 12/07/2022 1152   NA 141 05/23/2022 0000   K 3.8 12/07/2022 1152   CL  104 12/07/2022 1152   CO2 30 12/07/2022 1152   GLUCOSE 106 (H) 12/07/2022 1152   BUN 14 12/07/2022 1152   BUN 15 05/23/2022 0000   CREATININE 0.80 12/07/2022 1152   CALCIUM 9.5 12/07/2022 1152   PROT 7.3 12/07/2022 1152   ALBUMIN 4.2 12/07/2022 1152   AST 18 12/07/2022 1152   ALT 23 12/07/2022 1152   ALKPHOS 61 12/07/2022 1152   BILITOT 0.7 12/07/2022 1152   GFRNONAA >60 05/20/2022 0042   GFRAA >60 10/28/2019 0257   Lab Results  Component Value Date   CHOL 247 (H) 12/07/2022   HDL 94.50 12/07/2022   LDLCALC 140 (H) 12/07/2022   TRIG 61.0 12/07/2022   CHOLHDL 3 12/07/2022   Lab Results  Component Value Date   HGBA1C 7.3 (H) 12/07/2022   Lab Results  Component Value Date   VITAMINB12 2,064 (H) 05/19/2022   Lab Results  Component Value Date   TSH 0.74 05/23/2022    MRI Brain 05/18/22 5 mm acute up infarct right frontal parietal white matter. Moderate chronic microvascular ischemic change   MRA head and neck 05/18/22 Motion degraded study of the neck and head No significant intracranial or extracranial stenosis. No intracranial large vessel occlusion  ASSESSMENT AND PLAN  67 y.o. year old female with vascular risk factors including hypertension,  hyperlipidemia who is presenting for follow-up.  Overall she is stable, does not have any complaints or concerns.  She is still taking care of her mother and she is not interested in admitting her to a nursing home for her dementia.  She is compliant with her medications. Advised patient to continue current medications, increase exercise and I will see her in 1 year for follow-up.  She voiced understanding.    1. Cerebrovascular accident (CVA) due to occlusion of cerebral artery [facility])     Patient Instructions  Continue current medications Follow-up in 1 year or sooner if worse.  No orders of the defined types were placed in this encounter.   No orders of the defined types were placed in this encounter.   Return in about 1 year (around 12/16/2023).    Alric Ran, MD 12/15/2022, 10:47 AM  [facility] [address] [place], [place] [address] [phone]

## 2022-12-19 ENCOUNTER — Ambulatory Visit (INDEPENDENT_AMBULATORY_CARE_PROVIDER_SITE_OTHER): Payer: Medicare Other | Admitting: Family Medicine

## 2022-12-19 ENCOUNTER — Encounter: Payer: Self-pay | Admitting: Family Medicine

## 2022-12-19 ENCOUNTER — Ambulatory Visit: Payer: Medicare Other | Admitting: Family Medicine

## 2022-12-19 ENCOUNTER — Telehealth: Payer: Self-pay | Admitting: Family Medicine

## 2022-12-19 VITALS — BP 135/80 | HR 88 | Temp 97.4°F | Resp 16 | Ht 62.0 in | Wt 224.2 lb

## 2022-12-19 DIAGNOSIS — E119 Type 2 diabetes mellitus without complications: Secondary | ICD-10-CM | POA: Diagnosis not present

## 2022-12-19 MED ORDER — CANAGLIFLOZIN 100 MG PO TABS: 100.0000 mg | ORAL_TABLET | Freq: Every day | ORAL | 3 refills | Status: AC

## 2022-12-19 MED ORDER — BLOOD GLUCOSE MONITOR KIT: PACK | 0 refills | Status: AC

## 2022-12-19 NOTE — Progress Notes (Signed)
   Established Patient Office Visit  Subjective   Patient ID: Monica Bauer, female    DOB: [birth year]  Age: 67 y.o. MRN: [id number]  Chief Complaint  Patient presents with   discuss medication    HPI   Patient established care here on 12/07/22. Labs at that time revealed HLD (she agreed to start Lipitor at that time), A1c 7.3%, microalbumin-creatinine urine ration 47.5%. Patient states that she tried metformin in the past, but she could not tolerate the GI side effects, so she stopped taking it and was never placed on any other diabetic medications. Reports she eats a relatively healthy diet overall, but she does like sweets. When discussed medication options today, patient is adamant about not trying injectables if at all possible. She has a medical history significant for HTN, breast cancer, CVA.          ROS All review of systems negative except what is listed in the HPI    Objective:     BP 135/80   Pulse 88   Temp (!) 97.4 F (36.3 C)   Resp 16   Ht '5\' 2"'$  (1.575 m)   Wt 224 lb 3.2 oz (101.7 kg)   SpO2 98%   BMI 41.01 kg/m    Physical Exam Vitals reviewed.  Constitutional:      Appearance: Normal appearance. She is obese.  Cardiovascular:     Rate and Rhythm: Normal rate and regular rhythm.     Pulses: Normal pulses.     Heart sounds: Normal heart sounds.  Pulmonary:     Effort: Pulmonary effort is normal.     Breath sounds: Normal breath sounds.  Skin:    General: Skin is warm and dry.  Neurological:     Mental Status: She is alert and oriented to person, place, and time.  Psychiatric:        Mood and Affect: Mood normal.        Behavior: Behavior normal.        Thought Content: Thought content normal.        Judgment: Judgment normal.      No results found for any visits on 12/19/22.    The ASCVD Risk score (Arnett DK, et al., 2019) failed to calculate for the following reasons:   The patient has a prior MI or stroke diagnosis     Assessment & Plan:   Problem List Items Addressed This Visit       Endocrine   Diabetes mellitus without complication ([place]) - Primary (Chronic)    Discussed options with patient. She is agreeable to try SGLT2i for diabetes management with potential renal and cardiac benefits. Starting Invokana. Sending in glucometer supplies for her. Thorough discussion regarding lifestyle factors to prevent progression of disease and improve overall health.  Medication information provided F/u in 3 months (repeat A1c and microalbumin)      Relevant Medications   canagliflozin (INVOKANA) 100 MG TABS tablet   blood glucose meter kit and supplies KIT   Other Relevant Orders   Comp Met (CMET)    Return in about 3 months (around 03/20/2023) for routine follow-up (A1c, microalbumin).    Terrilyn Saver, NP

## 2022-12-19 NOTE — Telephone Encounter (Signed)
Monica Bauer (relative DPR NOT OK) called stating that she had called the retina doctor and they will send over her records.

## 2022-12-19 NOTE — Assessment & Plan Note (Signed)
Discussed options with patient. She is agreeable to try SGLT2i for diabetes management with potential renal and cardiac benefits. Starting Invokana. Sending in glucometer supplies for her. Thorough discussion regarding lifestyle factors to prevent progression of disease and improve overall health.  Medication information provided F/u in 3 months (repeat A1c and microalbumin)

## 2022-12-20 LAB — COMPREHENSIVE METABOLIC PANEL
ALT: 33 U/L (ref 0–35)
AST: 25 U/L (ref 0–37)
Albumin: 4 g/dL (ref 3.5–5.2)
Alkaline Phosphatase: 60 U/L (ref 39–117)
BUN: 19 mg/dL (ref 6–23)
CO2: 28 mEq/L (ref 19–32)
Calcium: 9.2 mg/dL (ref 8.4–10.5)
Chloride: 100 mEq/L (ref 96–112)
Creatinine, Ser: 0.86 mg/dL (ref 0.40–1.20)
GFR: 70.12 mL/min (ref >60.00)
Glucose, Bld: 226 mg/dL — ABNORMAL HIGH (ref 70–99)
Potassium: 3.8 mEq/L (ref 3.5–5.1)
Sodium: 137 mEq/L (ref 135–145)
Total Bilirubin: 0.5 mg/dL (ref 0.2–1.2)
Total Protein: 7.1 g/dL (ref 6.0–8.3)

## 2022-12-23 ENCOUNTER — Ambulatory Visit: Payer: Medicare Other | Admitting: Family Medicine

## 2022-12-28 ENCOUNTER — Ambulatory Visit (HOSPITAL_BASED_OUTPATIENT_CLINIC_OR_DEPARTMENT_OTHER): Payer: Medicare Other | Attending: Family Medicine

## 2023-01-02 ENCOUNTER — Ambulatory Visit: Payer: Medicare Other | Admitting: Family Medicine

## 2023-01-11 ENCOUNTER — Ambulatory Visit (HOSPITAL_BASED_OUTPATIENT_CLINIC_OR_DEPARTMENT_OTHER)
Admission: RE | Admit: 2023-01-11 | Discharge: 2023-01-11 | Disposition: A | Discharge status: Home / Self Care | Payer: Medicare Other | Source: Ambulatory Visit | Attending: Family Medicine | Admitting: Family Medicine

## 2023-01-11 DIAGNOSIS — R011 Cardiac murmur, unspecified: Secondary | ICD-10-CM

## 2023-01-11 LAB — ECHOCARDIOGRAM COMPLETE
Area-P 1/2: 3.68 cm2
S' Lateral: 2.7 cm

## 2023-03-07 ENCOUNTER — Telehealth: Payer: Self-pay | Admitting: Family Medicine

## 2023-03-07 NOTE — Telephone Encounter (Signed)
Contacted Monica Bauer to schedule their annual wellness visit. Appointment made for 03/14/2023.  [name]; Care Guide Ambulatory Clinical Support [place] [facility] Direct Dial: [phone]

## 2023-03-14 ENCOUNTER — Telehealth: Payer: Self-pay | Admitting: Family Medicine

## 2023-03-14 ENCOUNTER — Ambulatory Visit (INDEPENDENT_AMBULATORY_CARE_PROVIDER_SITE_OTHER): Payer: Medicare Other | Admitting: *Deleted

## 2023-03-14 DIAGNOSIS — Z0000 Encounter for general adult medical examination without abnormal findings: Secondary | ICD-10-CM

## 2023-03-14 NOTE — Patient Instructions (Signed)
Monica Bauer , Thank you for taking time to come for your Medicare Wellness Visit. I appreciate your ongoing commitment to your health goals. Please review the following plan we discussed and let me know if I can assist you in the future.   These are the goals we discussed:  Goals   None     This is a list of the screening recommended for you and due dates:  Health Maintenance  Topic Date Due   Eye exam for diabetics  Never done   DTaP/Tdap/Td vaccine (1 - Tdap) Never done   Zoster (Shingles) Vaccine (1 of 2) Never done   Colon Cancer Screening  Never done   COVID-19 Vaccine (3 - Pfizer risk series) 08/03/2020   Pneumonia Vaccine (2 of 2 - PCV) 12/24/2020   Hemoglobin A1C  06/07/2023   Flu Shot  06/29/2023   Yearly kidney health urinalysis for diabetes  12/08/2023   Complete foot exam   12/08/2023   Yearly kidney function blood test for diabetes  12/20/2023   Mammogram  12/30/2023   Medicare Annual Wellness Visit  03/13/2024   Hepatitis C Screening: USPSTF Recommendation to screen - Ages 18-67 yo.  Completed   HPV Vaccine  Aged Out   DEXA scan (bone density measurement)  Discontinued     Next appointment: Follow up in one year for your annual wellness visit.   Preventive Care 67 Years and Older, Female Preventive care refers to lifestyle choices and visits with your health care provider that can promote health and wellness. What does preventive care include? A yearly physical exam. This is also called an annual well check. Dental exams once or twice a year. Routine eye exams. Ask your health care provider how often you should have your eyes checked. Personal lifestyle choices, including: Daily care of your teeth and gums. Regular physical activity. Eating a healthy diet. Avoiding tobacco and drug use. Limiting alcohol use. Practicing safe sex. Taking low-dose aspirin every day. Taking vitamin and mineral supplements as recommended by your health care provider. What  happens during an annual well check? The services and screenings done by your health care provider during your annual well check will depend on your age, overall health, lifestyle risk factors, and family history of disease. Counseling  Your health care provider may ask you questions about your: Alcohol use. Tobacco use. Drug use. Emotional well-being. Home and relationship well-being. Sexual activity. Eating habits. History of falls. Memory and ability to understand (cognition). Work and work Astronomer. Reproductive health. Screening  You may have the following tests or measurements: Height, weight, and BMI. Blood pressure. Lipid and cholesterol levels. These may be checked every 5 years, or more frequently if you are over 71 years old. Skin check. Lung cancer screening. You may have this screening every year starting at age 50 if you have a 30-pack-year history of smoking and currently smoke or have quit within the past 15 years. Fecal occult blood test (FOBT) of the stool. You may have this test every year starting at age 48. Flexible sigmoidoscopy or colonoscopy. You may have a sigmoidoscopy every 5 years or a colonoscopy every 10 years starting at age 56. Hepatitis C blood test. Hepatitis B blood test. Sexually transmitted disease (STD) testing. Diabetes screening. This is done by checking your blood sugar (glucose) after you have not eaten for a while (fasting). You may have this done every 1-3 years. Bone density scan. This is done to screen for osteoporosis. You may have this  done starting at age 95. Mammogram. This may be done every 1-2 years. Talk to your health care provider about how often you should have regular mammograms. Talk with your health care provider about your test results, treatment options, and if necessary, the need for more tests. Vaccines  Your health care provider may recommend certain vaccines, such as: Influenza vaccine. This is recommended every  year. Tetanus, diphtheria, and acellular pertussis (Tdap, Td) vaccine. You may need a Td booster every 10 years. Zoster vaccine. You may need this after age 20. Pneumococcal 13-valent conjugate (PCV13) vaccine. One dose is recommended after age 13. Pneumococcal polysaccharide (PPSV23) vaccine. One dose is recommended after age 81. Talk to your health care provider about which screenings and vaccines you need and how often you need them. This information is not intended to replace advice given to you by your health care provider. Make sure you discuss any questions you have with your health care provider. Document Released: 12/11/2015 Document Revised: 08/03/2016 Document Reviewed: 09/15/2015 Elsevier Interactive Patient Education  2017 [employer] Prevention in the Home Falls can cause injuries. They can happen to people of all ages. There are many things you can do to make your home safe and to help prevent falls. What can I do on the outside of my home? Regularly fix the edges of walkways and driveways and fix any cracks. Remove anything that might make you trip as you walk through a door, such as a raised step or threshold. Trim any bushes or trees on the path to your home. Use bright outdoor lighting. Clear any walking paths of anything that might make someone trip, such as rocks or tools. Regularly check to see if handrails are loose or broken. Make sure that both sides of any steps have handrails. Any raised decks and porches should have guardrails on the edges. Have any leaves, snow, or ice cleared regularly. Use sand or salt on walking paths during winter. Clean up any spills in your garage right away. This includes oil or grease spills. What can I do in the bathroom? Use night lights. Install grab bars by the toilet and in the tub and shower. Do not use towel bars as grab bars. Use non-skid mats or decals in the tub or shower. If you need to sit down in the shower, use a  plastic, non-slip stool. Keep the floor dry. Clean up any water that spills on the floor as soon as it happens. Remove soap buildup in the tub or shower regularly. Attach bath mats securely with double-sided non-slip rug tape. Do not have throw rugs and other things on the floor that can make you trip. What can I do in the bedroom? Use night lights. Make sure that you have a light by your bed that is easy to reach. Do not use any sheets or blankets that are too big for your bed. They should not hang down onto the floor. Have a firm chair that has side arms. You can use this for support while you get dressed. Do not have throw rugs and other things on the floor that can make you trip. What can I do in the kitchen? Clean up any spills right away. Avoid walking on wet floors. Keep items that you use a lot in easy-to-reach places. If you need to reach something above you, use a strong step stool that has a grab bar. Keep electrical cords out of the way. Do not use floor polish or wax  that makes floors slippery. If you must use wax, use non-skid floor wax. Do not have throw rugs and other things on the floor that can make you trip. What can I do with my stairs? Do not leave any items on the stairs. Make sure that there are handrails on both sides of the stairs and use them. Fix handrails that are broken or loose. Make sure that handrails are as long as the stairways. Check any carpeting to make sure that it is firmly attached to the stairs. Fix any carpet that is loose or worn. Avoid having throw rugs at the top or bottom of the stairs. If you do have throw rugs, attach them to the floor with carpet tape. Make sure that you have a light switch at the top of the stairs and the bottom of the stairs. If you do not have them, ask someone to add them for you. What else can I do to help prevent falls? Wear shoes that: Do not have high heels. Have rubber bottoms. Are comfortable and fit you  well. Are closed at the toe. Do not wear sandals. If you use a stepladder: Make sure that it is fully opened. Do not climb a closed stepladder. Make sure that both sides of the stepladder are locked into place. Ask someone to hold it for you, if possible. Clearly mark and make sure that you can see: Any grab bars or handrails. First and last steps. Where the edge of each step is. Use tools that help you move around (mobility aids) if they are needed. These include: Canes. Walkers. Scooters. Crutches. Turn on the lights when you go into a dark area. Replace any light bulbs as soon as they burn out. Set up your furniture so you have a clear path. Avoid moving your furniture around. If any of your floors are uneven, fix them. If there are any pets around you, be aware of where they are. Review your medicines with your doctor. Some medicines can make you feel dizzy. This can increase your chance of falling. Ask your doctor what other things that you can do to help prevent falls. This information is not intended to replace advice given to you by your health care provider. Make sure you discuss any questions you have with your health care provider. Document Released: 09/10/2009 Document Revised: 04/21/2016 Document Reviewed: 12/19/2014 Elsevier Interactive Patient Education  2017 [employer].

## 2023-03-14 NOTE — Progress Notes (Signed)
Subjective:   Monica Bauer is a 67 y.o. female who presents for Medicare Annual (Subsequent) preventive examination.  I connected with  Monica Bauer on 03/14/23 by a audio enabled telemedicine application and verified that I am speaking with the correct person using two identifiers.  Patient Location: Home  Provider Location: Office/Clinic  I discussed the limitations of evaluation and management by telemedicine. The patient expressed understanding and agreed to proceed.   Review of Systems     Cardiac Risk Factors include: advanced age (>52men, >60 women);diabetes mellitus;hypertension     Objective:    There were no vitals filed for this visit. There is no height or weight on file to calculate BMI.     03/14/2023    1:45 PM 06/06/2022    2:46 PM 05/19/2022    2:20 PM 05/18/2022   10:23 AM 05/17/2022   11:51 AM 03/07/2022    2:15 PM 10/18/2019    6:16 PM  Advanced Directives  Does Patient Have a Medical Advance Directive? No No No No No No   Would patient like information on creating a medical advance directive? No - Patient declined No - Patient declined Yes (Inpatient - patient defers creating a medical advance directive at this time - Information given)    No - Patient declined    Current Medications (verified) Outpatient Encounter Medications as of 03/14/2023  Medication Sig   amLODipine (NORVASC) 10 MG tablet Take 1 tablet (10 mg total) by mouth daily.   atorvastatin (LIPITOR) 40 MG tablet Take 1 tablet (40 mg total) by mouth daily.   blood glucose meter kit and supplies KIT Dispense based on patient and insurance preference. Use up to four times daily as directed. Please include lancets, test strips, control solution.   canagliflozin (INVOKANA) 100 MG TABS tablet Take 1 tablet (100 mg total) by mouth daily before breakfast.   losartan (COZAAR) 100 MG tablet Take 1 tablet (100 mg total) by mouth daily.   No facility-administered encounter medications on  file as of 03/14/2023.    Allergies (verified) Other   History: Past Medical History:  Diagnosis Date   Breast cancer    Diabetes mellitus without complication    Hyperlipidemia    Hypertension    Stroke    Past Surgical History:  Procedure Laterality Date   BREAST LUMPECTOMY     History reviewed. No pertinent family history. Social History   Socioeconomic History   Marital status: Single    Spouse name: Not on file   Number of children: Not on file   Years of education: Not on file   Highest education level: Not on file  Occupational History   Not on file  Tobacco Use   Smoking status: Former   Smokeless tobacco: Never  Substance and Sexual Activity   Alcohol use: No   Drug use: Never   Sexual activity: Not on file  Other Topics Concern   Not on file  Social History Narrative   Not on file   Social Determinants of Health   Financial Resource Strain: Low Risk  (03/14/2023)   Overall Financial Resource Strain (CARDIA)    Difficulty of Paying Living Expenses: Not hard at all  Food Insecurity: No Food Insecurity (03/14/2023)   Hunger Vital Sign    Worried About Running Out of Food in the Last Year: Never true    Ran Out of Food in the Last Year: Never true  Transportation Needs: No Transportation Needs (03/14/2023)  PRAPARE - Administrator, Civil Service (Medical): No    Lack of Transportation (Non-Medical): No  Physical Activity: Inactive (03/14/2023)   Exercise Vital Sign    Days of Exercise per Week: 0 days    Minutes of Exercise per Session: 0 min  Stress: No Stress Concern Present (03/14/2023)   [employer] of Occupational Health - Occupational Stress Questionnaire    Feeling of Stress : Not at all  Social Connections: Socially Isolated (03/14/2023)   Social Connection and Isolation Panel [NHANES]    Frequency of Communication with Friends and Family: Twice a week    Frequency of Social Gatherings with Friends and Family: Once a week     Attends Religious Services: Never    Database administrator or Organizations: No    Attends Engineer, structural: Never    Marital Status: Never married    Tobacco Counseling Counseling given: Not Answered   Clinical Intake:  Pre-visit preparation completed: Yes  Pain : No/denies pain     Nutritional Risks: None Diabetes: Yes CBG done?: No Did pt. bring in CBG monitor from home?: No  How often do you need to have someone help you when you read instructions, pamphlets, or other written materials from your doctor or pharmacy?: 1 - Never  Activities of Daily Living    03/14/2023    1:50 PM 05/19/2022    2:20 PM  In your present state of health, do you have any difficulty performing the following activities:  Hearing? 0 0  Vision? 0 0  Difficulty concentrating or making decisions? 0 1  Walking or climbing stairs? 0 0  Dressing or bathing? 0 0  Doing errands, shopping? 0 1  Preparing Food and eating ? N   Using the Toilet? N   In the past six months, have you accidently leaked urine? N   Do you have problems with loss of bowel control? N   Managing your Medications? N   Managing your Finances? N   Housekeeping or managing your Housekeeping? N     Patient Care Team: Clayborne Dana, NP as PCP - General (Family Medicine)  Indicate any recent Medical Services you may have received from other than [facility] providers in the past year (date may be approximate).     Assessment:   This is a routine wellness examination for Monica Bauer.  Hearing/Vision screen No results found.  Dietary issues and exercise activities discussed: Current Exercise Habits: The patient does not participate in regular exercise at present, Exercise limited by: None identified   Goals Addressed   None    Depression Screen    03/14/2023    1:50 PM 12/07/2022   11:19 AM  PHQ 2/9 Scores  PHQ - 2 Score 0 0    Fall Risk    03/14/2023    1:47 PM  Fall Risk   Falls in the past year? 0   Number falls in past yr: 0  Injury with Fall? 0  Risk for fall due to : No Fall Risks  Follow up Falls evaluation completed    FALL RISK PREVENTION PERTAINING TO THE HOME:  Any stairs in or around the home? Yes  If so, are there any without handrails? No  Home free of loose throw rugs in walkways, pet beds, electrical cords, etc? Yes  Adequate lighting in your home to reduce risk of falls? Yes   ASSISTIVE DEVICES UTILIZED TO PREVENT FALLS:  Life alert? No  Use of  a cane, walker or w/c? No  Grab bars in the bathroom? No  Shower chair or bench in shower? No  Elevated toilet seat or a handicapped toilet? No   TIMED UP AND GO:  Was the test performed?  No, audio visit .    Cognitive Function:    03/14/2023    1:53 PM  MMSE - Mini Mental State Exam  Not completed: Unable to complete      06/14/2022    9:52 AM  Montreal Cognitive Assessment   Visuospatial/ Executive (0/5) 4  Naming (0/3) 3  Attention: Read list of digits (0/2) 2  Attention: Read list of letters (0/1) 1  Attention: Serial 7 subtraction starting at 100 (0/3) 1  Language: Repeat phrase (0/2) 2  Language : Fluency (0/1) 1  Abstraction (0/2) 2  Delayed Recall (0/5) 1  Orientation (0/6) 5  Total 22      Immunizations Immunization History  Administered Date(s) Administered   PFIZER(Purple Top)SARS-COV-2 Vaccination 06/15/2020, 07/06/2020   Pneumococcal Polysaccharide-23 05/15/2018   Pneumococcal-Unspecified 11/22/2011    TDAP status: Due, Education has been provided regarding the importance of this vaccine. Advised may receive this vaccine at local pharmacy or Health Dept. Aware to provide a copy of the vaccination record if obtained from local pharmacy or Health Dept. Verbalized acceptance and understanding.  Flu Vaccine status: Up to date  Pneumococcal vaccine status: Due, Education has been provided regarding the importance of this vaccine. Advised may receive this vaccine at local pharmacy or  Health Dept. Aware to provide a copy of the vaccination record if obtained from local pharmacy or Health Dept. Verbalized acceptance and understanding.  Covid-19 vaccine status: Information provided on how to obtain vaccines.   Qualifies for Shingles Vaccine? Yes   Zostavax completed No   Shingrix Completed?: No.    Education has been provided regarding the importance of this vaccine. Patient has been advised to call insurance company to determine out of pocket expense if they have not yet received this vaccine. Advised may also receive vaccine at local pharmacy or Health Dept. Verbalized acceptance and understanding.  Screening Tests Health Maintenance  Topic Date Due   OPHTHALMOLOGY EXAM  Never done   DTaP/Tdap/Td (1 - Tdap) Never done   Zoster Vaccines- Shingrix (1 of 2) Never done   COLONOSCOPY (Pts 45-71yrs Insurance coverage will need to be confirmed)  Never done   COVID-19 Vaccine (3 - Pfizer risk series) 08/03/2020   Pneumonia Vaccine 39+ Years old (2 of 2 - PCV) 12/24/2020   Medicare Annual Wellness (AWV)  07/21/2021   HEMOGLOBIN A1C  06/07/2023   INFLUENZA VACCINE  06/29/2023   Diabetic kidney evaluation - Urine ACR  12/08/2023   FOOT EXAM  12/08/2023   Diabetic kidney evaluation - eGFR measurement  12/20/2023   MAMMOGRAM  12/30/2023   Hepatitis C Screening  Completed   HPV VACCINES  Aged Out   DEXA SCAN  Discontinued    Health Maintenance  Health Maintenance Due  Topic Date Due   OPHTHALMOLOGY EXAM  Never done   DTaP/Tdap/Td (1 - Tdap) Never done   Zoster Vaccines- Shingrix (1 of 2) Never done   COLONOSCOPY (Pts 45-32yrs Insurance coverage will need to be confirmed)  Never done   COVID-19 Vaccine (3 - Pfizer risk series) 08/03/2020   Pneumonia Vaccine 58+ Years old (2 of 2 - PCV) 12/24/2020   Medicare Annual Wellness (AWV)  07/21/2021    Colorectal cancer screening: Referral to GI placed 12/07/22. Pt  aware the office will call re: appt.  Mammogram status:  Completed 01/02/23. Repeat every year  Bone Density status: pt declined  Lung Cancer Screening: (Low Dose CT Chest recommended if Age 36-80 years, 30 pack-year currently smoking OR have quit w/in 15years.) does not qualify.   Additional Screening:  Hepatitis C Screening: does qualify; Completed 05/23/22  Vision Screening: Recommended annual ophthalmology exams for early detection of glaucoma and other disorders of the eye. Is the patient up to date with their annual eye exam?  Yes  Who is the provider or what is the name of the office in which the patient attends annual eye exams? Doesn't remember name at this time If pt is not established with a provider, would they like to be referred to a provider to establish care? No .   Dental Screening: Recommended annual dental exams for proper oral hygiene  Community Resource Referral / Chronic Care Management: CRR required this visit?  No   CCM required this visit?  No      Plan:     I have personally reviewed and noted the following in the patient's chart:   Medical and social history Use of alcohol, tobacco or illicit drugs  Current medications and supplements including opioid prescriptions. Patient is not currently taking opioid prescriptions. Functional ability and status Nutritional status Physical activity Advanced directives List of other physicians Hospitalizations, surgeries, and ER visits in previous 12 months Vitals Screenings to include cognitive, depression, and falls Referrals and appointments  In addition, I have reviewed and discussed with patient certain preventive protocols, quality metrics, and best practice recommendations. A written personalized care plan for preventive services as well as general preventive health recommendations were provided to patient.   Due to this being a telephonic visit, the after visit summary with patients personalized plan was offered to patient via mail or my-chart. Patient declined  at this time.  Donne Anon, [place]   03/14/2023   Nurse Notes: None

## 2023-03-14 NOTE — Telephone Encounter (Signed)
Contacted Monica Bauer to schedule their annual wellness visit. Appointment made for 03/24/2023.  [name]; Care Guide Ambulatory Clinical Support  l [facility] Direct Dial: [phone]

## 2023-03-14 NOTE — Telephone Encounter (Signed)
Contacted Monica Bauer to schedule their annual wellness visit. Call back at later date: Called patient other number her aunt stated to call the home number again  [name]; Care Guide Ambulatory Clinical Support [place] [facility] Direct Dial: [phone]

## 2023-12-06 ENCOUNTER — Encounter: Payer: Self-pay | Admitting: Neurology

## 2023-12-06 ENCOUNTER — Ambulatory Visit: Payer: Medicare Other | Admitting: Neurology

## 2024-09-09 ENCOUNTER — Telehealth: Payer: Self-pay | Admitting: Neurology

## 2024-09-09 NOTE — Telephone Encounter (Signed)
 Received request, this is for some type of genetic testing lab. We have not seen patient since January 2024. Per [place] patient needs follow up appt. Note faxed back that we will not complete paperwork at this time and that patient overdue for appt.

## 2024-09-09 NOTE — Telephone Encounter (Signed)
 Copied from CRM [id number]. Topic: Clinical - Request for Lab/Test Order >> Sep 09, 2024 11:34 AM Charolett L wrote: Reason for CRM: agent from [employer] stated lab requisition form were faxed to the office today and needs them signed urgently and faxed back
